# Patient Record
Sex: Male | Born: 1935 | Race: White | Hispanic: No | Marital: Married | Smoking: Former smoker
Health system: Southern US, Community
[De-identification: ages and names within clinical notes are randomized; demographics above are authoritative.]

## PROBLEM LIST (undated history)

## (undated) DIAGNOSIS — K219 Gastro-esophageal reflux disease without esophagitis: Secondary | ICD-10-CM

## (undated) DIAGNOSIS — N401 Enlarged prostate with lower urinary tract symptoms: Secondary | ICD-10-CM

## (undated) DIAGNOSIS — J449 Chronic obstructive pulmonary disease, unspecified: Secondary | ICD-10-CM

## (undated) DIAGNOSIS — M419 Scoliosis, unspecified: Secondary | ICD-10-CM

## (undated) DIAGNOSIS — M954 Acquired deformity of chest and rib: Secondary | ICD-10-CM

## (undated) DIAGNOSIS — I10 Essential (primary) hypertension: Secondary | ICD-10-CM

## (undated) DIAGNOSIS — J441 Chronic obstructive pulmonary disease with (acute) exacerbation: Secondary | ICD-10-CM

## (undated) DIAGNOSIS — I6350 Cerebral infarction due to unspecified occlusion or stenosis of unspecified cerebral artery: Secondary | ICD-10-CM

## (undated) DIAGNOSIS — I2510 Atherosclerotic heart disease of native coronary artery without angina pectoris: Secondary | ICD-10-CM

## (undated) DIAGNOSIS — I4891 Unspecified atrial fibrillation: Secondary | ICD-10-CM

## (undated) DIAGNOSIS — R269 Unspecified abnormalities of gait and mobility: Secondary | ICD-10-CM

## (undated) DIAGNOSIS — M1990 Unspecified osteoarthritis, unspecified site: Secondary | ICD-10-CM

## (undated) DIAGNOSIS — R5383 Other fatigue: Secondary | ICD-10-CM

## (undated) DIAGNOSIS — I739 Peripheral vascular disease, unspecified: Secondary | ICD-10-CM

## (undated) DIAGNOSIS — M4800 Spinal stenosis, site unspecified: Secondary | ICD-10-CM

## (undated) DIAGNOSIS — E039 Hypothyroidism, unspecified: Secondary | ICD-10-CM

## (undated) DIAGNOSIS — K279 Peptic ulcer, site unspecified, unspecified as acute or chronic, without hemorrhage or perforation: Secondary | ICD-10-CM

## (undated) DIAGNOSIS — I5022 Chronic systolic (congestive) heart failure: Secondary | ICD-10-CM

## (undated) DIAGNOSIS — R5381 Other malaise: Secondary | ICD-10-CM

## (undated) HISTORY — DX: Chronic obstructive pulmonary disease with (acute) exacerbation: J44.1

## (undated) HISTORY — DX: Gastro-esophageal reflux disease without esophagitis: K21.9

## (undated) HISTORY — DX: Acquired deformity of chest and rib: M95.4

## (undated) HISTORY — DX: Chronic systolic (congestive) heart failure: I50.22

## (undated) HISTORY — DX: Spinal stenosis, site unspecified: M48.00

## (undated) HISTORY — DX: Peripheral vascular disease, unspecified: I73.9

## (undated) HISTORY — PX: APPENDECTOMY: SHX54

## (undated) HISTORY — PX: TRANSURETHRAL RESECTION OF PROSTATE: SHX73

## (undated) HISTORY — DX: Other malaise: R53.81

## (undated) HISTORY — PX: TONSILLECTOMY: SHX5217

## (undated) HISTORY — DX: Unspecified osteoarthritis, unspecified site: M19.90

## (undated) HISTORY — DX: Peptic ulcer, site unspecified, unspecified as acute or chronic, without hemorrhage or perforation: K27.9

## (undated) HISTORY — DX: Essential (primary) hypertension: I10

## (undated) HISTORY — DX: Other fatigue: R53.83

## (undated) HISTORY — PX: HERNIA REPAIR: SHX51

## (undated) HISTORY — DX: Benign prostatic hyperplasia with lower urinary tract symptoms: N40.1

## (undated) HISTORY — DX: Hypothyroidism, unspecified: E03.9

## (undated) HISTORY — DX: Scoliosis, unspecified: M41.9

## (undated) HISTORY — PX: LUMBAR LAMINECTOMY: SHX95

## (undated) HISTORY — DX: Unspecified abnormalities of gait and mobility: R26.9

## (undated) HISTORY — DX: Atherosclerotic heart disease of native coronary artery without angina pectoris: I25.10

## (undated) HISTORY — DX: Cerebral infarction due to unspecified occlusion or stenosis of unspecified cerebral artery: I63.50

## (undated) HISTORY — DX: Chronic obstructive pulmonary disease, unspecified: J44.9

## (undated) HISTORY — DX: Unspecified atrial fibrillation: I48.91

## (undated) SURGERY — Surgical Case
Anesthesia: *Unknown

---

## 2000-04-06 HISTORY — PX: ABDOMINAL AORTIC ANEURYSM REPAIR: SHX42

## 2003-03-28 ENCOUNTER — Emergency Department (HOSPITAL_COMMUNITY): Admission: EM | Admit: 2003-03-28 | Discharge: 2003-03-28 | Payer: Self-pay | Admitting: Emergency Medicine

## 2003-04-07 HISTORY — PX: CARDIAC CATHETERIZATION: SHX172

## 2003-08-29 ENCOUNTER — Inpatient Hospital Stay (HOSPITAL_COMMUNITY): Admission: AD | Admit: 2003-08-29 | Discharge: 2003-09-01 | Payer: Self-pay | Admitting: Internal Medicine

## 2003-09-03 ENCOUNTER — Inpatient Hospital Stay (HOSPITAL_COMMUNITY): Admission: EM | Admit: 2003-09-03 | Discharge: 2003-09-06 | Payer: Self-pay | Admitting: *Deleted

## 2003-11-08 ENCOUNTER — Inpatient Hospital Stay (HOSPITAL_COMMUNITY): Admission: EM | Admit: 2003-11-08 | Discharge: 2003-11-10 | Payer: Self-pay

## 2004-02-13 ENCOUNTER — Ambulatory Visit: Payer: Self-pay | Admitting: Internal Medicine

## 2004-03-11 ENCOUNTER — Ambulatory Visit: Payer: Self-pay | Admitting: Internal Medicine

## 2004-03-25 ENCOUNTER — Ambulatory Visit: Payer: Self-pay | Admitting: Internal Medicine

## 2004-04-09 ENCOUNTER — Ambulatory Visit: Payer: Self-pay | Admitting: Internal Medicine

## 2004-04-18 ENCOUNTER — Ambulatory Visit: Payer: Self-pay | Admitting: Internal Medicine

## 2004-05-07 ENCOUNTER — Ambulatory Visit: Payer: Self-pay | Admitting: Internal Medicine

## 2004-06-05 ENCOUNTER — Ambulatory Visit: Payer: Self-pay | Admitting: Internal Medicine

## 2004-06-09 ENCOUNTER — Ambulatory Visit: Payer: Self-pay | Admitting: Internal Medicine

## 2004-07-01 ENCOUNTER — Ambulatory Visit: Payer: Self-pay | Admitting: Internal Medicine

## 2004-07-16 ENCOUNTER — Ambulatory Visit: Payer: Self-pay | Admitting: Internal Medicine

## 2004-08-05 ENCOUNTER — Ambulatory Visit: Payer: Self-pay | Admitting: Internal Medicine

## 2004-08-15 ENCOUNTER — Ambulatory Visit: Payer: Self-pay | Admitting: Internal Medicine

## 2004-08-18 ENCOUNTER — Ambulatory Visit: Payer: Self-pay | Admitting: Internal Medicine

## 2004-08-19 ENCOUNTER — Ambulatory Visit: Payer: Self-pay | Admitting: Internal Medicine

## 2004-08-28 ENCOUNTER — Ambulatory Visit: Payer: Self-pay | Admitting: Internal Medicine

## 2004-09-04 ENCOUNTER — Ambulatory Visit: Payer: Self-pay | Admitting: Internal Medicine

## 2004-09-15 ENCOUNTER — Ambulatory Visit: Payer: Self-pay | Admitting: Internal Medicine

## 2004-10-17 ENCOUNTER — Ambulatory Visit: Payer: Self-pay | Admitting: Internal Medicine

## 2004-10-23 ENCOUNTER — Ambulatory Visit: Payer: Self-pay | Admitting: Internal Medicine

## 2004-11-12 ENCOUNTER — Ambulatory Visit: Payer: Self-pay | Admitting: Internal Medicine

## 2004-11-19 ENCOUNTER — Ambulatory Visit: Payer: Self-pay | Admitting: Internal Medicine

## 2004-12-22 ENCOUNTER — Ambulatory Visit: Payer: Self-pay | Admitting: Internal Medicine

## 2005-01-09 ENCOUNTER — Ambulatory Visit: Payer: Self-pay | Admitting: Internal Medicine

## 2005-01-13 ENCOUNTER — Ambulatory Visit: Payer: Self-pay | Admitting: Internal Medicine

## 2005-02-05 ENCOUNTER — Ambulatory Visit: Payer: Self-pay | Admitting: Internal Medicine

## 2005-03-03 ENCOUNTER — Ambulatory Visit: Payer: Self-pay | Admitting: Internal Medicine

## 2005-03-16 ENCOUNTER — Ambulatory Visit: Payer: Self-pay | Admitting: Internal Medicine

## 2005-03-20 ENCOUNTER — Ambulatory Visit: Payer: Self-pay | Admitting: Family Medicine

## 2005-03-20 ENCOUNTER — Encounter: Payer: Self-pay | Admitting: Cardiology

## 2005-03-20 ENCOUNTER — Inpatient Hospital Stay (HOSPITAL_COMMUNITY): Admission: EM | Admit: 2005-03-20 | Discharge: 2005-03-21 | Payer: Self-pay | Admitting: Emergency Medicine

## 2005-03-20 ENCOUNTER — Ambulatory Visit: Payer: Self-pay | Admitting: Internal Medicine

## 2005-03-20 ENCOUNTER — Ambulatory Visit: Payer: Self-pay | Admitting: Cardiology

## 2005-03-24 ENCOUNTER — Ambulatory Visit: Payer: Self-pay | Admitting: Internal Medicine

## 2005-04-03 ENCOUNTER — Ambulatory Visit: Payer: Self-pay | Admitting: Internal Medicine

## 2005-04-07 ENCOUNTER — Ambulatory Visit: Payer: Self-pay | Admitting: Internal Medicine

## 2005-04-09 ENCOUNTER — Ambulatory Visit: Payer: Self-pay | Admitting: Internal Medicine

## 2005-04-14 ENCOUNTER — Ambulatory Visit: Payer: Self-pay | Admitting: Internal Medicine

## 2005-04-24 ENCOUNTER — Ambulatory Visit: Payer: Self-pay | Admitting: Internal Medicine

## 2005-04-29 ENCOUNTER — Ambulatory Visit: Payer: Self-pay | Admitting: Internal Medicine

## 2005-06-04 ENCOUNTER — Ambulatory Visit: Payer: Self-pay | Admitting: Internal Medicine

## 2005-07-03 ENCOUNTER — Ambulatory Visit: Payer: Self-pay | Admitting: Internal Medicine

## 2005-07-17 ENCOUNTER — Ambulatory Visit: Payer: Self-pay | Admitting: Internal Medicine

## 2005-08-04 ENCOUNTER — Ambulatory Visit: Payer: Self-pay | Admitting: Internal Medicine

## 2005-08-06 ENCOUNTER — Ambulatory Visit: Payer: Self-pay | Admitting: Internal Medicine

## 2005-08-25 ENCOUNTER — Ambulatory Visit: Payer: Self-pay | Admitting: Internal Medicine

## 2005-08-25 ENCOUNTER — Emergency Department (HOSPITAL_COMMUNITY): Admission: EM | Admit: 2005-08-25 | Discharge: 2005-08-25 | Payer: Self-pay | Admitting: Emergency Medicine

## 2005-08-28 ENCOUNTER — Ambulatory Visit: Payer: Self-pay | Admitting: Internal Medicine

## 2005-09-29 ENCOUNTER — Ambulatory Visit: Payer: Self-pay | Admitting: Internal Medicine

## 2005-10-27 ENCOUNTER — Ambulatory Visit: Payer: Self-pay | Admitting: Internal Medicine

## 2005-11-05 ENCOUNTER — Ambulatory Visit: Payer: Self-pay | Admitting: Family Medicine

## 2005-11-06 ENCOUNTER — Encounter: Admission: RE | Admit: 2005-11-06 | Discharge: 2005-11-06 | Payer: Self-pay | Admitting: Family Medicine

## 2005-11-23 ENCOUNTER — Ambulatory Visit: Payer: Self-pay | Admitting: Internal Medicine

## 2005-12-24 ENCOUNTER — Ambulatory Visit: Payer: Self-pay | Admitting: Internal Medicine

## 2006-01-14 ENCOUNTER — Ambulatory Visit: Payer: Self-pay | Admitting: Internal Medicine

## 2006-02-16 ENCOUNTER — Ambulatory Visit: Payer: Self-pay | Admitting: Internal Medicine

## 2006-03-02 ENCOUNTER — Ambulatory Visit: Payer: Self-pay | Admitting: Internal Medicine

## 2006-03-16 ENCOUNTER — Ambulatory Visit: Payer: Self-pay | Admitting: Internal Medicine

## 2006-05-10 ENCOUNTER — Ambulatory Visit: Payer: Self-pay | Admitting: Internal Medicine

## 2006-05-24 ENCOUNTER — Ambulatory Visit: Payer: Self-pay | Admitting: Internal Medicine

## 2006-06-23 ENCOUNTER — Ambulatory Visit: Payer: Self-pay | Admitting: Internal Medicine

## 2006-07-02 ENCOUNTER — Ambulatory Visit: Payer: Self-pay | Admitting: Internal Medicine

## 2006-07-02 LAB — CONVERTED CEMR LAB
ALT: 26 units/L (ref 0–40)
Albumin: 3.4 g/dL — ABNORMAL LOW (ref 3.5–5.2)
Alkaline Phosphatase: 65 units/L (ref 39–117)
BUN: 14 mg/dL (ref 6–23)
Basophils Absolute: 0 10*3/uL (ref 0.0–0.1)
Basophils Relative: 0.7 % (ref 0.0–1.0)
CO2: 34 meq/L — ABNORMAL HIGH (ref 19–32)
Calcium: 9 mg/dL (ref 8.4–10.5)
Creatinine, Ser: 0.7 mg/dL (ref 0.4–1.5)
GFR calc Af Amer: 138 mL/min
MCHC: 34.3 g/dL (ref 30.0–36.0)
Monocytes Relative: 8.1 % (ref 3.0–11.0)
Platelets: 156 10*3/uL (ref 150–400)
Potassium: 4.1 meq/L (ref 3.5–5.1)
RDW: 13.3 % (ref 11.5–14.6)
Total Bilirubin: 0.9 mg/dL (ref 0.3–1.2)
Total Protein: 6.2 g/dL (ref 6.0–8.3)

## 2006-07-16 ENCOUNTER — Ambulatory Visit: Payer: Self-pay | Admitting: Family Medicine

## 2006-07-21 ENCOUNTER — Ambulatory Visit: Payer: Self-pay | Admitting: Internal Medicine

## 2006-07-27 ENCOUNTER — Ambulatory Visit: Payer: Self-pay | Admitting: Internal Medicine

## 2006-08-26 ENCOUNTER — Ambulatory Visit: Payer: Self-pay | Admitting: Internal Medicine

## 2006-09-07 ENCOUNTER — Encounter: Admission: RE | Admit: 2006-09-07 | Discharge: 2006-10-06 | Payer: Self-pay | Admitting: Internal Medicine

## 2006-09-30 ENCOUNTER — Ambulatory Visit: Payer: Self-pay | Admitting: Internal Medicine

## 2006-09-30 ENCOUNTER — Encounter: Payer: Self-pay | Admitting: Internal Medicine

## 2006-09-30 DIAGNOSIS — J449 Chronic obstructive pulmonary disease, unspecified: Secondary | ICD-10-CM

## 2006-09-30 DIAGNOSIS — K279 Peptic ulcer, site unspecified, unspecified as acute or chronic, without hemorrhage or perforation: Secondary | ICD-10-CM | POA: Insufficient documentation

## 2006-09-30 DIAGNOSIS — I4891 Unspecified atrial fibrillation: Secondary | ICD-10-CM

## 2006-09-30 DIAGNOSIS — I10 Essential (primary) hypertension: Secondary | ICD-10-CM | POA: Insufficient documentation

## 2006-09-30 DIAGNOSIS — I739 Peripheral vascular disease, unspecified: Secondary | ICD-10-CM

## 2006-09-30 DIAGNOSIS — E039 Hypothyroidism, unspecified: Secondary | ICD-10-CM | POA: Insufficient documentation

## 2006-09-30 DIAGNOSIS — M1990 Unspecified osteoarthritis, unspecified site: Secondary | ICD-10-CM | POA: Insufficient documentation

## 2006-09-30 DIAGNOSIS — J4489 Other specified chronic obstructive pulmonary disease: Secondary | ICD-10-CM | POA: Insufficient documentation

## 2006-09-30 HISTORY — DX: Chronic obstructive pulmonary disease, unspecified: J44.9

## 2006-09-30 HISTORY — DX: Other specified chronic obstructive pulmonary disease: J44.89

## 2006-09-30 HISTORY — DX: Hypothyroidism, unspecified: E03.9

## 2006-09-30 HISTORY — DX: Peptic ulcer, site unspecified, unspecified as acute or chronic, without hemorrhage or perforation: K27.9

## 2006-09-30 HISTORY — DX: Unspecified osteoarthritis, unspecified site: M19.90

## 2006-09-30 HISTORY — DX: Peripheral vascular disease, unspecified: I73.9

## 2006-09-30 HISTORY — DX: Unspecified atrial fibrillation: I48.91

## 2006-09-30 HISTORY — DX: Essential (primary) hypertension: I10

## 2006-10-01 DIAGNOSIS — I2510 Atherosclerotic heart disease of native coronary artery without angina pectoris: Secondary | ICD-10-CM | POA: Insufficient documentation

## 2006-10-01 HISTORY — DX: Atherosclerotic heart disease of native coronary artery without angina pectoris: I25.10

## 2006-10-11 ENCOUNTER — Encounter: Payer: Self-pay | Admitting: Internal Medicine

## 2006-10-11 ENCOUNTER — Ambulatory Visit: Payer: Self-pay | Admitting: Internal Medicine

## 2006-11-02 ENCOUNTER — Ambulatory Visit: Payer: Self-pay | Admitting: Internal Medicine

## 2006-11-02 DIAGNOSIS — R300 Dysuria: Secondary | ICD-10-CM | POA: Insufficient documentation

## 2006-11-02 LAB — CONVERTED CEMR LAB
Bilirubin Urine: NEGATIVE
Glucose, Urine, Semiquant: NEGATIVE
Protein, U semiquant: NEGATIVE
Urobilinogen, UA: 0.2
pH: 6.5

## 2006-11-16 ENCOUNTER — Telehealth: Payer: Self-pay | Admitting: Internal Medicine

## 2006-12-02 ENCOUNTER — Encounter: Payer: Self-pay | Admitting: Internal Medicine

## 2006-12-15 ENCOUNTER — Ambulatory Visit: Payer: Self-pay | Admitting: Internal Medicine

## 2007-01-11 ENCOUNTER — Ambulatory Visit: Payer: Self-pay | Admitting: Internal Medicine

## 2007-01-11 DIAGNOSIS — M549 Dorsalgia, unspecified: Secondary | ICD-10-CM | POA: Insufficient documentation

## 2007-02-23 ENCOUNTER — Ambulatory Visit: Payer: Self-pay | Admitting: Internal Medicine

## 2007-03-24 ENCOUNTER — Ambulatory Visit: Payer: Self-pay | Admitting: Internal Medicine

## 2007-03-24 DIAGNOSIS — J441 Chronic obstructive pulmonary disease with (acute) exacerbation: Secondary | ICD-10-CM

## 2007-03-24 HISTORY — DX: Chronic obstructive pulmonary disease with (acute) exacerbation: J44.1

## 2007-04-19 ENCOUNTER — Ambulatory Visit: Payer: Self-pay | Admitting: Internal Medicine

## 2007-04-19 DIAGNOSIS — K219 Gastro-esophageal reflux disease without esophagitis: Secondary | ICD-10-CM

## 2007-04-19 HISTORY — DX: Gastro-esophageal reflux disease without esophagitis: K21.9

## 2007-04-25 ENCOUNTER — Encounter: Payer: Self-pay | Admitting: Internal Medicine

## 2007-05-24 ENCOUNTER — Ambulatory Visit: Payer: Self-pay | Admitting: Internal Medicine

## 2007-06-13 ENCOUNTER — Ambulatory Visit: Payer: Self-pay | Admitting: Internal Medicine

## 2007-06-13 DIAGNOSIS — R5381 Other malaise: Secondary | ICD-10-CM

## 2007-06-13 DIAGNOSIS — R5383 Other fatigue: Secondary | ICD-10-CM

## 2007-06-13 HISTORY — DX: Other malaise: R53.81

## 2007-07-11 ENCOUNTER — Ambulatory Visit: Payer: Self-pay | Admitting: Internal Medicine

## 2007-07-25 ENCOUNTER — Ambulatory Visit: Payer: Self-pay | Admitting: Internal Medicine

## 2007-08-30 ENCOUNTER — Telehealth: Payer: Self-pay | Admitting: Internal Medicine

## 2007-08-30 ENCOUNTER — Ambulatory Visit: Payer: Self-pay | Admitting: Internal Medicine

## 2007-09-26 ENCOUNTER — Ambulatory Visit: Payer: Self-pay | Admitting: Internal Medicine

## 2007-09-26 DIAGNOSIS — J069 Acute upper respiratory infection, unspecified: Secondary | ICD-10-CM | POA: Insufficient documentation

## 2007-12-23 ENCOUNTER — Ambulatory Visit: Payer: Self-pay | Admitting: Internal Medicine

## 2007-12-30 ENCOUNTER — Ambulatory Visit: Payer: Self-pay | Admitting: Internal Medicine

## 2008-01-13 ENCOUNTER — Ambulatory Visit: Payer: Self-pay | Admitting: Internal Medicine

## 2008-01-31 ENCOUNTER — Ambulatory Visit: Payer: Self-pay | Admitting: Internal Medicine

## 2008-02-21 ENCOUNTER — Telehealth: Payer: Self-pay | Admitting: Internal Medicine

## 2008-02-24 ENCOUNTER — Telehealth: Payer: Self-pay | Admitting: Internal Medicine

## 2008-02-29 ENCOUNTER — Ambulatory Visit: Payer: Self-pay | Admitting: Internal Medicine

## 2008-03-19 ENCOUNTER — Telehealth: Payer: Self-pay | Admitting: Internal Medicine

## 2008-03-20 ENCOUNTER — Ambulatory Visit: Payer: Self-pay | Admitting: Internal Medicine

## 2008-03-21 ENCOUNTER — Encounter: Payer: Self-pay | Admitting: Internal Medicine

## 2008-03-28 ENCOUNTER — Ambulatory Visit: Payer: Self-pay | Admitting: Internal Medicine

## 2008-04-02 ENCOUNTER — Ambulatory Visit: Payer: Self-pay | Admitting: Family Medicine

## 2008-04-17 ENCOUNTER — Ambulatory Visit: Payer: Self-pay | Admitting: Internal Medicine

## 2008-04-17 DIAGNOSIS — M25529 Pain in unspecified elbow: Secondary | ICD-10-CM

## 2008-04-20 ENCOUNTER — Ambulatory Visit: Payer: Self-pay | Admitting: Internal Medicine

## 2008-04-23 ENCOUNTER — Ambulatory Visit: Payer: Self-pay | Admitting: Internal Medicine

## 2008-04-23 LAB — CONVERTED CEMR LAB
ALT: 17 units/L (ref 0–53)
AST: 20 units/L (ref 0–37)
BUN: 21 mg/dL (ref 6–23)
Basophils Absolute: 0 10*3/uL (ref 0.0–0.1)
Basophils Relative: 0.7 % (ref 0.0–3.0)
CO2: 33 meq/L — ABNORMAL HIGH (ref 19–32)
Calcium: 9 mg/dL (ref 8.4–10.5)
Chloride: 100 meq/L (ref 96–112)
Creatinine, Ser: 0.7 mg/dL (ref 0.4–1.5)
Eosinophils Relative: 1.3 % (ref 0.0–5.0)
Glucose, Bld: 94 mg/dL (ref 70–99)
Hemoglobin: 14.4 g/dL (ref 13.0–17.0)
Lymphocytes Relative: 28.7 % (ref 12.0–46.0)
MCHC: 34.6 g/dL (ref 30.0–36.0)
Monocytes Relative: 7.5 % (ref 3.0–12.0)
Neutro Abs: 3.3 10*3/uL (ref 1.4–7.7)
Neutrophils Relative %: 61.8 % (ref 43.0–77.0)
RBC: 4.93 M/uL (ref 4.22–5.81)
TSH: 2.59 microintl units/mL (ref 0.35–5.50)
Total Bilirubin: 0.8 mg/dL (ref 0.3–1.2)
Total Protein: 6.1 g/dL (ref 6.0–8.3)
WBC: 5.3 10*3/uL (ref 4.5–10.5)

## 2008-04-24 ENCOUNTER — Telehealth: Payer: Self-pay | Admitting: Internal Medicine

## 2008-04-25 ENCOUNTER — Ambulatory Visit: Payer: Self-pay | Admitting: Internal Medicine

## 2008-06-01 ENCOUNTER — Ambulatory Visit: Payer: Self-pay | Admitting: Internal Medicine

## 2008-06-12 ENCOUNTER — Ambulatory Visit: Payer: Self-pay | Admitting: Internal Medicine

## 2008-06-18 ENCOUNTER — Encounter: Admission: RE | Admit: 2008-06-18 | Discharge: 2008-06-18 | Payer: Self-pay | Admitting: Family Medicine

## 2008-06-28 ENCOUNTER — Ambulatory Visit: Payer: Self-pay | Admitting: Internal Medicine

## 2008-07-03 ENCOUNTER — Ambulatory Visit: Payer: Self-pay | Admitting: Internal Medicine

## 2008-07-06 ENCOUNTER — Ambulatory Visit: Payer: Self-pay | Admitting: Cardiology

## 2008-07-06 ENCOUNTER — Ambulatory Visit: Payer: Self-pay | Admitting: Internal Medicine

## 2008-07-06 ENCOUNTER — Telehealth: Payer: Self-pay | Admitting: Internal Medicine

## 2008-07-06 ENCOUNTER — Inpatient Hospital Stay (HOSPITAL_COMMUNITY): Admission: EM | Admit: 2008-07-06 | Discharge: 2008-07-13 | Payer: Self-pay | Admitting: Emergency Medicine

## 2008-07-09 ENCOUNTER — Encounter: Payer: Self-pay | Admitting: Internal Medicine

## 2008-07-13 ENCOUNTER — Encounter: Payer: Self-pay | Admitting: Internal Medicine

## 2008-07-24 ENCOUNTER — Telehealth: Payer: Self-pay | Admitting: Internal Medicine

## 2008-07-27 ENCOUNTER — Ambulatory Visit: Payer: Self-pay | Admitting: Internal Medicine

## 2008-08-03 ENCOUNTER — Telehealth: Payer: Self-pay | Admitting: Family Medicine

## 2008-08-06 ENCOUNTER — Encounter: Payer: Self-pay | Admitting: Internal Medicine

## 2008-08-06 ENCOUNTER — Inpatient Hospital Stay (HOSPITAL_COMMUNITY): Admission: EM | Admit: 2008-08-06 | Discharge: 2008-08-09 | Payer: Self-pay | Admitting: Emergency Medicine

## 2008-08-06 ENCOUNTER — Ambulatory Visit: Payer: Self-pay | Admitting: Internal Medicine

## 2008-08-06 DIAGNOSIS — K5669 Other intestinal obstruction: Secondary | ICD-10-CM

## 2008-08-06 DIAGNOSIS — I5022 Chronic systolic (congestive) heart failure: Secondary | ICD-10-CM | POA: Insufficient documentation

## 2008-08-06 HISTORY — DX: Chronic systolic (congestive) heart failure: I50.22

## 2008-08-07 ENCOUNTER — Encounter: Payer: Self-pay | Admitting: Internal Medicine

## 2008-08-11 ENCOUNTER — Ambulatory Visit: Payer: Self-pay | Admitting: Internal Medicine

## 2008-08-11 DIAGNOSIS — R0602 Shortness of breath: Secondary | ICD-10-CM | POA: Insufficient documentation

## 2008-08-13 ENCOUNTER — Ambulatory Visit: Payer: Self-pay | Admitting: Internal Medicine

## 2008-08-14 ENCOUNTER — Ambulatory Visit: Payer: Self-pay | Admitting: Internal Medicine

## 2008-08-20 ENCOUNTER — Encounter (INDEPENDENT_AMBULATORY_CARE_PROVIDER_SITE_OTHER): Payer: Self-pay | Admitting: Internal Medicine

## 2008-08-20 ENCOUNTER — Encounter: Payer: Self-pay | Admitting: Emergency Medicine

## 2008-08-20 ENCOUNTER — Telehealth: Payer: Self-pay | Admitting: Internal Medicine

## 2008-08-20 ENCOUNTER — Inpatient Hospital Stay (HOSPITAL_COMMUNITY): Admission: EM | Admit: 2008-08-20 | Discharge: 2008-08-23 | Payer: Self-pay | Admitting: Internal Medicine

## 2008-08-20 ENCOUNTER — Ambulatory Visit: Payer: Self-pay | Admitting: Internal Medicine

## 2008-08-20 ENCOUNTER — Ambulatory Visit: Payer: Self-pay | Admitting: Cardiovascular Disease

## 2008-08-21 ENCOUNTER — Encounter (INDEPENDENT_AMBULATORY_CARE_PROVIDER_SITE_OTHER): Payer: Self-pay | Admitting: Internal Medicine

## 2008-08-22 ENCOUNTER — Encounter: Payer: Self-pay | Admitting: Cardiology

## 2008-08-22 ENCOUNTER — Encounter: Payer: Self-pay | Admitting: Internal Medicine

## 2008-08-23 ENCOUNTER — Encounter: Payer: Self-pay | Admitting: Internal Medicine

## 2008-08-28 ENCOUNTER — Ambulatory Visit: Payer: Self-pay | Admitting: Internal Medicine

## 2008-08-28 DIAGNOSIS — I6350 Cerebral infarction due to unspecified occlusion or stenosis of unspecified cerebral artery: Secondary | ICD-10-CM

## 2008-08-28 HISTORY — DX: Cerebral infarction due to unspecified occlusion or stenosis of unspecified cerebral artery: I63.50

## 2008-08-31 ENCOUNTER — Ambulatory Visit: Payer: Self-pay | Admitting: Internal Medicine

## 2008-08-31 LAB — CONVERTED CEMR LAB: Prothrombin Time: 12.3 s

## 2008-09-07 ENCOUNTER — Ambulatory Visit: Payer: Self-pay | Admitting: Internal Medicine

## 2008-09-07 LAB — CONVERTED CEMR LAB: INR: 4.7

## 2008-09-14 ENCOUNTER — Ambulatory Visit: Payer: Self-pay | Admitting: Internal Medicine

## 2008-09-21 ENCOUNTER — Encounter: Payer: Self-pay | Admitting: Internal Medicine

## 2008-09-25 ENCOUNTER — Encounter: Payer: Self-pay | Admitting: Internal Medicine

## 2008-09-28 ENCOUNTER — Ambulatory Visit: Payer: Self-pay | Admitting: Internal Medicine

## 2008-09-28 DIAGNOSIS — R269 Unspecified abnormalities of gait and mobility: Secondary | ICD-10-CM

## 2008-09-28 HISTORY — DX: Unspecified abnormalities of gait and mobility: R26.9

## 2008-10-02 ENCOUNTER — Encounter: Payer: Self-pay | Admitting: Internal Medicine

## 2008-10-02 ENCOUNTER — Telehealth: Payer: Self-pay | Admitting: Internal Medicine

## 2008-10-12 ENCOUNTER — Encounter: Payer: Self-pay | Admitting: Internal Medicine

## 2008-10-18 ENCOUNTER — Ambulatory Visit: Payer: Self-pay | Admitting: Internal Medicine

## 2008-10-18 LAB — CONVERTED CEMR LAB
INR: 1.8
Prothrombin Time: 16.4 s

## 2008-10-19 ENCOUNTER — Encounter: Payer: Self-pay | Admitting: Internal Medicine

## 2008-10-24 ENCOUNTER — Ambulatory Visit: Payer: Self-pay | Admitting: Internal Medicine

## 2008-10-30 ENCOUNTER — Encounter: Payer: Self-pay | Admitting: Internal Medicine

## 2008-11-07 ENCOUNTER — Ambulatory Visit: Payer: Self-pay | Admitting: Internal Medicine

## 2008-11-07 LAB — CONVERTED CEMR LAB: Prothrombin Time: 16.7 s

## 2008-11-20 ENCOUNTER — Ambulatory Visit: Payer: Self-pay | Admitting: Internal Medicine

## 2008-11-23 ENCOUNTER — Ambulatory Visit: Payer: Self-pay | Admitting: Internal Medicine

## 2008-11-23 LAB — CONVERTED CEMR LAB
BUN: 21 mg/dL (ref 6–23)
Calcium: 8.7 mg/dL (ref 8.4–10.5)
Glucose, Bld: 96 mg/dL (ref 70–99)
Sodium: 137 meq/L (ref 135–145)

## 2008-11-26 ENCOUNTER — Ambulatory Visit: Payer: Self-pay | Admitting: Family Medicine

## 2008-11-26 ENCOUNTER — Ambulatory Visit: Payer: Self-pay | Admitting: Cardiovascular Disease

## 2008-11-26 DIAGNOSIS — R51 Headache: Secondary | ICD-10-CM | POA: Insufficient documentation

## 2008-11-26 DIAGNOSIS — R519 Headache, unspecified: Secondary | ICD-10-CM | POA: Insufficient documentation

## 2008-12-07 ENCOUNTER — Ambulatory Visit: Payer: Self-pay | Admitting: Internal Medicine

## 2008-12-07 LAB — CONVERTED CEMR LAB
INR: 2.5
Prothrombin Time: 19.3 s

## 2008-12-11 ENCOUNTER — Encounter: Payer: Self-pay | Admitting: Internal Medicine

## 2009-01-02 ENCOUNTER — Ambulatory Visit: Payer: Self-pay | Admitting: Internal Medicine

## 2009-01-14 ENCOUNTER — Ambulatory Visit: Payer: Self-pay | Admitting: Internal Medicine

## 2009-01-14 DIAGNOSIS — M954 Acquired deformity of chest and rib: Secondary | ICD-10-CM

## 2009-01-14 HISTORY — DX: Acquired deformity of chest and rib: M95.4

## 2009-01-17 ENCOUNTER — Ambulatory Visit: Payer: Self-pay | Admitting: Internal Medicine

## 2009-02-12 ENCOUNTER — Ambulatory Visit: Payer: Self-pay | Admitting: Internal Medicine

## 2009-02-12 DIAGNOSIS — R110 Nausea: Secondary | ICD-10-CM

## 2009-02-12 LAB — CONVERTED CEMR LAB
Albumin: 3.4 g/dL — ABNORMAL LOW (ref 3.5–5.2)
Alkaline Phosphatase: 68 units/L (ref 39–117)
Basophils Absolute: 0 10*3/uL (ref 0.0–0.1)
Bilirubin, Direct: 0.1 mg/dL (ref 0.0–0.3)
CO2: 36 meq/L — ABNORMAL HIGH (ref 19–32)
Calcium: 9.1 mg/dL (ref 8.4–10.5)
Creatinine, Ser: 0.9 mg/dL (ref 0.4–1.5)
Eosinophils Absolute: 0.2 10*3/uL (ref 0.0–0.7)
GFR calc non Af Amer: 84.46 mL/min (ref >60)
Glucose, Bld: 103 mg/dL — ABNORMAL HIGH (ref 70–99)
INR: 2.1
Lymphocytes Relative: 32.5 % (ref 12.0–46.0)
MCHC: 33.8 g/dL (ref 30.0–36.0)
Neutrophils Relative %: 54.8 % (ref 43.0–77.0)
RDW: 15.4 % — ABNORMAL HIGH (ref 11.5–14.6)
Total Bilirubin: 0.9 mg/dL (ref 0.3–1.2)

## 2009-02-26 ENCOUNTER — Ambulatory Visit: Payer: Self-pay | Admitting: Internal Medicine

## 2009-02-26 LAB — CONVERTED CEMR LAB
CO2: 34 meq/L — ABNORMAL HIGH (ref 19–32)
Chloride: 101 meq/L (ref 96–112)
GFR calc non Af Amer: 96.75 mL/min (ref >60)
Glucose, Bld: 95 mg/dL (ref 70–99)
Potassium: 4.4 meq/L (ref 3.5–5.1)
Sodium: 139 meq/L (ref 135–145)

## 2009-04-03 ENCOUNTER — Inpatient Hospital Stay (HOSPITAL_COMMUNITY): Admission: EM | Admit: 2009-04-03 | Discharge: 2009-04-05 | Payer: Self-pay | Admitting: Emergency Medicine

## 2009-04-03 ENCOUNTER — Telehealth: Payer: Self-pay | Admitting: Internal Medicine

## 2009-04-04 ENCOUNTER — Encounter (INDEPENDENT_AMBULATORY_CARE_PROVIDER_SITE_OTHER): Payer: Self-pay | Admitting: Neurology

## 2009-04-04 ENCOUNTER — Ambulatory Visit: Payer: Self-pay | Admitting: Cardiovascular Disease

## 2009-04-06 HISTORY — PX: ELBOW SURGERY: SHX618

## 2009-04-19 ENCOUNTER — Ambulatory Visit: Payer: Self-pay | Admitting: Internal Medicine

## 2009-05-16 ENCOUNTER — Ambulatory Visit: Payer: Self-pay | Admitting: Internal Medicine

## 2009-05-16 LAB — CONVERTED CEMR LAB
Basophils Absolute: 0 10*3/uL (ref 0.0–0.1)
Eosinophils Relative: 2.1 % (ref 0.0–5.0)
GFR calc non Af Amer: 134.77 mL/min (ref >60)
Glucose, Bld: 96 mg/dL (ref 70–99)
HCT: 40.1 % (ref 39.0–52.0)
Hemoglobin: 13.2 g/dL (ref 13.0–17.0)
Lymphs Abs: 0.8 10*3/uL (ref 0.7–4.0)
Monocytes Relative: 13.3 % — ABNORMAL HIGH (ref 3.0–12.0)
Neutro Abs: 2.7 10*3/uL (ref 1.4–7.7)
Potassium: 4.1 meq/L (ref 3.5–5.1)
RDW: 12.9 % (ref 11.5–14.6)
Sodium: 138 meq/L (ref 135–145)

## 2009-05-17 ENCOUNTER — Telehealth: Payer: Self-pay | Admitting: Internal Medicine

## 2009-05-18 ENCOUNTER — Inpatient Hospital Stay (HOSPITAL_COMMUNITY): Admission: EM | Admit: 2009-05-18 | Discharge: 2009-05-20 | Payer: Self-pay | Admitting: Emergency Medicine

## 2009-05-21 ENCOUNTER — Telehealth: Payer: Self-pay | Admitting: Internal Medicine

## 2009-05-21 ENCOUNTER — Encounter: Payer: Self-pay | Admitting: Internal Medicine

## 2009-05-22 ENCOUNTER — Ambulatory Visit: Payer: Self-pay | Admitting: Internal Medicine

## 2009-05-28 ENCOUNTER — Ambulatory Visit: Payer: Self-pay | Admitting: Internal Medicine

## 2009-06-05 ENCOUNTER — Ambulatory Visit: Payer: Self-pay | Admitting: Family Medicine

## 2009-06-05 DIAGNOSIS — R42 Dizziness and giddiness: Secondary | ICD-10-CM

## 2009-06-06 ENCOUNTER — Ambulatory Visit: Payer: Self-pay | Admitting: Internal Medicine

## 2009-06-10 ENCOUNTER — Ambulatory Visit: Payer: Self-pay | Admitting: Internal Medicine

## 2009-06-10 LAB — CONVERTED CEMR LAB
Basophils Absolute: 0 10*3/uL (ref 0.0–0.1)
Calcium: 8.5 mg/dL (ref 8.4–10.5)
Eosinophils Absolute: 0.1 10*3/uL (ref 0.0–0.7)
GFR calc non Af Amer: 84.4 mL/min (ref >60)
HCT: 38.5 % — ABNORMAL LOW (ref 39.0–52.0)
Lymphs Abs: 1.5 10*3/uL (ref 0.7–4.0)
MCHC: 33.1 g/dL (ref 30.0–36.0)
MCV: 86.2 fL (ref 78.0–100.0)
Monocytes Absolute: 0.5 10*3/uL (ref 0.1–1.0)
Platelets: 168 10*3/uL (ref 150.0–400.0)
RDW: 12.2 % (ref 11.5–14.6)
Sodium: 139 meq/L (ref 135–145)

## 2009-06-13 ENCOUNTER — Ambulatory Visit: Payer: Self-pay | Admitting: Internal Medicine

## 2009-06-21 ENCOUNTER — Ambulatory Visit: Payer: Self-pay | Admitting: Internal Medicine

## 2009-06-21 DIAGNOSIS — R109 Unspecified abdominal pain: Secondary | ICD-10-CM | POA: Insufficient documentation

## 2009-07-15 ENCOUNTER — Ambulatory Visit: Payer: Self-pay | Admitting: Internal Medicine

## 2009-07-15 DIAGNOSIS — R3915 Urgency of urination: Secondary | ICD-10-CM

## 2009-07-15 LAB — CONVERTED CEMR LAB
Bilirubin Urine: NEGATIVE
Ketones, urine, test strip: NEGATIVE
Specific Gravity, Urine: <1.005

## 2009-08-08 ENCOUNTER — Telehealth: Payer: Self-pay | Admitting: Internal Medicine

## 2009-08-09 ENCOUNTER — Ambulatory Visit: Payer: Self-pay | Admitting: Internal Medicine

## 2009-08-09 DIAGNOSIS — M79609 Pain in unspecified limb: Secondary | ICD-10-CM

## 2009-08-09 LAB — CONVERTED CEMR LAB
Basophils Absolute: 0 10*3/uL (ref 0.0–0.1)
Calcium: 9.1 mg/dL (ref 8.4–10.5)
Chloride: 99 meq/L (ref 96–112)
Eosinophils Absolute: 0.1 10*3/uL (ref 0.0–0.7)
Eosinophils Relative: 1.6 % (ref 0.0–5.0)
GFR calc non Af Amer: 127.32 mL/min (ref >60)
Glucose, Bld: 91 mg/dL (ref 70–99)
INR: 1.5
MCV: 84.7 fL (ref 78.0–100.0)
Magnesium: 2.1 mg/dL (ref 1.5–2.5)
Monocytes Absolute: 0.4 10*3/uL (ref 0.1–1.0)
Neutrophils Relative %: 66 % (ref 43.0–77.0)
Platelets: 151 10*3/uL (ref 150.0–400.0)
Prothrombin Time: 15.2 s
RDW: 15 % — ABNORMAL HIGH (ref 11.5–14.6)
WBC: 5.4 10*3/uL (ref 4.5–10.5)

## 2009-08-12 ENCOUNTER — Ambulatory Visit: Payer: Self-pay | Admitting: Internal Medicine

## 2009-08-13 ENCOUNTER — Telehealth: Payer: Self-pay | Admitting: Internal Medicine

## 2009-08-15 ENCOUNTER — Ambulatory Visit: Payer: Self-pay | Admitting: Internal Medicine

## 2009-08-20 ENCOUNTER — Encounter: Payer: Self-pay | Admitting: Internal Medicine

## 2009-08-21 ENCOUNTER — Encounter: Payer: Self-pay | Admitting: Internal Medicine

## 2009-08-21 DIAGNOSIS — M4800 Spinal stenosis, site unspecified: Secondary | ICD-10-CM

## 2009-08-21 HISTORY — DX: Spinal stenosis, site unspecified: M48.00

## 2009-08-23 ENCOUNTER — Encounter: Payer: Self-pay | Admitting: Internal Medicine

## 2009-08-26 ENCOUNTER — Encounter: Payer: Self-pay | Admitting: Internal Medicine

## 2009-08-26 ENCOUNTER — Ambulatory Visit: Payer: Self-pay

## 2009-08-27 ENCOUNTER — Telehealth: Payer: Self-pay | Admitting: Internal Medicine

## 2009-08-30 ENCOUNTER — Ambulatory Visit: Payer: Self-pay | Admitting: Internal Medicine

## 2009-08-30 LAB — CONVERTED CEMR LAB
Glucose, Urine, Semiquant: NEGATIVE
Nitrite: NEGATIVE
Specific Gravity, Urine: 1.01
WBC Urine, dipstick: NEGATIVE
pH: 7

## 2009-09-03 ENCOUNTER — Encounter: Admission: RE | Admit: 2009-09-03 | Discharge: 2009-09-03 | Payer: Self-pay | Admitting: Internal Medicine

## 2009-09-03 ENCOUNTER — Ambulatory Visit: Payer: Self-pay | Admitting: Internal Medicine

## 2009-09-04 ENCOUNTER — Ambulatory Visit: Payer: Self-pay | Admitting: Internal Medicine

## 2009-09-04 DIAGNOSIS — N401 Enlarged prostate with lower urinary tract symptoms: Secondary | ICD-10-CM

## 2009-09-04 DIAGNOSIS — F980 Enuresis not due to a substance or known physiological condition: Secondary | ICD-10-CM

## 2009-09-04 DIAGNOSIS — N138 Other obstructive and reflux uropathy: Secondary | ICD-10-CM

## 2009-09-04 HISTORY — DX: Other obstructive and reflux uropathy: N13.8

## 2009-09-04 HISTORY — DX: Benign prostatic hyperplasia with lower urinary tract symptoms: N40.1

## 2009-09-04 LAB — CONVERTED CEMR LAB
Nitrite: NEGATIVE
Urobilinogen, UA: 0.2
WBC Urine, dipstick: NEGATIVE

## 2009-09-10 ENCOUNTER — Telehealth: Payer: Self-pay | Admitting: Internal Medicine

## 2009-09-13 ENCOUNTER — Encounter: Payer: Self-pay | Admitting: Internal Medicine

## 2009-09-16 ENCOUNTER — Ambulatory Visit: Payer: Self-pay | Admitting: Internal Medicine

## 2009-09-20 ENCOUNTER — Ambulatory Visit: Payer: Self-pay | Admitting: Internal Medicine

## 2009-09-22 ENCOUNTER — Inpatient Hospital Stay (HOSPITAL_COMMUNITY): Admission: EM | Admit: 2009-09-22 | Discharge: 2009-09-25 | Payer: Self-pay | Admitting: Internal Medicine

## 2009-09-24 ENCOUNTER — Ambulatory Visit: Payer: Self-pay | Admitting: Physical Medicine & Rehabilitation

## 2009-09-27 ENCOUNTER — Ambulatory Visit: Payer: Self-pay | Admitting: Internal Medicine

## 2009-09-30 ENCOUNTER — Telehealth: Payer: Self-pay | Admitting: Internal Medicine

## 2009-10-01 ENCOUNTER — Ambulatory Visit: Payer: Self-pay | Admitting: Internal Medicine

## 2009-10-01 DIAGNOSIS — R319 Hematuria, unspecified: Secondary | ICD-10-CM | POA: Insufficient documentation

## 2009-10-01 LAB — CONVERTED CEMR LAB
Blood in Urine, dipstick: NEGATIVE
Ketones, urine, test strip: NEGATIVE
Nitrite: NEGATIVE
WBC Urine, dipstick: NEGATIVE
pH: 7

## 2009-10-03 ENCOUNTER — Ambulatory Visit: Payer: Self-pay | Admitting: Internal Medicine

## 2009-10-04 ENCOUNTER — Telehealth: Payer: Self-pay | Admitting: Internal Medicine

## 2009-10-04 ENCOUNTER — Encounter: Payer: Self-pay | Admitting: Internal Medicine

## 2009-10-08 ENCOUNTER — Telehealth: Payer: Self-pay | Admitting: Family Medicine

## 2009-10-08 ENCOUNTER — Telehealth: Payer: Self-pay | Admitting: Internal Medicine

## 2009-10-10 ENCOUNTER — Telehealth: Payer: Self-pay

## 2009-10-14 ENCOUNTER — Telehealth: Payer: Self-pay | Admitting: Internal Medicine

## 2009-10-14 ENCOUNTER — Ambulatory Visit: Payer: Self-pay | Admitting: Internal Medicine

## 2009-10-14 LAB — CONVERTED CEMR LAB
Glucose, Urine, Semiquant: NEGATIVE
Nitrite: NEGATIVE
Urobilinogen, UA: 0.2
pH: 6

## 2009-10-15 ENCOUNTER — Ambulatory Visit: Payer: Self-pay | Admitting: Internal Medicine

## 2009-10-16 ENCOUNTER — Telehealth: Payer: Self-pay | Admitting: Internal Medicine

## 2009-10-18 ENCOUNTER — Encounter: Payer: Self-pay | Admitting: Internal Medicine

## 2009-10-21 ENCOUNTER — Ambulatory Visit: Payer: Self-pay | Admitting: Internal Medicine

## 2009-10-21 LAB — CONVERTED CEMR LAB: INR: 1.3

## 2009-10-24 ENCOUNTER — Ambulatory Visit: Payer: Self-pay | Admitting: Internal Medicine

## 2009-10-25 ENCOUNTER — Encounter: Payer: Self-pay | Admitting: Internal Medicine

## 2009-10-25 ENCOUNTER — Telehealth: Payer: Self-pay | Admitting: Internal Medicine

## 2009-10-25 ENCOUNTER — Telehealth: Payer: Self-pay

## 2009-11-06 ENCOUNTER — Ambulatory Visit: Payer: Self-pay | Admitting: Internal Medicine

## 2009-11-11 ENCOUNTER — Telehealth (INDEPENDENT_AMBULATORY_CARE_PROVIDER_SITE_OTHER): Payer: Self-pay | Admitting: *Deleted

## 2009-11-13 ENCOUNTER — Ambulatory Visit: Payer: Self-pay | Admitting: Internal Medicine

## 2009-11-13 LAB — CONVERTED CEMR LAB: INR: 2.2

## 2009-11-20 ENCOUNTER — Ambulatory Visit: Payer: Self-pay | Admitting: Internal Medicine

## 2009-11-26 ENCOUNTER — Ambulatory Visit: Payer: Self-pay | Admitting: Internal Medicine

## 2009-11-28 ENCOUNTER — Telehealth: Payer: Self-pay | Admitting: Internal Medicine

## 2009-11-29 ENCOUNTER — Ambulatory Visit: Payer: Self-pay | Admitting: Internal Medicine

## 2009-12-04 ENCOUNTER — Encounter: Payer: Self-pay | Admitting: Internal Medicine

## 2009-12-05 ENCOUNTER — Ambulatory Visit: Payer: Self-pay | Admitting: Internal Medicine

## 2009-12-05 LAB — CONVERTED CEMR LAB
Basophils Relative: 0.4 % (ref 0.0–3.0)
CO2: 31 meq/L (ref 19–32)
Calcium: 8.6 mg/dL (ref 8.4–10.5)
Creatinine, Ser: 0.5 mg/dL (ref 0.4–1.5)
Eosinophils Relative: 0.8 % (ref 0.0–5.0)
Hemoglobin: 12.1 g/dL — ABNORMAL LOW (ref 13.0–17.0)
Lymphocytes Relative: 16.9 % (ref 12.0–46.0)
Monocytes Relative: 7.3 % (ref 3.0–12.0)
Neutro Abs: 4 10*3/uL (ref 1.4–7.7)
Neutrophils Relative %: 74.6 % (ref 43.0–77.0)
RBC: 4.25 M/uL (ref 4.22–5.81)
Sodium: 137 meq/L (ref 135–145)
WBC: 5.3 10*3/uL (ref 4.5–10.5)

## 2009-12-06 ENCOUNTER — Emergency Department (HOSPITAL_COMMUNITY): Admission: EM | Admit: 2009-12-06 | Discharge: 2009-12-07 | Payer: Self-pay | Admitting: Emergency Medicine

## 2009-12-10 ENCOUNTER — Ambulatory Visit: Payer: Self-pay | Admitting: Internal Medicine

## 2010-01-13 ENCOUNTER — Ambulatory Visit: Payer: Self-pay | Admitting: Internal Medicine

## 2010-01-15 ENCOUNTER — Telehealth: Payer: Self-pay | Admitting: Internal Medicine

## 2010-01-23 ENCOUNTER — Ambulatory Visit: Payer: Self-pay | Admitting: Internal Medicine

## 2010-01-29 ENCOUNTER — Telehealth: Payer: Self-pay | Admitting: Internal Medicine

## 2010-01-30 ENCOUNTER — Telehealth: Payer: Self-pay | Admitting: Internal Medicine

## 2010-01-31 ENCOUNTER — Ambulatory Visit: Payer: Self-pay | Admitting: Internal Medicine

## 2010-02-03 ENCOUNTER — Encounter: Payer: Self-pay | Admitting: Internal Medicine

## 2010-02-05 ENCOUNTER — Ambulatory Visit: Payer: Self-pay | Admitting: Internal Medicine

## 2010-02-11 ENCOUNTER — Ambulatory Visit: Payer: Self-pay | Admitting: Internal Medicine

## 2010-02-17 ENCOUNTER — Ambulatory Visit: Payer: Self-pay | Admitting: Internal Medicine

## 2010-02-17 DIAGNOSIS — T07XXXA Unspecified multiple injuries, initial encounter: Secondary | ICD-10-CM

## 2010-02-20 ENCOUNTER — Ambulatory Visit: Payer: Self-pay | Admitting: Internal Medicine

## 2010-02-20 DIAGNOSIS — T148XXA Other injury of unspecified body region, initial encounter: Secondary | ICD-10-CM | POA: Insufficient documentation

## 2010-02-21 ENCOUNTER — Telehealth: Payer: Self-pay | Admitting: Internal Medicine

## 2010-02-24 ENCOUNTER — Ambulatory Visit: Payer: Self-pay | Admitting: Internal Medicine

## 2010-02-26 ENCOUNTER — Encounter: Payer: Self-pay | Admitting: Internal Medicine

## 2010-03-13 ENCOUNTER — Ambulatory Visit: Payer: Self-pay | Admitting: Internal Medicine

## 2010-03-13 ENCOUNTER — Encounter: Payer: Self-pay | Admitting: Internal Medicine

## 2010-03-16 ENCOUNTER — Encounter: Payer: Self-pay | Admitting: Internal Medicine

## 2010-03-16 ENCOUNTER — Inpatient Hospital Stay (HOSPITAL_COMMUNITY)
Admission: EM | Admit: 2010-03-16 | Discharge: 2010-03-18 | Payer: Self-pay | Source: Home / Self Care | Attending: Internal Medicine | Admitting: Internal Medicine

## 2010-03-19 ENCOUNTER — Telehealth: Payer: Self-pay | Admitting: Internal Medicine

## 2010-03-20 ENCOUNTER — Telehealth: Payer: Self-pay | Admitting: Internal Medicine

## 2010-03-22 ENCOUNTER — Ambulatory Visit: Payer: Self-pay | Admitting: Family Medicine

## 2010-03-24 ENCOUNTER — Telehealth: Payer: Self-pay | Admitting: Internal Medicine

## 2010-03-28 ENCOUNTER — Ambulatory Visit: Payer: Self-pay | Admitting: Internal Medicine

## 2010-04-01 ENCOUNTER — Telehealth: Payer: Self-pay

## 2010-04-02 ENCOUNTER — Ambulatory Visit
Admission: RE | Admit: 2010-04-02 | Discharge: 2010-04-02 | Payer: Self-pay | Source: Home / Self Care | Attending: Internal Medicine | Admitting: Internal Medicine

## 2010-04-02 ENCOUNTER — Observation Stay (HOSPITAL_COMMUNITY)
Admission: EM | Admit: 2010-04-02 | Discharge: 2010-04-03 | Payer: Self-pay | Source: Home / Self Care | Attending: Internal Medicine | Admitting: Internal Medicine

## 2010-04-09 ENCOUNTER — Encounter: Payer: Self-pay | Admitting: Internal Medicine

## 2010-04-14 ENCOUNTER — Encounter: Payer: Self-pay | Admitting: Internal Medicine

## 2010-04-16 ENCOUNTER — Emergency Department (HOSPITAL_COMMUNITY)
Admission: EM | Admit: 2010-04-16 | Discharge: 2010-04-16 | Payer: Self-pay | Source: Home / Self Care | Admitting: Emergency Medicine

## 2010-04-16 ENCOUNTER — Telehealth: Payer: Self-pay | Admitting: Internal Medicine

## 2010-04-17 ENCOUNTER — Ambulatory Visit
Admission: RE | Admit: 2010-04-17 | Discharge: 2010-04-17 | Payer: Self-pay | Source: Home / Self Care | Attending: Internal Medicine | Admitting: Internal Medicine

## 2010-04-17 DIAGNOSIS — R350 Frequency of micturition: Secondary | ICD-10-CM | POA: Insufficient documentation

## 2010-04-17 LAB — CONVERTED CEMR LAB
Bilirubin Urine: NEGATIVE
Glucose, Urine, Semiquant: NEGATIVE
Ketones, urine, test strip: NEGATIVE
Specific Gravity, Urine: 1.025
Urobilinogen, UA: 0.2
pH: 6

## 2010-04-22 ENCOUNTER — Inpatient Hospital Stay (HOSPITAL_COMMUNITY)
Admission: EM | Admit: 2010-04-22 | Discharge: 2010-04-23 | Payer: Self-pay | Source: Home / Self Care | Attending: Internal Medicine | Admitting: Internal Medicine

## 2010-04-28 LAB — COMPREHENSIVE METABOLIC PANEL
ALT: 16 U/L (ref 0–53)
AST: 22 U/L (ref 0–37)
Alkaline Phosphatase: 63 U/L (ref 39–117)
CO2: 30 mEq/L (ref 19–32)
Chloride: 97 mEq/L (ref 96–112)
GFR calc non Af Amer: >60 mL/min (ref >60)
Glucose, Bld: 116 mg/dL — ABNORMAL HIGH (ref 70–99)
Potassium: 4.4 mEq/L (ref 3.5–5.1)
Sodium: 134 mEq/L — ABNORMAL LOW (ref 135–145)
Total Bilirubin: 0.8 mg/dL (ref 0.3–1.2)

## 2010-04-28 LAB — URINALYSIS, ROUTINE W REFLEX MICROSCOPIC
Bilirubin Urine: NEGATIVE
Hgb urine dipstick: NEGATIVE
Ketones, ur: NEGATIVE mg/dL
Urobilinogen, UA: 1 mg/dL (ref 0.0–1.0)

## 2010-04-28 LAB — CBC
HCT: 40.6 % (ref 39.0–52.0)
MCH: 27.7 pg (ref 26.0–34.0)
MCHC: 32 g/dL (ref 30.0–36.0)
MCV: 86.6 fL (ref 78.0–100.0)
RDW: 14.4 % (ref 11.5–15.5)

## 2010-04-28 LAB — DIFFERENTIAL
Basophils Relative: 0 % (ref 0–1)
Lymphocytes Relative: 10 % — ABNORMAL LOW (ref 12–46)
Lymphs Abs: 1 10*3/uL (ref 0.7–4.0)
Neutro Abs: 7.7 10*3/uL (ref 1.7–7.7)
Neutrophils Relative %: 81 % — ABNORMAL HIGH (ref 43–77)

## 2010-04-28 LAB — POCT I-STAT, CHEM 8
BUN: 15 mg/dL (ref 6–23)
Calcium, Ion: 1.13 mmol/L (ref 1.12–1.32)
Chloride: 97 mEq/L (ref 96–112)
Creatinine, Ser: 0.8 mg/dL (ref 0.4–1.5)
Glucose, Bld: 117 mg/dL — ABNORMAL HIGH (ref 70–99)
HCT: 41 % (ref 39.0–52.0)
Hemoglobin: 13.9 g/dL (ref 13.0–17.0)
Potassium: 4.3 mEq/L (ref 3.5–5.1)
Sodium: 132 mEq/L — ABNORMAL LOW (ref 135–145)

## 2010-04-28 LAB — LACTIC ACID, PLASMA: Lactic Acid, Venous: 0.9 mmol/L (ref 0.5–2.2)

## 2010-04-28 LAB — POCT CARDIAC MARKERS: Myoglobin, poc: 59.7 ng/mL (ref 12–200)

## 2010-04-28 NOTE — Discharge Summary (Addendum)
[name], [name]         ACCOUNT NO.:  [id number]  MEDICAL RECORD NO.:  [id number]          PATIENT TYPE:  INP  LOCATION:  0105                         FACILITY:  [facility]  PHYSICIAN:  Erick Blinks, MD     DATE OF BIRTH:  [birth year]  DATE OF ADMISSION:  04/22/2010 DATE OF DISCHARGE:  04/23/2010                              DISCHARGE SUMMARY   PRIMARY CARE PHYSICIAN:  Gordy Savers, MD, of [place].  DISCHARGE DIAGNOSES: 1. Transient nausea and vomiting, improved. 2. Constipation with fecal impaction, improved. 3. History of atrial fibrillation. 4. Hypertension. 5. Peripheral vascular disease. 6. Coronary artery disease. 7. Congestive heart failure. 8. Hypothyroidism.  DISCHARGE MEDICATIONS: 1. Levothyroxine 50 mcg p.o. daily. 2. Omeprazole 20 mg p.o. daily. 3. Ativan 0.5 mg p.o. b.i.d. p.r.n. 4. Sublingual nitroglycerin 0.4 mg p.o. q.5 hours p.r.n. chest pain. 5. Symbicort 80/1.5 two puffs p.o. b.i.d. 6. Atrovent 1 nebulizer t.i.d. as needed. 7. Lasix 40 mg half a tablet p.o. q.a.m. Monday, Wednesday, and Friday     only. 8. Flomax 0.4 mg p.o. daily. 9. Potassium chloride 10 mEq p.o. daily. 10.Coreg 3.125 mg p.o. b.i.d. 11.Tramadol 50 mg p.o. q.6 hours p.r.n. 12.Avodart 0.5 mg p.o. daily. 13.Fentanyl patch 25 mcg topical q.72 hours. 14.Plavix 75 mg p.o. daily. 15.MiraLax 17 g p.o. daily. 16.Digoxin 0.125 mg p.o. daily.  ADMISSION HISTORY:  This is a 77 year old gentleman who presented to the ER with complaints of nausea and vomiting x4, on the day prior to admission.  He reported that his last bowel movement was the night prior to admission and then he did have some diarrhea which was loose, although he still felt constipated.  He denied any bright red blood per rectum or black stool.  He was evaluated in the emergency room and had a CT abdomen which showed colonic dilatation, related to possibly fecal impaction to the rectum, and slightly prominent  wall enhancement of small bowel in the pelvis.  He was subsequently referred for admission, please refer to the history and physical dictated by Dr. Orvan Falconer on January 18th.  HOSPITAL COURSE:  The patient was initially admitted from the emergency room.  While in the emergency room, he clinically improved.  He was tolerating an oral diet.  He was having bowel movements and his abdominal pain has significantly improved.  He was not having any further vomiting.  The patient was requesting to be discharged home.  He was ambulatory and felt that he would be more comfortable at home. Again his abdominal pain had improved and he was having bowel movements and he was also tolerating p.o.  His labs were reviewed and found to be within normal limits.  It is appropriate to have the patient discharged today to follow up with his primary care physician within the next 1 week.  He is advised to take his MiraLax on a daily basis.  He was otherwise stable for discharge home.  The patient was otherwise stable for discharge home from the emergency room. He was advised to return to the emergency room if he had recurrence of his abdominal pain, any fever, or any change in his bowel habits.  Plan was discussed with the patient who was also in agreement.  DIAGNOSTIC IMAGING: 1. Chest x-ray from January 18th shows stable examination with chronic     lung disease and atherosclerosis, as described.  No acute findings. 2. CT of abdomen and pelvis on January 18th shows interval improvement     in pleural effusions with resolution of left lower lobe airspace     disease.  New colonic dilatation maybe related to fecal impaction     of the rectum.  There is prominent wall enhancement of the small     bowel in the pelvis, which could be a manifestation of mesenteric     ischemia.  No large vessel occlusion is identified.  Pelvic ascites     without focal extraluminal fluid or air collection.  Stable      postsurgical changes of lumbar spine and aorta.  DISCHARGE INSTRUCTIONS:  The patient should follow up with his primary care physician within next 1 week.  He should continue on a heart- healthy, low-calorie diet.  Conduct his activity as tolerated.     Erick Blinks, MD     JM/MEDQ  D:  04/23/2010  T:  04/23/2010  Job:  [id number]  cc:   Gordy Savers, MD [address] [place] [address]  Electronically Signed by Erick Blinks  on 04/28/2010 12:33:27 PM

## 2010-05-04 LAB — CONVERTED CEMR LAB
CK-MB: 4.5 ng/mL (ref 0.3–4.0)
Troponin I: 0.05 ng/mL (ref <0.06)

## 2010-05-05 ENCOUNTER — Ambulatory Visit
Admission: RE | Admit: 2010-05-05 | Discharge: 2010-05-05 | Payer: Self-pay | Source: Home / Self Care | Attending: Internal Medicine | Admitting: Internal Medicine

## 2010-05-06 ENCOUNTER — Telehealth: Payer: Self-pay | Admitting: Internal Medicine

## 2010-05-06 NOTE — Progress Notes (Signed)
Summary: pain in legs  Phone Note Call from Patient   Caller: Patient Call For: Corey Savers  MD Reason for Call: Talk to Doctor Summary of Call: 2nd call today r/t to pain in legs getting worse. what to do?   please advise - appt? Initial call taken by: Duard Brady LPN,  October 25, 2009 2:13 PM  Follow-up for Phone Call        per dr Kirtland Bouchard - to see , appt made for today. KIK Follow-up by: Duard Brady LPN,  October 25, 2009 3:49 PM

## 2010-05-06 NOTE — Progress Notes (Signed)
Summary: OT ext  Phone Note From Other Clinic   Caller: [phone] LM susan davis, [facility] Summary of Call: Occupational therapy goals not reached yet, but orders ended.  Want continuation of home health OT orders.  Verbal OK. Initial call taken by: [name], RN,  October 14, 2009 2:54 PM  Follow-up for Phone Call        OK Follow-up by: Gordy Savers  MD,  October 14, 2009 3:20 PM  Additional Follow-up for Phone Call Additional follow up Details #1::        Left message to inform. Additional Follow-up by: [name], RN,  October 14, 2009 3:42 PM

## 2010-05-06 NOTE — Progress Notes (Signed)
Summary: Fri night saw blood in urine  Phone Note Call from Patient Call back at [facility] Phone [phone]   Summary of Call: Fri night had blood in urine & some pain.  Cath had been removed Mon.  No more blood seen since.  Coumadin 2.5mg  daily & 5mg  Sun.  Feels fine.  Has fu appt tomorrow, but thought he'd check in today with Dr. Kirtland Bouchard by phone.   Initial call taken by: [name], RN,  September 30, 2009 9:16 AM  Follow-up for Phone Call        will check u/a [place]arrow Follow-up by: Gordy Savers  MD,  September 30, 2009 12:52 PM  Additional Follow-up for Phone Call Additional follow up Details #1::        Phone Call Completed Additional Follow-up by: [name], RN,  September 30, 2009 1:19 PM

## 2010-05-06 NOTE — Progress Notes (Signed)
Summary: Stop Coumadin?  Phone Note Call from Patient   Caller: Patient Call For: Gordy Savers  MD Summary of Call: Pt has no appetite on Coumadin and wants to try Plavix instead.  He has lost 36 lbs. [phone] Initial call taken by: [name] CMA,  November 11, 2009 10:37 AM  Follow-up for Phone Call        This decision should be made by her primary physician and she should stay on Coumadin until she discusses with Dr Kirtland Bouchard.  She could schedule follow up when he is back to discuss. Follow-up by: Evelena Peat MD,  November 11, 2009 1:20 PM  Additional Follow-up for Phone Call Additional follow up Details #1::        Pt. notified. Additional Follow-up by: [name] CMA,  November 11, 2009 1:37 PM

## 2010-05-06 NOTE — Assessment & Plan Note (Signed)
Summary: pt fell this wkend/has cut around ring finger/pt sore/cjr   Vital Signs:  Patient profile:   77 year old male Temp:     97.7 degrees F oral BP sitting:   110 / 68  (right arm) Cuff size:   regular  Vitals Entered By: Duard Brady LPN (February 17, 2010 11:12 AM) CC: fell at bathroom door on saturday - no LOC , (R) hand skin tear Is Patient Diabetic? No   Primary Care [place]:  Gordy Savers  MD  CC:  fell at bathroom door on saturday - no LOC  and (R) hand skin tear.  History of Present Illness: 77 year old patient who has multiple medical problems, including advanced cardiovascular and pulmonary disease.  He tripped over the weekend sustaining soft tissue trauma and skin tears to the dorsal aspect of his right hand and nose.  There was no syncope.  His cardiopulmonary status fairly stable  Allergies: 1)  ! Novocain (Procaine Hcl)  Physical Exam  General:  Well-developed,well-nourished,in no acute distress; alert,appropriate and cooperative throughout examination Neck:  no neck vein distention Lungs:  diminished breath sounds, but clear Heart:  controlled ventricular response Skin:  a skin tear was noted involving the dorsal last part of his right hand.  A superficial abrasion was noted involving the distal nose region.  These areas were cleaned, dressed   Impression & Recommendations:  Problem # 1:  CONTUSIONS, MULTIPLE (ICD-924.8)  Problem # 2:  CHRONIC SYSTOLIC HEART FAILURE (ICD-428.22)  His updated medication list for this problem includes:    Furosemide 40 Mg Tabs (Furosemide) ..... One half a tablet in the morning  monday wednesday, friday only    Coreg 3.125 Mg Tabs (Carvedilol) ..... One   tablet twice daily    Plavix 75 Mg Tabs (Clopidogrel bisulfate) ..... One daily  Complete Medication List: 1)  Levothroid 50 Mcg Tabs (Levothyroxine sodium) .Marland Kitchen.. 1 once daily 2)  Eql Omeprazole 20 Mg Tbec (Omeprazole) .Marland Kitchen.. 1 once daily 3)  Lorazepam 0.5  Mg Tabs (Lorazepam) .Marland Kitchen.. 1 two times a day as needed 4)  Nitroquick 0.4 Mg Subl (Nitroglycerin) .... Use as dir as needed 5)  Symbicort 80-4.5 Mcg/act Aero (Budesonide-formoterol fumarate) .... Two puffs twice daily as needed 6)  Ipratropium-albuterol 0.5-2.5 (3) Mg/41ml Soln (Ipratropium-albuterol) .... Use three times daily as needed 7)  Furosemide 40 Mg Tabs (Furosemide) .... One half a tablet in the morning  monday wednesday, friday only 8)  Tamsulosin Hcl 0.4 Mg Caps (Tamsulosin hcl) .... One daily 9)  Klor-con 10 10 Meq Cr-tabs (Potassium chloride) .... One daily 10)  Coreg 3.125 Mg Tabs (Carvedilol) .... One   tablet twice daily 11)  Tramadol Hcl 50 Mg Tabs (Tramadol hcl) .... One every 6 hours as needed for pain 12)  Avodart 0.5 Mg Caps (Dutasteride) .... One daily 13)  Fentanyl 25 Mcg/hr Pt72 (Fentanyl) .... Use every 72 hours 14)  Plavix 75 Mg Tabs (Clopidogrel bisulfate) .... One daily 15)  Miralax Powd (Polyethylene glycol 3350) .... One scoop in 8 ounces of water once or twice daily for constipation 16)  Ciprofloxacin Hcl 500 Mg Tabs (Ciprofloxacin hcl) .... One twice daily  Patient Instructions: 1)  Please schedule a follow-up appointment in 3 months. 2)  Limit your Sodium (Salt). 3)  It is important that you exercise regularly at least 20 minutes 5 times a week. If you develop chest pain, have severe difficulty breathing, or feel very tired , stop exercising immediately and seek medical attention.  Orders Added: 1)  Est. Patient Level III [81859]

## 2010-05-06 NOTE — Assessment & Plan Note (Signed)
Summary: 1 week fup//ccm-cough--ok per kim//ccm   Vital Signs:  Patient profile:   77 year old male Weight:      128 pounds Temp:     97.8 degrees F oral BP sitting:   108 / 62  (right arm)  Vitals Entered By: Duard Brady LPN (May 22, 2009 10:56 AM) CC: dc'd from [facility] monday - c/o congested cough, weakness , no better   Primary Care Provider:  Gordy Savers  MD  CC:  dc'd from [facility] - c/o congested cough, weakness , and no better.  History of Present Illness: 77 year old patient who is seen today as following a hospital discharge for community-acquired pneumonia.  He does have a history of advanced COPD and chronic atrial fibrillation.  He has completed azithromycin, and is completing the a cephalosporin antibiotic.  He has improved, but still having significant cough.  He is eating better.hospital records reviewed. Cough presently is largely nonproductive.  He denies any fever, chills, or worsening shortness of breath  Allergies: 1)  ! Novocain (Procaine Hcl)  Past History:  Past Medical History: Reviewed history from 09/14/2008 and no changes required. Atrial fibrillation COPD Hypertension Hypothyroidism Peptic ulcer disease Peripheral vascular disease DJD Congestive heart failure Coronary artery disease left brain Cardioembolic stroke May 2010  Past Surgical History: Reviewed history from 08/11/2008 and no changes required. Appendectomy Inguinal herniorrhaphy Tonsillectomy Abdominal aortic aneurysm repair Lumbar laminectomy Transurethral resection of prostate   Review of Systems       The patient complains of dyspnea on exertion and prolonged cough.  The patient denies anorexia, fever, weight loss, weight gain, vision loss, decreased hearing, hoarseness, chest pain, syncope, peripheral edema, headaches, hemoptysis, abdominal pain, melena, hematochezia, severe indigestion/heartburn, hematuria, incontinence, genital sores, muscle weakness,  suspicious skin lesions, transient blindness, difficulty walking, depression, unusual weight change, abnormal bleeding, enlarged lymph nodes, angioedema, breast masses, and testicular masses.    Physical Exam  General:  underweight appearing.  frequent paroxysms of coughing, but in no acute distress Head:  Normocephalic and atraumatic without obvious abnormalities. No apparent alopecia or balding. Eyes:  No corneal or conjunctival inflammation noted. EOMI. Perrla. Funduscopic exam benign, without hemorrhages, exudates or papilledema. Vision grossly normal. Mouth:  Oral mucosa and oropharynx without lesions or exudates.  Teeth in good repair. Neck:  No deformities, masses, or tenderness noted. Lungs:  showing diminished breath sounds, but clear.  O2 saturation 94% Heart:  irregular with a controlled ventricular response Abdomen:  Bowel sounds positive,abdomen soft and non-tender without masses, organomegaly or hernias noted. Extremities:  no edema Skin:  Intact without suspicious lesions or rashes   Impression & Recommendations:  Problem # 1:  CHRONIC OBSTRUCTIVE PULMONARY DISEASE, ACUTE EXACERBATION (ICD-491.21)  Problem # 2:  HYPERTENSION (ICD-401.9)  His updated medication list for this problem includes:    Furosemide 40 Mg Tabs (Furosemide) ..... One half a tablet in the morning  Problem # 3:  ATRIAL FIBRILLATION (ICD-427.31)  His updated medication list for this problem includes:    Anacin 81 Mg Tbec (Aspirin) ..... One daily    Warfarin Sodium 2.5 Mg Tabs (Warfarin sodium) ..... One daily  Problem # 4:  COPD (ICD-496)  His updated medication list for this problem includes:    Symbicort 80-4.5 Mcg/act Aero (Budesonide-formoterol fumarate) .Marland Kitchen..Marland Kitchen Two puffs twice daily as needed    Ipratropium-albuterol 0.5-2.5 (3) Mg/44ml Soln (Ipratropium-albuterol) ..... Use three times daily as needed will complete antibiotic therapy  Complete Medication List: 1)  Levothroid 50 Mcg Tabs  (  Levothyroxine sodium) .Marland Kitchen.. 1 once daily 2)  Eql Omeprazole 20 Mg Tbec (Omeprazole) .Marland Kitchen.. 1 once daily 3)  Flomax 0.4 Mg Cp24 (Tamsulosin hcl) .Marland Kitchen.. 1 once daily 4)  Lorazepam 0.5 Mg Tabs (Lorazepam) .Marland Kitchen.. 1 two times a day as needed 5)  Nitroquick 0.4 Mg Subl (Nitroglycerin) .... Use as dir as needed 6)  Symbicort 80-4.5 Mcg/act Aero (Budesonide-formoterol fumarate) .... Two puffs twice daily as needed 7)  Carvedilol 6.25 Tabs (carvedilol)  .... One tablet   twice daily 8)  Anacin 81 Mg Tbec (Aspirin) .... One daily 9)  Warfarin Sodium 2.5 Mg Tabs (Warfarin sodium) .... One daily 10)  Ipratropium-albuterol 0.5-2.5 (3) Mg/8ml Soln (Ipratropium-albuterol) .... Use three times daily as needed 11)  Furosemide 40 Mg Tabs (Furosemide) .... One half a tablet in the morning 12)  Androgel Pump 1 % Gel (Testosterone) .... Use daily 13)  Tamsulosin Hcl 0.4 Mg Caps (Tamsulosin hcl) .... One daily 14)  Klor-con 10 10 Meq Cr-tabs (Potassium chloride) .... One daily 15)  Hydrocodone-homatropine 5-1.5 Mg/56ml Syrp (Hydrocodone-homatropine) .... 0.5-teaspoon every 6 hours as needed for cough  Patient Instructions: 1)  Get plenty of rest, drink lots of clear liquids, and use Tylenol or Ibuprofen for fever and comfort. Return in 7-10 days if you're not better:sooner if you're feeling worse. 2)  Take your antibiotic as prescribed until ALL of it is gone, but stop if you develop a rash or swelling and contact our office as soon as possible. Prescriptions: HYDROCODONE-HOMATROPINE 5-1.5 MG/5ML SYRP (HYDROCODONE-HOMATROPINE) 0.5-teaspoon every 6 hours as needed for cough  #4 oz x 0   Entered and Authorized by:   Gordy Savers  MD   Signed by:   Gordy Savers  MD on 05/22/2009   Method used:   Print then Give to Patient   RxID:   [id number]

## 2010-05-06 NOTE — Assessment & Plan Note (Signed)
Summary: pain in both legs/cjr   Vital Signs:  Patient profile:   77 year old male Weight:      125 pounds Temp:     98.2 degrees F oral BP sitting:   148 / 70  (right arm) Cuff size:   regular  Vitals Entered By: Duard Brady LPN (November 06, 2009 11:00 AM) CC: c/o leg pain getting worse Is Patient Diabetic? No   Primary Care [place]:  Gordy Savers  MD  CC:  c/o leg pain getting worse.  History of Present Illness: 77 year old patient who is seen today for follow-up of his spinal stenosis.  He continues to have episodic severe leg pain.  He got very low sleep last night due to pain.  He is on low-dose fentanyl as well as p.r.n., hydrocodone.  He has tolerated medication well without the sedation, confusion, and constipation, etc.  Preventive Screening-Counseling & Management  Alcohol-Tobacco     Smoking Status: never  Allergies: 1)  ! Novocain (Procaine Hcl)  Physical Exam  General:  Well-developed,well-nourished,in no acute distress; alert,appropriate and cooperative throughout examination Extremities:  no edema or ischemic changes   Impression & Recommendations:  Problem # 1:  SPINAL STENOSIS (ICD-724.00)  Problem # 2:  LEG PAIN, BILATERAL (ICD-729.5)  Complete Medication List: 1)  Levothroid 50 Mcg Tabs (Levothyroxine sodium) .Marland Kitchen.. 1 once daily 2)  Eql Omeprazole 20 Mg Tbec (Omeprazole) .Marland Kitchen.. 1 once daily 3)  Lorazepam 0.5 Mg Tabs (Lorazepam) .Marland Kitchen.. 1 two times a day as needed 4)  Nitroquick 0.4 Mg Subl (Nitroglycerin) .... Use as dir as needed 5)  Symbicort 80-4.5 Mcg/act Aero (Budesonide-formoterol fumarate) .... Two puffs twice daily as needed 6)  Anacin 81 Mg Tbec (Aspirin) .... One daily 7)  Warfarin Sodium 2.5 Mg Tabs (Warfarin sodium) .... One daily 8)  Ipratropium-albuterol 0.5-2.5 (3) Mg/43ml Soln (Ipratropium-albuterol) .... Use three times daily as needed 9)  Furosemide 40 Mg Tabs (Furosemide) .... One half a tablet in the morning  monday  wednesday, friday only 10)  Tamsulosin Hcl 0.4 Mg Caps (Tamsulosin hcl) .... One daily 11)  Klor-con 10 10 Meq Cr-tabs (Potassium chloride) .... One daily 12)  Coreg 3.125 Mg Tabs (Carvedilol) .... One half tablet twice daily 13)  Tramadol Hcl 50 Mg Tabs (Tramadol hcl) .... One every 6 hours as needed for pain 14)  Hydrocodone-acetaminophen 5-500 Mg Tabs (Hydrocodone-acetaminophen) .... One every 6 hours for pain 15)  Ipratropium Bromide 0.02 % Soln (Ipratropium bromide) .... Use two times a day 16)  Avodart 0.5 Mg Caps (Dutasteride) .... One daily 17)  Fentanyl 25 Mcg/hr Pt72 (Fentanyl) .... Use every 72 hours  Other Orders: Protime ([id number])  Patient Instructions: 1)  Please schedule a follow-up appointment in 1 month. 2)  Limit your Sodium (Salt) to less than 2 grams a day(slightly less than 1/2 a teaspoon) to prevent fluid retention, swelling, or worsening of symptoms. Prescriptions: FENTANYL 25 MCG/HR PT72 (FENTANYL) use every 72 hours  #12 x 0   Entered and Authorized by:   Gordy Savers  MD   Signed by:   Gordy Savers  MD on 11/06/2009   Method used:   Print then Give to Patient   RxID:   [id number]   Appended Document: pain in both legs/cjr  Laboratory Results   Blood Tests      INR: 1.9   (Normal Range: 0.88-1.12   Therap INR: 2.0-3.5) Comments: [name]  November 06, 2009 1:05 PM  ANTICOAGULATION RECORD PREVIOUS REGIMEN & LAB RESULTS Anticoagulation Diagnosis:  V58.83,V58.61,427.31 on  08/09/2009 Previous INR Goal Range:  2.0-3.0 on  08/09/2009 Previous INR:  1.3 on  10/21/2009 Previous Coumadin Dose(mg):  5mg  only on wed, then 2.5mg  x 6 days  on  08/09/2009 Previous Regimen:  5mg  on m,w,f 2.5mg  on other days on  10/21/2009  NEW REGIMEN & LAB RESULTS Current INR: 1.9 Regimen: same  Repeat testing in: 4 weeks  Anticoagulation Visit Questionnaire Coumadin dose missed/changed:  No Abnormal Bleeding Symptoms:  No  Any diet  changes including alcohol intake, vegetables or greens since the last visit:  No Any illnesses or hospitalizations since the last visit:  No Any signs of clotting since the last visit (including chest discomfort, dizziness, shortness of breath, arm tingling, slurred speech, swelling or redness in leg):  No  MEDICATIONS LEVOTHROID 50 MCG  TABS (LEVOTHYROXINE SODIUM) 1 once daily EQL OMEPRAZOLE 20 MG  TBEC (OMEPRAZOLE) 1 once daily LORAZEPAM 0.5 MG  TABS (LORAZEPAM) 1 two times a day as needed NITROQUICK 0.4 MG  SUBL (NITROGLYCERIN) use as dir as needed SYMBICORT 80-4.5 MCG/ACT  AERO (BUDESONIDE-FORMOTEROL FUMARATE) two puffs twice daily as needed ANACIN 81 MG TBEC (ASPIRIN) one daily WARFARIN SODIUM 2.5 MG TABS (WARFARIN SODIUM) one daily IPRATROPIUM-ALBUTEROL 0.5-2.5 (3) MG/3ML SOLN (IPRATROPIUM-ALBUTEROL) Use three times daily as needed FUROSEMIDE 40 MG TABS (FUROSEMIDE) one half a tablet in the morning  Monday Wednesday, Friday only TAMSULOSIN HCL 0.4 MG CAPS (TAMSULOSIN HCL) one daily KLOR-CON 10 10 MEQ CR-TABS (POTASSIUM CHLORIDE) one daily COREG 3.125 MG TABS (CARVEDILOL) one half tablet twice daily TRAMADOL HCL 50 MG TABS (TRAMADOL HCL) one every 6 hours as needed for pain HYDROCODONE-ACETAMINOPHEN 5-500 MG TABS (HYDROCODONE-ACETAMINOPHEN) one every 6 hours for pain IPRATROPIUM BROMIDE 0.02 % SOLN (IPRATROPIUM BROMIDE) Use two times a day AVODART 0.5 MG CAPS (DUTASTERIDE) one daily FENTANYL 25 MCG/HR PT72 (FENTANYL) use every 72 hours

## 2010-05-06 NOTE — Assessment & Plan Note (Signed)
Summary: pt no better/njr   Vital Signs:  Patient profile:   77 year old male Weight:      125 pounds Temp:     97.7 degrees F BP sitting:   120 / 74  (right arm) Cuff size:   regular  Vitals Entered By: Duard Brady LPN (June 10, 1608 1:10 PM) CC: c/o nausea - thinks it may be r/t coumadin -  Is Patient Diabetic? No   Primary Care Provider:  Gordy Savers  MD  CC:  c/o nausea - thinks it may be r/t coumadin - .  History of Present Illness: 77 year old patient who is seen today for follow-up.  he was seen here 5 days ago with the hypotension and multiple medications were held.  He has been discharged from hospital recently for a community-acquired left lower lobe pneumonia.  His chief complaint today is nausea.  He states that the only medication he took yesterday was his Coumadin.  he is receiving home physical therapy.  He is asking about discontinuation of the Coumadin due to his nausea  Allergies: 1)  ! Novocain (Procaine Hcl)  Past History:  Past Medical History: Reviewed history from 09/14/2008 and no changes required. Atrial fibrillation COPD Hypertension Hypothyroidism Peptic ulcer disease Peripheral vascular disease DJD Congestive heart failure Coronary artery disease left brain Cardioembolic stroke May 2010  Review of Systems       The patient complains of anorexia and muscle weakness.  The patient denies fever, weight loss, weight gain, vision loss, decreased hearing, hoarseness, chest pain, syncope, dyspnea on exertion, peripheral edema, prolonged cough, headaches, hemoptysis, abdominal pain, melena, hematochezia, severe indigestion/heartburn, hematuria, incontinence, genital sores, suspicious skin lesions, transient blindness, difficulty walking, depression, unusual weight change, abnormal bleeding, enlarged lymph nodes, angioedema, breast masses, and testicular masses.    Physical Exam  General:  elderly frail, no acute distress.  Blood  pressure 130/80 Head:  Normocephalic and atraumatic without obvious abnormalities. No apparent alopecia or balding. Eyes:  No corneal or conjunctival inflammation noted. EOMI. Perrla. Funduscopic exam benign, without hemorrhages, exudates or papilledema. Vision grossly normal. Mouth:  Oral mucosa and oropharynx without lesions or exudates.  Teeth in good repair. Neck:  No deformities, masses, or tenderness noted. Lungs:  rales were noted involving the left base only Heart:  irregular rhythm, with a controlled ventricular response Abdomen:  Bowel sounds positive,abdomen soft and non-tender without masses, organomegaly or hernias noted. Extremities:  no edema   Impression & Recommendations:  Problem # 1:  ORTHOSTATIC DIZZINESS (ICD-780.4) his hypotension has resolved.  Will resume his medications at a half-strength  Problem # 2:  NAUSEA (ICD-787.02) unclear etiology.  Will continue PPI and observe at this point and continue symptomatic treatment  Problem # 3:  COUMADIN THERAPY (ICD-V58.61)  Problem # 4:  ATRIAL FIBRILLATION (ICD-427.31)  His updated medication list for this problem includes:    Anacin 81 Mg Tbec (Aspirin) ..... One daily    Warfarin Sodium 2.5 Mg Tabs (Warfarin sodium) ..... One daily    Coreg 3.125 Mg Tabs (Carvedilol) ..... One half tablet twice daily  His updated medication list for this problem includes:    Anacin 81 Mg Tbec (Aspirin) ..... One daily    Warfarin Sodium 2.5 Mg Tabs (Warfarin sodium) ..... One daily    Coreg 3.125 Mg Tabs (Carvedilol) ..... One half tablet twice daily  Complete Medication List: 1)  Levothroid 50 Mcg Tabs (Levothyroxine sodium) .Marland Kitchen.. 1 once daily 2)  Eql Omeprazole 20 Mg  Tbec (Omeprazole) .Marland Kitchen.. 1 once daily 3)  Flomax 0.4 Mg Cp24 (Tamsulosin hcl) .Marland Kitchen.. 1 once daily 4)  Lorazepam 0.5 Mg Tabs (Lorazepam) .Marland Kitchen.. 1 two times a day as needed 5)  Nitroquick 0.4 Mg Subl (Nitroglycerin) .... Use as dir as needed 6)  Symbicort 80-4.5 Mcg/act  Aero (Budesonide-formoterol fumarate) .... Two puffs twice daily as needed 7)  Anacin 81 Mg Tbec (Aspirin) .... One daily 8)  Warfarin Sodium 2.5 Mg Tabs (Warfarin sodium) .... One daily 9)  Ipratropium-albuterol 0.5-2.5 (3) Mg/33ml Soln (Ipratropium-albuterol) .... Use three times daily as needed 10)  Furosemide 40 Mg Tabs (Furosemide) .... One half a tablet in the morning  monday wednesday, friday only 11)  Androgel Pump 1 % Gel (Testosterone) .... Use daily 12)  Tamsulosin Hcl 0.4 Mg Caps (Tamsulosin hcl) .... One daily 13)  Klor-con 10 10 Meq Cr-tabs (Potassium chloride) .... One daily 14)  Coreg 3.125 Mg Tabs (Carvedilol) .... One half tablet twice daily  Patient Instructions: 1)  Please schedule a follow-up appointment in 2 weeks. 2)  Limit your Sodium (Salt). Prescriptions: COREG 3.125 MG TABS (CARVEDILOL) one half tablet twice daily  #90 x 4   Entered and Authorized by:   Gordy Savers  MD   Signed by:   Gordy Savers  MD on 06/10/2009   Method used:   Print then Give to Patient   RxID:   [id number] COREG 3.125 MG TABS (CARVEDILOL) one half tablet twice daily  #90 x 4   Entered and Authorized by:   Gordy Savers  MD   Signed by:   Gordy Savers  MD on 06/10/2009   Method used:   Electronically to        [facility]  [phone] (retail)       [address] [place], [place]  [address]       Ph: [phone] or [phone]       Fax: [phone]   RxID:   [id number]

## 2010-05-06 NOTE — Assessment & Plan Note (Signed)
Summary: leg pain//ccm   Vital Signs:  Patient profile:   77 year old male Weight:      131 pounds Temp:     97.7 degrees F oral BP sitting:   128 / 80  (left arm) Cuff size:   regular  Vitals Entered By: Duard Brady LPN (Aug 09, 1608 11:02 AM) CC: c/o leg pain x 3-4 days Is Patient Diabetic? No   Primary Care Provider:  Gordy Savers  MD  CC:  c/o leg pain x 3-4 days.  History of Present Illness: 36 -year-old patient who is seen today for follow-up.  He has a history of chronic atrial fibrillation and has been on chronic Coumadin therapy.  No recent INRs.  He presents today with a 3-day history of leg pain worse with ambulation.  He is status post abdominal aortic aneurysm repair and does have peripheral vascular disease.  He complains of general weakness.  He has a history of COPD and systolic heart failure.  Allergies: 1)  ! Novocain (Procaine Hcl)  Past History:  Past Medical History: Reviewed history from 09/14/2008 and no changes required. Atrial fibrillation COPD Hypertension Hypothyroidism Peptic ulcer disease Peripheral vascular disease DJD Congestive heart failure Coronary artery disease left brain Cardioembolic stroke May 2010  Past Surgical History: Reviewed history from 08/11/2008 and no changes required. Appendectomy Inguinal herniorrhaphy Tonsillectomy Abdominal aortic aneurysm repair Lumbar laminectomy Transurethral resection of prostate   Review of Systems       The patient complains of dyspnea on exertion, muscle weakness, and difficulty walking.  The patient denies anorexia, fever, weight loss, weight gain, vision loss, decreased hearing, hoarseness, chest pain, syncope, peripheral edema, prolonged cough, headaches, hemoptysis, abdominal pain, melena, hematochezia, severe indigestion/heartburn, hematuria, incontinence, genital sores, suspicious skin lesions, transient blindness, depression, unusual weight change, abnormal bleeding,  enlarged lymph nodes, angioedema, breast masses, and testicular masses.    Physical Exam  General:  underweight appearing.  120/74underweight appearing.   Head:  Normocephalic and atraumatic without obvious abnormalities. No apparent alopecia or balding. Eyes:  No corneal or conjunctival inflammation noted. EOMI. Perrla. Funduscopic exam benign, without hemorrhages, exudates or papilledema. Vision grossly normal. Mouth:  Oral mucosa and oropharynx without lesions or exudates.  Teeth in good repair. Neck:  No deformities, masses, or tenderness noted. Lungs:  Normal respiratory effort, chest expands symmetrically. Lungs are clear to auscultation, no crackles or wheezes. Heart:  irregular rhythm, with a controlled ventricular response Abdomen:  Bowel sounds positive,abdomen soft and non-tender without masses, organomegaly or hernias noted. femoral pulses, were full without bruits Msk:  No deformity or scoliosis noted of thoracic or lumbar spine.   Pulses:  dorsalis pedis pulses were intact.  Posterior tibia pulses were not easily palpable Extremities:  mild calf tenderness, left greater than right to gentle palpation   Impression & Recommendations:  Problem # 1:  LEG PAIN, BILATERAL (ICD-729.5) my impression is that  this is more muscular and not true claudication.  Problem # 2:  COUMADIN THERAPY (ICD-V58.61)  check an INR today  Orders: Protime (96045WU) Fingerstick (98119)  Problem # 3:  LASSITUDE (ICD-780.79) will check a CBC  Problem # 4:  CHRONIC SYSTOLIC HEART FAILURE (ICD-428.22)  His updated medication list for this problem includes:    Anacin 81 Mg Tbec (Aspirin) ..... One daily    Warfarin Sodium 2.5 Mg Tabs (Warfarin sodium) ..... One daily    Furosemide 40 Mg Tabs (Furosemide) ..... One half a tablet in the morning  monday  wednesday, friday only    Coreg 3.125 Mg Tabs (Carvedilol) ..... One half tablet twice daily    His updated medication list for this problem  includes:    Anacin 81 Mg Tbec (Aspirin) ..... One daily    Warfarin Sodium 2.5 Mg Tabs (Warfarin sodium) ..... One daily    Furosemide 40 Mg Tabs (Furosemide) ..... One half a tablet in the morning  monday wednesday, friday only    Coreg 3.125 Mg Tabs (Carvedilol) ..... One half tablet twice daily  Orders: Venipuncture (44034) TLB-BMP (Basic Metabolic Panel-BMET) (80048-METABOL) TLB-CBC Platelet - w/Differential (85025-CBCD) TLB-Magnesium (Mg) (83735-MG)  Complete Medication List: 1)  Levothroid 50 Mcg Tabs (Levothyroxine sodium) .Marland Kitchen.. 1 once daily 2)  Eql Omeprazole 20 Mg Tbec (Omeprazole) .Marland Kitchen.. 1 once daily 3)  Flomax 0.4 Mg Cp24 (Tamsulosin hcl) .Marland Kitchen.. 1 once daily 4)  Lorazepam 0.5 Mg Tabs (Lorazepam) .Marland Kitchen.. 1 two times a day as needed 5)  Nitroquick 0.4 Mg Subl (Nitroglycerin) .... Use as dir as needed 6)  Symbicort 80-4.5 Mcg/act Aero (Budesonide-formoterol fumarate) .... Two puffs twice daily as needed 7)  Anacin 81 Mg Tbec (Aspirin) .... One daily 8)  Warfarin Sodium 2.5 Mg Tabs (Warfarin sodium) .... One daily 9)  Ipratropium-albuterol 0.5-2.5 (3) Mg/70ml Soln (Ipratropium-albuterol) .... Use three times daily as needed 10)  Furosemide 40 Mg Tabs (Furosemide) .... One half a tablet in the morning  monday wednesday, friday only 11)  Androgel Pump 1 % Gel (Testosterone) .... Use daily 12)  Tamsulosin Hcl 0.4 Mg Caps (Tamsulosin hcl) .... One daily 13)  Klor-con 10 10 Meq Cr-tabs (Potassium chloride) .... One daily 14)  Coreg 3.125 Mg Tabs (Carvedilol) .... One half tablet twice daily 15)  Tramadol Hcl 50 Mg Tabs (Tramadol hcl) .... One every 6 hours as needed for pain  Patient Instructions: 1)  Limit your Sodium (Salt). 2)  monthly pro times 3)  Please schedule a follow-up appointment in 3 months. Prescriptions: TRAMADOL HCL 50 MG TABS (TRAMADOL HCL) one every 6 hours as needed for pain  #90 x 6   Entered and Authorized by:   Gordy Savers  MD   Signed by:   Gordy Savers  MD on 08/09/2009   Method used:   [facility] to        [facility]  [phone]* (retail)       [address] [place], [place]  [address]       Ph: [phone] or [phone]       Fax: [phone]   RxID:   [id number]   Laboratory Results   Blood Tests   Date/Time Recieved: Aug 09, 2009 12:22 PM  Date/Time Reported: Aug 09, 2009 12:22 PM   PT: 15.2 s   (Normal Range: 10.6-13.4)  INR: 1.5   (Normal Range: 0.88-1.12   Therap INR: 2.0-3.5) Comments: Wynona Canes, CMA  Aug 09, 2009 12:22 PM       ANTICOAGULATION RECORD PREVIOUS REGIMEN & LAB RESULTS   Previous INR:  1.9 on  04/19/2009  Previous Regimen:  same on  02/12/2009  NEW REGIMEN & LAB RESULTS Anticoag. Dx: V58.83,V58.61,427.31 Current INR Goal Range: 2.0-3.0 Current INR: 1.5 Current Coumadin Dose(mg): 5mg  only on wed, then 2.5mg  x 6 days  Regimen: 5mg  on wed,sun then 2.5mg  on other days       Repeat testing in: 4 weeks MEDICATIONS LEVOTHROID 50 MCG  TABS (LEVOTHYROXINE SODIUM) 1 once daily  EQL OMEPRAZOLE 20 MG  TBEC (OMEPRAZOLE) 1 once daily FLOMAX 0.4 MG  CP24 (TAMSULOSIN HCL) 1 once daily LORAZEPAM 0.5 MG  TABS (LORAZEPAM) 1 two times a day as needed NITROQUICK 0.4 MG  SUBL (NITROGLYCERIN) use as dir as needed SYMBICORT 80-4.5 MCG/ACT  AERO (BUDESONIDE-FORMOTEROL FUMARATE) two puffs twice daily as needed ANACIN 81 MG TBEC (ASPIRIN) one daily WARFARIN SODIUM 2.5 MG TABS (WARFARIN SODIUM) one daily IPRATROPIUM-ALBUTEROL 0.5-2.5 (3) MG/3ML SOLN (IPRATROPIUM-ALBUTEROL) Use three times daily as needed FUROSEMIDE 40 MG TABS (FUROSEMIDE) one half a tablet in the morning  Monday Wednesday, Friday only ANDROGEL PUMP 1 % GEL (TESTOSTERONE) use daily TAMSULOSIN HCL 0.4 MG CAPS (TAMSULOSIN HCL) one daily KLOR-CON 10 10 MEQ CR-TABS (POTASSIUM CHLORIDE) one daily COREG 3.125 MG TABS (CARVEDILOL) one half tablet twice daily TRAMADOL HCL 50 MG TABS (TRAMADOL HCL) one every 6 hours  as needed for pain   Anticoagulation Visit Questionnaire      Coumadin dose missed/changed:  No      Abnormal Bleeding Symptoms:  No   Any diet changes including alcohol intake, vegetables or greens since the last visit:  No Any signs of clotting since the last visit (including chest discomfort, dizziness, shortness of breath, arm tingling, slurred speech, swelling or redness in leg):  No

## 2010-05-06 NOTE — Progress Notes (Signed)
Summary: please return call  Call back at Home Phone [phone]   Caller: Patient---triage vm Summary of Call: Having trouble breathing and is using his oxygen. please advise----      [facility] [address] Initial call taken by: Warnell Forester,  February 21, 2010 3:11 PM    called  Appended Document: please return call spoke with pt - appt made per Dr. Frederica Kuster

## 2010-05-06 NOTE — Assessment & Plan Note (Signed)
Summary: TROUBLE URINATING/NJR   Vital Signs:  Patient profile:   77 year old male Weight:      129 pounds Temp:     98.2 degrees F oral BP sitting:   142 / 70  (right arm) Cuff size:   regular  Vitals Entered By: Duard Brady LPN (July 15, 2009 4:06 PM)  CC: c/o urine freq with sm amt  Is Patient Diabetic? No   Primary Care Provider:  Gordy Savers  MD  CC:  c/o urine freq with sm amt .  History of Present Illness: 77 year old patient who presents with a chief complaint of urinary frequency and urgency.  He does have remote history of a TURP at least 12 years ago that maybe longer.  He has been on Flomax with unclear benefit.  He has had no recent prostate examination and review of his records reveal nothing about prostate size.  A urinalysis was reviewed and did reveal some hematuria.  He is not on low-dose aspirin and Coumadin therapy for atrial fibrillation  and a history of a cardioembolic stroke.  He is also on diuretic therapy Monday, Wednesday, Friday  Allergies: 1)  ! Novocain (Procaine Hcl)  Past History:  Past Medical History: Reviewed history from 09/14/2008 and no changes required. Atrial fibrillation COPD Hypertension Hypothyroidism Peptic ulcer disease Peripheral vascular disease DJD Congestive heart failure Coronary artery disease left brain Cardioembolic stroke May 2010  Past Surgical History: Reviewed history from 08/11/2008 and no changes required. Appendectomy Inguinal herniorrhaphy Tonsillectomy Abdominal aortic aneurysm repair Lumbar laminectomy Transurethral resection of prostate   Review of Systems       The patient complains of dyspnea on exertion and difficulty walking.  The patient denies anorexia, fever, weight loss, weight gain, vision loss, decreased hearing, hoarseness, chest pain, syncope, peripheral edema, prolonged cough, headaches, hemoptysis, abdominal pain, melena, hematochezia, severe indigestion/heartburn,  hematuria, incontinence, genital sores, muscle weakness, suspicious skin lesions, transient blindness, depression, unusual weight change, abnormal bleeding, enlarged lymph nodes, angioedema, breast masses, and testicular masses.    Physical Exam  General:  elderly frail, no distress at rest.  Repeat blood pressure 110/74 Head:  Normocephalic and atraumatic without obvious abnormalities. No apparent alopecia or balding. Neck:  No deformities, masses, or tenderness noted. Lungs:  generally clear with diminished breath sounds.  02 saturation 93% Heart:  Normal rate and regular rhythm. S1 and S2 normal without gallop, murmur, click, rub or other extra sounds.  pulse rate 80 Prostate:  no gland enlargement.   Msk:  No deformity or scoliosis noted of thoracic or lumbar spine.   Extremities:  no edema   Impression & Recommendations:  Problem # 1:  URINARY URGENCY (ZOX-096.04) the patient is status post TURP, and this does not appear to be an obstructive symptoms.  May have overactive bladder  Problem # 2:  ABNORMALITY OF GAIT (ICD-781.2)  Problem # 3:  COUMADIN THERAPY (ICD-V58.61)  Problem # 4:  STROKE (ICD-434.91)  His updated medication list for this problem includes:    Anacin 81 Mg Tbec (Aspirin) ..... One daily    Warfarin Sodium 2.5 Mg Tabs (Warfarin sodium) ..... One daily  Complete Medication List: 1)  Levothroid 50 Mcg Tabs (Levothyroxine sodium) .Marland Kitchen.. 1 once daily 2)  Eql Omeprazole 20 Mg Tbec (Omeprazole) .Marland Kitchen.. 1 once daily 3)  Flomax 0.4 Mg Cp24 (Tamsulosin hcl) .Marland Kitchen.. 1 once daily 4)  Lorazepam 0.5 Mg Tabs (Lorazepam) .Marland Kitchen.. 1 two times a day as needed 5)  Nitroquick 0.4 Mg  Subl (Nitroglycerin) .... Use as dir as needed 6)  Symbicort 80-4.5 Mcg/act Aero (Budesonide-formoterol fumarate) .... Two puffs twice daily as needed 7)  Anacin 81 Mg Tbec (Aspirin) .... One daily 8)  Warfarin Sodium 2.5 Mg Tabs (Warfarin sodium) .... One daily 9)  Ipratropium-albuterol 0.5-2.5 (3) Mg/22ml  Soln (Ipratropium-albuterol) .... Use three times daily as needed 10)  Furosemide 40 Mg Tabs (Furosemide) .... One half a tablet in the morning  monday wednesday, friday only 11)  Androgel Pump 1 % Gel (Testosterone) .... Use daily 12)  Tamsulosin Hcl 0.4 Mg Caps (Tamsulosin hcl) .... One daily 13)  Klor-con 10 10 Meq Cr-tabs (Potassium chloride) .... One daily 14)  Coreg 3.125 Mg Tabs (Carvedilol) .... One half tablet twice daily  Other Orders: UA Dipstick w/o Micro (manual) ([id number]) DRE ([id number])  Patient Instructions: 1)  Limit your Sodium (Salt) to less than 2 grams a day(slightly less than 1/2 a teaspoon) to prevent fluid retention, swelling, or worsening of symptoms. 2)  call if symptoms intensify  Laboratory Results   Urine Tests  Date/Time Received: July 15, 2009 4:12 PM  Date/Time Reported: July 15, 2009 4:12 PM   Routine Urinalysis   Color: yellow Appearance: Clear Glucose: negative   (Normal Range: Negative) Bilirubin: negative   (Normal Range: Negative) Ketone: negative   (Normal Range: Negative) Spec. Gravity: <1.005   (Normal Range: 1.003-1.035) Blood: trace-intact   (Normal Range: Negative) pH: 7.5   (Normal Range: 5.0-8.0) Protein: negative   (Normal Range: Negative) Urobilinogen: 0.2   (Normal Range: 0-1) Nitrite: negative   (Normal Range: Negative) Leukocyte Esterace: negative   (Normal Range: Negative)

## 2010-05-06 NOTE — Progress Notes (Signed)
  Phone Note Call from Patient Call back at Home Phone [phone]   Caller: Patient Call For: Gordy Savers  MD Reason for Call: Refill Medication Summary of Call: Pt needs prescription for Fentanyl.  Please call when ready. Initial call taken by: [name] CMA,  January 15, 2010 11:55 AM    Prescriptions: FENTANYL 25 MCG/HR PT72 (FENTANYL) use every 72 hours  #12 x 0   Entered and Authorized by:   Gordy Savers  MD   Signed by:   Gordy Savers  MD on 01/15/2010   Method used:   Print then Give to Patient   RxID:   [id number]   Appended Document:  called pt - rx ready for pick up    KIK

## 2010-05-06 NOTE — Assessment & Plan Note (Signed)
Summary: trouble urinating---very painful ok per dr//ccm   Vital Signs:  Patient profile:   77 year old male Weight:      129 pounds Temp:     98.3 degrees F oral BP sitting:   140 / 80  (right arm) Cuff size:   regular  Vitals Entered By: Duard Brady LPN (September 04, 1608 9:20 AM) CC: c/o difficult to urinate - had epidural block yesterday Is Patient Diabetic? No   Primary Care Provider:  Gordy Savers  MD  CC:  c/o difficult to urinate - had epidural block yesterday.  History of Present Illness: 77 year old patient who underwent a lumbar epidural yesterday.  He states that he already feels some improvement.  This morning.  He had some difficulty with urination.  He is on Flomax 0.4 mg daily.  He denies any abdominal pain.  his Coumadin therapy has been held prior to the procedure, but has now been resumed  Allergies: 1)  ! Novocain (Procaine Hcl)  Past History:  Past Medical History: Reviewed history from 08/30/2009 and no changes required. Atrial fibrillation COPD Hypertension Hypothyroidism Peptic ulcer disease Peripheral vascular disease DJD Congestive heart failure Coronary artery disease left brain Cardioembolic stroke May 2010 Osteoarthritis spinal stenosis and multilevel foraminal stenosis  Review of Systems       The patient complains of difficulty walking.  The patient denies anorexia, fever, weight loss, weight gain, vision loss, decreased hearing, hoarseness, chest pain, syncope, dyspnea on exertion, peripheral edema, prolonged cough, headaches, hemoptysis, abdominal pain, melena, hematochezia, severe indigestion/heartburn, hematuria, incontinence, genital sores, muscle weakness, suspicious skin lesions, transient blindness, depression, unusual weight change, abnormal bleeding, enlarged lymph nodes, angioedema, breast masses, and testicular masses.    Physical Exam  General:  elderly frail, in no distress walks with a cane.  Blood pressure  normal Lungs:  Normal respiratory effort, chest expands symmetrically. Lungs are clear to auscultation, no crackles or wheezes. Heart:  Normal rate and regular rhythm. S1 and S2 normal without gallop, murmur, click, rub or other extra sounds. Abdomen:  no suprapubic tenderness or dullness.  No evidence of urinary retention   Impression & Recommendations:  Problem # 1:  SPINAL STENOSIS (ICD-724.00)  Problem # 2:  BENIGN PROSTATIC HYPERTROPHY, WITH URINARY OBSTRUCTION (ICD-600.01)  The following medications were removed from the medication list:    Flomax 0.4 Mg Cp24 (Tamsulosin hcl) .Marland Kitchen... 1 once daily His updated medication list for this problem includes:    Tamsulosin Hcl 0.4 Mg Caps (Tamsulosin hcl) ..... One daily  Complete Medication List: 1)  Levothroid 50 Mcg Tabs (Levothyroxine sodium) .Marland Kitchen.. 1 once daily 2)  Eql Omeprazole 20 Mg Tbec (Omeprazole) .Marland Kitchen.. 1 once daily 3)  Lorazepam 0.5 Mg Tabs (Lorazepam) .Marland Kitchen.. 1 two times a day as needed 4)  Nitroquick 0.4 Mg Subl (Nitroglycerin) .... Use as dir as needed 5)  Symbicort 80-4.5 Mcg/act Aero (Budesonide-formoterol fumarate) .... Two puffs twice daily as needed 6)  Anacin 81 Mg Tbec (Aspirin) .... One daily 7)  Warfarin Sodium 2.5 Mg Tabs (Warfarin sodium) .... One daily 8)  Ipratropium-albuterol 0.5-2.5 (3) Mg/67ml Soln (Ipratropium-albuterol) .... Use three times daily as needed 9)  Furosemide 40 Mg Tabs (Furosemide) .... One half a tablet in the morning  monday wednesday, friday only 10)  Androgel Pump 1 % Gel (Testosterone) .... Use daily 11)  Tamsulosin Hcl 0.4 Mg Caps (Tamsulosin hcl) .... One daily 12)  Klor-con 10 10 Meq Cr-tabs (Potassium chloride) .... One daily 13)  Coreg  3.125 Mg Tabs (Carvedilol) .... One half tablet twice daily 14)  Tramadol Hcl 50 Mg Tabs (Tramadol hcl) .... One every 6 hours as needed for pain 15)  Hydrocodone-acetaminophen 5-500 Mg Tabs (Hydrocodone-acetaminophen) .... One every 6 hours for pain  Other  Orders: UA Dipstick w/o Micro (manual) (16109)  Patient Instructions: 1)  Please schedule a follow-up appointment in 3 months. 2)  increase Flomax to twice daily for the next few days  Laboratory Results   Urine Tests  Date/Time Received: September 04, 2009 9:28 AM  Date/Time Reported: September 04, 2009 9:28 AM   Routine Urinalysis   Color: yellow Appearance: Clear Glucose: negative   (Normal Range: Negative) Bilirubin: negative   (Normal Range: Negative) Ketone: negative   (Normal Range: Negative) Spec. Gravity: <1.005   (Normal Range: 1.003-1.035) Blood: trace-lysed   (Normal Range: Negative) pH: 7.5   (Normal Range: 5.0-8.0) Protein: trace   (Normal Range: Negative) Urobilinogen: 0.2   (Normal Range: 0-1) Nitrite: negative   (Normal Range: Negative) Leukocyte Esterace: negative   (Normal Range: Negative)

## 2010-05-06 NOTE — Miscellaneous (Signed)
Summary: Physician's Orders/[facility]  Physician's Orders/[facility]   Imported By: Maryln Gottron 10/25/2009 15:18:21  _____________________________________________________________________  External Attachment:    Type:   Image     Comment:   External Document

## 2010-05-06 NOTE — Assessment & Plan Note (Signed)
Summary: discuss Coumadin/dm   Vital Signs:  Patient profile:   77 year old male Weight:      120 pounds Temp:     98.3 degrees F oral BP sitting:   120 / 80  (right arm) Cuff size:   regular  Vitals Entered By: Kathrynn Speed CMA (November 20, 2009 10:32 AM) CC: Discuss coumadin, src   Primary Care Tiara Maultsby:  Corey Savers  MD  CC:  Discuss coumadin and src.  History of Present Illness:  77 year old patient who is seen today for follow-up.  he has been on fentanyl for pain control due to severe symptomatic spinal stenosis.  Complaints today include nausea and.  He feel strongly this is related to Coumadin therapy and wishes to switch over to aspirin and Plavix combination, which he has used in the past.  He is aware that narcotics tramadol, and other drugs can also cause nausea.  He states that he has had similar problems with Coumadin in the past and strongly wishes to discontinue for one month and reassess.  The nausea.  His cardiopulmonary status has been stable.  No symptoms of congestive heart failure  Current Medications (verified): 1)  Levothroid 50 Mcg  Tabs (Levothyroxine Sodium) .Marland Kitchen.. 1 Once Daily 2)  Eql Omeprazole 20 Mg  Tbec (Omeprazole) .Marland Kitchen.. 1 Once Daily 3)  Lorazepam 0.5 Mg  Tabs (Lorazepam) .Marland Kitchen.. 1 Two Times A Day As Needed 4)  Nitroquick 0.4 Mg  Subl (Nitroglycerin) .... Use As Dir As Needed 5)  Symbicort 80-4.5 Mcg/act  Aero (Budesonide-Formoterol Fumarate) .... Two Puffs Twice Daily As Needed 6)  Anacin 81 Mg Tbec (Aspirin) .... One Daily 7)  Warfarin Sodium 2.5 Mg Tabs (Warfarin Sodium) .... One Daily 8)  Ipratropium-Albuterol 0.5-2.5 (3) Mg/40ml Soln (Ipratropium-Albuterol) .... Use Three Times Daily As Needed 9)  Furosemide 40 Mg Tabs (Furosemide) .... One Half A Tablet in The Morning  Monday Wednesday, Friday Only 10)  Tamsulosin Hcl 0.4 Mg Caps (Tamsulosin Hcl) .... One Daily 11)  Klor-Con 10 10 Meq Cr-Tabs (Potassium Chloride) .... One Daily 12)  Coreg  3.125 Mg Tabs (Carvedilol) .... One Half Tablet Twice Daily 13)  Tramadol Hcl 50 Mg Tabs (Tramadol Hcl) .... One Every 6 Hours As Needed For Pain 14)  Hydrocodone-Acetaminophen 5-500 Mg Tabs (Hydrocodone-Acetaminophen) .... One Every 6 Hours For Pain 15)  Ipratropium Bromide 0.02 % Soln (Ipratropium Bromide) .... Use Two Times A Day 16)  Avodart 0.5 Mg Caps (Dutasteride) .... One Daily 17)  Fentanyl 25 Mcg/hr Pt72 (Fentanyl) .... Use Every 72 Hours  Allergies (verified): 1)  ! Novocain (Procaine Hcl)  Past History:  Past Medical History: Reviewed history from 08/30/2009 and no changes required. Atrial fibrillation COPD Hypertension Hypothyroidism Peptic ulcer disease Peripheral vascular disease DJD Congestive heart failure Coronary artery disease left brain Cardioembolic stroke May 2010 Osteoarthritis spinal stenosis and multilevel foraminal stenosis  Past Surgical History: Reviewed history from 08/15/2009 and no changes required. Appendectomy Inguinal herniorrhaphy Tonsillectomy Abdominal aortic aneurysm repair Lumbar laminectomy  1967, 1977, 1998  Transurethral resection of prostate   Review of Systems       The patient complains of dyspnea on exertion and difficulty walking.  The patient denies anorexia, fever, weight loss, weight gain, vision loss, decreased hearing, hoarseness, chest pain, syncope, peripheral edema, prolonged cough, headaches, hemoptysis, abdominal pain, melena, hematochezia, severe indigestion/heartburn, hematuria, incontinence, genital sores, muscle weakness, suspicious skin lesions, transient blindness, depression, unusual weight change, abnormal bleeding, enlarged lymph nodes, angioedema, breast masses, and  testicular masses.    Physical Exam  General:  elderly alert, no distress.  Blood pressure 120/64 Head:  Normocephalic and atraumatic without obvious abnormalities. No apparent alopecia or balding. Mouth:  Oral mucosa and oropharynx without  lesions or exudates.  Teeth in good repair. Neck:  No deformities, masses, or tenderness noted. Lungs:  Normal respiratory effort, chest expands symmetrically. Lungs are clear to auscultation, no crackles or wheezes. O2 saturation 94 to 96% Heart:  irregular with a controlled ventricular response Extremities:  no edema   Impression & Recommendations:  Problem # 1:  NAUSEA (ICD-787.02)  Problem # 2:  SPINAL STENOSIS (ICD-724.00)  Complete Medication List: 1)  Levothroid 50 Mcg Tabs (Levothyroxine sodium) .Marland Kitchen.. 1 once daily 2)  Eql Omeprazole 20 Mg Tbec (Omeprazole) .Marland Kitchen.. 1 once daily 3)  Lorazepam 0.5 Mg Tabs (Lorazepam) .Marland Kitchen.. 1 two times a day as needed 4)  Nitroquick 0.4 Mg Subl (Nitroglycerin) .... Use as dir as needed 5)  Symbicort 80-4.5 Mcg/act Aero (Budesonide-formoterol fumarate) .... Two puffs twice daily as needed 6)  Anacin 81 Mg Tbec (Aspirin) .... One daily 7)  Ipratropium-albuterol 0.5-2.5 (3) Mg/60ml Soln (Ipratropium-albuterol) .... Use three times daily as needed 8)  Furosemide 40 Mg Tabs (Furosemide) .... One half a tablet in the morning  monday wednesday, friday only 9)  Tamsulosin Hcl 0.4 Mg Caps (Tamsulosin hcl) .... One daily 10)  Klor-con 10 10 Meq Cr-tabs (Potassium chloride) .... One daily 11)  Coreg 3.125 Mg Tabs (Carvedilol) .... One half tablet twice daily 12)  Tramadol Hcl 50 Mg Tabs (Tramadol hcl) .... One every 6 hours as needed for pain 13)  Hydrocodone-acetaminophen 5-500 Mg Tabs (Hydrocodone-acetaminophen) .... One every 6 hours for pain 14)  Ipratropium Bromide 0.02 % Soln (Ipratropium bromide) .... Use two times a day 15)  Avodart 0.5 Mg Caps (Dutasteride) .... One daily 16)  Fentanyl 25 Mcg/hr Pt72 (Fentanyl) .... Use every 72 hours 17)  Plavix 75 Mg Tabs (Clopidogrel bisulfate) .... One daily  Patient Instructions: 1)  Please schedule a follow-up appointment in 1 month. 2)  Limit your Sodium (Salt) to less than 2 grams a day(slightly less than 1/2 a  teaspoon) to prevent fluid retention, swelling, or worsening of symptoms. Prescriptions: PLAVIX 75 MG TABS (CLOPIDOGREL BISULFATE) one daily  #30 x 3   Entered and Authorized by:   Corey Savers  MD   Signed by:   Corey Savers  MD on 11/20/2009   Method used:   Electronically to        [facility]  [phone]* (retail)       [address] [place], [place]  [address]       Ph: [phone] or [phone]       Fax: [phone]   RxID:   [id number]

## 2010-05-06 NOTE — Miscellaneous (Signed)
Summary: Face to Face Encounter/[facility]  Face to Face Encounter/[facility]   Imported By: Maryln Gottron 10/22/2009 11:06:22  _____________________________________________________________________  External Attachment:    Type:   Image     Comment:   External Document

## 2010-05-06 NOTE — Progress Notes (Signed)
Summary: refill meds.  Phone Note Call from Patient   Summary of Call: [facility] Pt. would like to have [facility] Vicodin 5/500 Initial call taken by: [name] CMA,  October 08, 2009 8:04 AM  Follow-up for Phone Call        Debbie........ please research, how much he is taking and when the last refill was &  the quantity that Dr. Kirtland Bouchard. prescribes Follow-up by: Roderick Pee MD,  October 08, 2009 8:27 AM  Additional Follow-up for Phone Call Additional follow up Details #1::        08/27/2009 was the last refill and he usually gets #50 Additional Follow-up by: [name] CMA,  October 08, 2009 9:13 AM    Additional Follow-up for Phone Call Additional follow up Details #2::    call-in 50 tablets, use as directed, no refills Follow-up by: Roderick Pee MD,  October 08, 2009 10:01 AM  Additional Follow-up for Phone Call Additional follow up Details #3:: Details for Additional Follow-up Action Taken: rx called in Additional Follow-up by: Kern Reap CMA Duncan Dull),  October 08, 2009 3:02 PM

## 2010-05-06 NOTE — Assessment & Plan Note (Signed)
Summary: NAUSEA, UPSET STOMACH // RS   Vital Signs:  Patient profile:   77 year old male Weight:      128 pounds Temp:     97.4 degrees F oral BP sitting:   116 / 70  (right arm) Cuff size:   regular  Vitals Entered By: Duard Brady LPN (June 21, 2009 2:53 PM)  CC: c/o nausea, abd cramping ? ulcer Is Patient Diabetic? No   Primary Care Provider:  Gordy Savers  MD  CC:  c/o nausea and abd cramping ? ulcer.  History of Present Illness: 77 year old patient who presents with a 7-day history of some nausea and mid abdominal crampy pain.  He also describes increased gaseousness.  No chest pain or shortness of breath.  He does have a history of chronic atrial for ablation, COPD, and systolic heart failure.  He has a remote history of peptic ulcer disease and has been on chronic omeprazole treatment.  His cardiopulmonary status has been stable  Allergies: 1)  ! Novocain (Procaine Hcl)  Past History:  Past Medical History: Reviewed history from 09/14/2008 and no changes required. Atrial fibrillation COPD Hypertension Hypothyroidism Peptic ulcer disease Peripheral vascular disease DJD Congestive heart failure Coronary artery disease left brain Cardioembolic stroke May 2010  Review of Systems       The patient complains of anorexia and abdominal pain.  The patient denies fever, weight loss, weight gain, vision loss, decreased hearing, hoarseness, chest pain, syncope, dyspnea on exertion, peripheral edema, prolonged cough, headaches, hemoptysis, melena, hematochezia, severe indigestion/heartburn, hematuria, incontinence, genital sores, muscle weakness, suspicious skin lesions, transient blindness, difficulty walking, depression, unusual weight change, abnormal bleeding, enlarged lymph nodes, angioedema, breast masses, and testicular masses.    Physical Exam  General:  elderly frail, no distress.  Blood pressure 100/64 Head:  Normocephalic and atraumatic without  obvious abnormalities. No apparent alopecia or balding. Mouth:  Oral mucosa and oropharynx without lesions or exudates.  Teeth in good repair. Neck:  No deformities, masses, or tenderness noted. Lungs:  Normal respiratory effort, chest expands symmetrically. Lungs are clear to auscultation, no crackles or wheezes. Heart:  controlled ventricular response Abdomen:  very mild epigastric and mid elbow tenderness.  No distention.  Bowel sounds normal.  No organomegaly or masses Msk:  No deformity or scoliosis noted of thoracic or lumbar spine.   Extremities:  No clubbing, cyanosis, edema, or deformity noted with normal full range of motion of all joints.     Impression & Recommendations:  Problem # 1:  ABDOMINAL PAIN (ICD-789.00)  Problem # 2:  NAUSEA (ICD-787.02)  Problem # 3:  PEPTIC ULCER DISEASE (ICD-533.90)  His updated medication list for this problem includes:    Eql Omeprazole 20 Mg Tbec (Omeprazole) .Marland Kitchen... 1 once daily  His updated medication list for this problem includes:    Eql Omeprazole 20 Mg Tbec (Omeprazole) .Marland Kitchen... 1 once daily  Complete Medication List: 1)  Levothroid 50 Mcg Tabs (Levothyroxine sodium) .Marland Kitchen.. 1 once daily 2)  Eql Omeprazole 20 Mg Tbec (Omeprazole) .Marland Kitchen.. 1 once daily 3)  Flomax 0.4 Mg Cp24 (Tamsulosin hcl) .Marland Kitchen.. 1 once daily 4)  Lorazepam 0.5 Mg Tabs (Lorazepam) .Marland Kitchen.. 1 two times a day as needed 5)  Nitroquick 0.4 Mg Subl (Nitroglycerin) .... Use as dir as needed 6)  Symbicort 80-4.5 Mcg/act Aero (Budesonide-formoterol fumarate) .... Two puffs twice daily as needed 7)  Anacin 81 Mg Tbec (Aspirin) .... One daily 8)  Warfarin Sodium 2.5 Mg Tabs (Warfarin  sodium) .... One daily 9)  Ipratropium-albuterol 0.5-2.5 (3) Mg/23ml Soln (Ipratropium-albuterol) .... Use three times daily as needed 10)  Furosemide 40 Mg Tabs (Furosemide) .... One half a tablet in the morning  monday wednesday, friday only 11)  Androgel Pump 1 % Gel (Testosterone) .... Use daily 12)   Tamsulosin Hcl 0.4 Mg Caps (Tamsulosin hcl) .... One daily 13)  Klor-con 10 10 Meq Cr-tabs (Potassium chloride) .... One daily 14)  Coreg 3.125 Mg Tabs (Carvedilol) .... One half tablet twice daily  Patient Instructions: 1)  increase omeprazole (Aciphex) to 20 mg twice daily 2)  call if there is any clinical worsening 3)  Please schedule a follow-up appointment in 1 month.

## 2010-05-06 NOTE — Assessment & Plan Note (Signed)
Summary: not feeling well//ccm   Vital Signs:  Patient profile:   77 year old male Weight:      132 pounds Temp:     97.5 degrees F oral BP sitting:   90 / 70  (left arm) Cuff size:   regular  Vitals Entered By: Raechel Ache, RN (April 19, 2009 11:09 AM) CC: Hosp f/u.   Primary Care Provider:  Gordy Savers  MD  CC:  Hosp f/u.Marland Kitchen  History of Present Illness: 77 year old patient who is seen today following hospital discharge.  He presented with the increasing weakness involving his left lower extremity.  This resolved in the hospital..  Neurologic evaluation included a MRI, MRA revealed no definite achieved, stroke.  He has a history of COPD and chronic systolic heart failure.  His cardiopulmonary status has been fairly stable.  He is a long history of a gait abnormality and is a high fall risk.  He is on chronic Coumadin, which has been continued due to his chronic atrial fibrillation  and history of cardioembolic strokes  Allergies: 1)  ! Novocain (Procaine Hcl)  Past History:  Past Medical History: Reviewed history from 09/14/2008 and no changes required. Atrial fibrillation COPD Hypertension Hypothyroidism Peptic ulcer disease Peripheral vascular disease DJD Congestive heart failure Coronary artery disease left brain Cardioembolic stroke May 2010  Past Surgical History: Reviewed history from 08/11/2008 and no changes required. Appendectomy Inguinal herniorrhaphy Tonsillectomy Abdominal aortic aneurysm repair Lumbar laminectomy Transurethral resection of prostate   Review of Systems       The patient complains of decreased hearing, dyspnea on exertion, muscle weakness, and difficulty walking.  The patient denies anorexia, fever, weight loss, weight gain, vision loss, hoarseness, chest pain, syncope, peripheral edema, prolonged cough, headaches, hemoptysis, abdominal pain, melena, hematochezia, severe indigestion/heartburn, hematuria, incontinence, genital  sores, suspicious skin lesions, transient blindness, depression, unusual weight change, abnormal bleeding, enlarged lymph nodes, angioedema, breast masses, and testicular masses.    Physical Exam  General:  alert frail, no distress; blood pressure 90/60 Head:  Normocephalic and atraumatic without obvious abnormalities. No apparent alopecia or balding. Eyes:  No corneal or conjunctival inflammation noted. EOMI. Perrla. Funduscopic exam benign, without hemorrhages, exudates or papilledema. Vision grossly normal. Mouth:  Oral mucosa and oropharynx without lesions or exudates.  Teeth in good repair. Neck:  No deformities, masses, or tenderness noted. Lungs:  Normal respiratory effort, chest expands symmetrically. Lungs are clear to auscultation, no crackles or wheezes. Heart:  controlled ventricular response Abdomen:  Bowel sounds positive,abdomen soft and non-tender without masses, organomegaly or hernias noted. Msk:  No deformity or scoliosis noted of thoracic or lumbar spine.   Extremities:  no edema   Impression & Recommendations:  Problem # 1:  COUMADIN THERAPY (ICD-V58.61)  Orders: [facility] ([id number])  Problem # 2:  ABNORMALITY OF GAIT (ICD-781.2)  Problem # 3:  CHRONIC SYSTOLIC HEART FAILURE (ICD-428.22)  His updated medication list for this problem includes:    Anacin 81 Mg Tbec (Aspirin) ..... One daily    Warfarin Sodium 2.5 Mg Tabs (Warfarin sodium) ..... One daily    Furosemide 40 Mg Tabs (Furosemide) ..... One half a tablet in the morning  Problem # 4:  HYPERTENSION (ICD-401.9)  His updated medication list for this problem includes:    Furosemide 40 Mg Tabs (Furosemide) ..... One half a tablet in the morning  Problem # 5:  ATRIAL FIBRILLATION (ICD-427.31)  His updated medication list for this problem includes:    Anacin 81 Mg  Tbec (Aspirin) ..... One daily    Warfarin Sodium 2.5 Mg Tabs (Warfarin sodium) ..... One daily  Complete Medication List: 1)  Levothroid 50  Mcg Tabs (Levothyroxine sodium) .Marland Kitchen.. 1 once daily 2)  Eql Omeprazole 20 Mg Tbec (Omeprazole) .Marland Kitchen.. 1 once daily 3)  Flomax 0.4 Mg Cp24 (Tamsulosin hcl) .Marland Kitchen.. 1 once daily 4)  Lorazepam 0.5 Mg Tabs (Lorazepam) .Marland Kitchen.. 1 two times a day as needed 5)  Nitroquick 0.4 Mg Subl (Nitroglycerin) .... Use as dir as needed 6)  Symbicort 80-4.5 Mcg/act Aero (Budesonide-formoterol fumarate) .... Two puffs twice daily as needed 7)  Carvedilol 6.25 Tabs (carvedilol)  .... One tablet   twice daily 8)  Anacin 81 Mg Tbec (Aspirin) .... One daily 9)  Warfarin Sodium 2.5 Mg Tabs (Warfarin sodium) .... One daily 10)  Ipratropium-albuterol 0.5-2.5 (3) Mg/44ml Soln (Ipratropium-albuterol) .... Use three times daily as needed 11)  Furosemide 40 Mg Tabs (Furosemide) .... One half a tablet in the morning 12)  Androgel Pump 1 % Gel (Testosterone) .... Use daily 13)  Tamsulosin Hcl 0.4 Mg Caps (Tamsulosin hcl) .... One daily 14)  Klor-con 10 10 Meq Cr-tabs (Potassium chloride) .... One daily  Patient Instructions: 1)  Please schedule a follow-up appointment in 1 month. 2)  Limit your Sodium (Salt).  Laboratory Results   Blood Tests     PT: 17.1 s   (Normal Range: 10.6-13.4)  INR: 1.9   (Normal Range: 0.88-1.12   Therap INR: 2.0-3.5) Comments: Joanne Chars CMA  April 19, 2009 11:58 AM

## 2010-05-06 NOTE — Progress Notes (Signed)
Summary: SOB  Phone Note Call from Patient   Caller: Patient Call For: Gordy Savers  MD Summary of Call: Pt is having to use the O2 every night and would rather not have to use it all the time.   Anything else he can do? Initial call taken by: [name] CMA AAMA,  January 29, 2010 9:54 AM  Follow-up for Phone Call        called Follow-up by: Gordy Savers  MD,  January 29, 2010 12:07 PM

## 2010-05-06 NOTE — Assessment & Plan Note (Signed)
Summary: feeling worse/dm   Vital Signs:  Patient profile:   77 year old male Weight:      126 pounds Temp:     97.8 degrees F oral BP sitting:   110 / 70  (right arm) Cuff size:   regular  Vitals Entered By: Duard Brady LPN (June 13, 2009 11:03 AM) CC: c/o not feeling well Is Patient Diabetic? No  8  Primary Care Provider:  Gordy Savers  MD  CC:  c/o not feeling well.  History of Present Illness:  77 year old patient who has a history of chronic systolic heart failure, chronic atrial fibrillation, coronary artery disease .  He has had a recent hospital discharge for community-acquired pneumonia.  He still complains of weakness, but clinically appears much better today, and back to baseline.  His medications were adjusted last visit due to hypotension.  Laboratory studies were performed that revealed normal electrolytes normal.  White count and normal renal function studies.  Denies any pulmonary complaints.  Only complaint is some persistent fatigue; his orthostatic symptoms, and nausea have resolved  Allergies: 1)  ! Novocain (Procaine Hcl)  Past History:  Past Medical History: Reviewed history from 09/14/2008 and no changes required. Atrial fibrillation COPD Hypertension Hypothyroidism Peptic ulcer disease Peripheral vascular disease DJD Congestive heart failure Coronary artery disease left brain Cardioembolic stroke May 2010  Review of Systems       The patient complains of anorexia and muscle weakness.  The patient denies fever, weight loss, weight gain, vision loss, decreased hearing, hoarseness, chest pain, syncope, dyspnea on exertion, peripheral edema, prolonged cough, headaches, hemoptysis, abdominal pain, melena, hematochezia, severe indigestion/heartburn, hematuria, incontinence, genital sores, suspicious skin lesions, transient blindness, difficulty walking, depression, unusual weight change, abnormal bleeding, enlarged lymph nodes, angioedema,  breast masses, and testicular masses.    Physical Exam  General:  underweight appearing.  alert no distress.  Blood pressure 110/70underweight appearing.   Head:  Normocephalic and atraumatic without obvious abnormalities. No apparent alopecia or balding. Eyes:  No corneal or conjunctival inflammation noted. EOMI. Perrla. Funduscopic exam benign, without hemorrhages, exudates or papilledema. Vision grossly normal. Mouth:  Oral mucosa and oropharynx without lesions or exudates.  Teeth in good repair.well-hydrated Neck:  No deformities, masses, or tenderness noted. Lungs:  Normal respiratory effort, chest expands symmetrically. Lungs are clear to auscultation, no crackles or wheezes. Heart:  controlled ventricular response Extremities:  no edema   Impression & Recommendations:  Problem # 1:  CHRONIC SYSTOLIC HEART FAILURE (ICD-428.22)  His updated medication list for this problem includes:    Anacin 81 Mg Tbec (Aspirin) ..... One daily    Warfarin Sodium 2.5 Mg Tabs (Warfarin sodium) ..... One daily    Furosemide 40 Mg Tabs (Furosemide) ..... One half a tablet in the morning  monday wednesday, friday only    Coreg 3.125 Mg Tabs (Carvedilol) ..... One half tablet twice daily  His updated medication list for this problem includes:    Anacin 81 Mg Tbec (Aspirin) ..... One daily    Warfarin Sodium 2.5 Mg Tabs (Warfarin sodium) ..... One daily    Furosemide 40 Mg Tabs (Furosemide) ..... One half a tablet in the morning  monday wednesday, friday only    Coreg 3.125 Mg Tabs (Carvedilol) ..... One half tablet twice daily  Problem # 2:  PERIPHERAL VASCULAR DISEASE (ICD-443.9)  Problem # 3:  HYPERTENSION (ICD-401.9)  His updated medication list for this problem includes:    Furosemide 40 Mg Tabs (Furosemide) ..... One  half a tablet in the morning  monday wednesday, friday only    Coreg 3.125 Mg Tabs (Carvedilol) ..... One half tablet twice daily  His updated medication list for this problem  includes:    Furosemide 40 Mg Tabs (Furosemide) ..... One half a tablet in the morning  monday wednesday, friday only    Coreg 3.125 Mg Tabs (Carvedilol) ..... One half tablet twice daily  Complete Medication List: 1)  Levothroid 50 Mcg Tabs (Levothyroxine sodium) .Marland Kitchen.. 1 once daily 2)  Eql Omeprazole 20 Mg Tbec (Omeprazole) .Marland Kitchen.. 1 once daily 3)  Flomax 0.4 Mg Cp24 (Tamsulosin hcl) .Marland Kitchen.. 1 once daily 4)  Lorazepam 0.5 Mg Tabs (Lorazepam) .Marland Kitchen.. 1 two times a day as needed 5)  Nitroquick 0.4 Mg Subl (Nitroglycerin) .... Use as dir as needed 6)  Symbicort 80-4.5 Mcg/act Aero (Budesonide-formoterol fumarate) .... Two puffs twice daily as needed 7)  Anacin 81 Mg Tbec (Aspirin) .... One daily 8)  Warfarin Sodium 2.5 Mg Tabs (Warfarin sodium) .... One daily 9)  Ipratropium-albuterol 0.5-2.5 (3) Mg/5ml Soln (Ipratropium-albuterol) .... Use three times daily as needed 10)  Furosemide 40 Mg Tabs (Furosemide) .... One half a tablet in the morning  monday wednesday, friday only 11)  Androgel Pump 1 % Gel (Testosterone) .... Use daily 12)  Tamsulosin Hcl 0.4 Mg Caps (Tamsulosin hcl) .... One daily 13)  Klor-con 10 10 Meq Cr-tabs (Potassium chloride) .... One daily 14)  Coreg 3.125 Mg Tabs (Carvedilol) .... One half tablet twice daily  Patient Instructions: 1)  Please schedule a follow-up appointment in 3 months. 2)  Limit your Sodium (Salt). 3)  It is important that you exercise regularly at least 20 minutes 5 times a week. If you develop chest pain, have severe difficulty breathing, or feel very tired , stop exercising immediately and seek medical attention.

## 2010-05-06 NOTE — Assessment & Plan Note (Signed)
Summary: trouble breathing//ccm   Vital Signs:  Patient profile:   77 year old male Weight:      117 pounds Temp:     98.0 degrees F oral BP sitting:   150 / 68  (left arm) Cuff size:   regular  Vitals Entered By: Kathrynn Speed CMA (November 26, 2009 3:42 PM) CC: trouble breathing, 2 ago, worse at night, src Is Patient Diabetic? No   Primary Care Provider:  Gordy Savers  MD  CC:  trouble breathing, 2 ago, worse at night, and src.  History of Present Illness:  77 year old patient who presents with a two to 3-day history of increasing shortness of breath.  He has a history of heart failure, as well as advanced COPD.  He has developed  mild, nonproductive cough, hoarseness and malaise.  He has become slightly more short of breath and has been using home oxygen therapy.  Denies any fever or chills. he has a history of chronic low back pain and leg pain due to advanced spinal stenosis.  He has self discontinued his analgesics because presently he is essentially pain free. He has chronic atrial fibrillation.  Current Medications (verified): 1)  Levothroid 50 Mcg  Tabs (Levothyroxine Sodium) .Marland Kitchen.. 1 Once Daily 2)  Eql Omeprazole 20 Mg  Tbec (Omeprazole) .Marland Kitchen.. 1 Once Daily 3)  Lorazepam 0.5 Mg  Tabs (Lorazepam) .Marland Kitchen.. 1 Two Times A Day As Needed 4)  Nitroquick 0.4 Mg  Subl (Nitroglycerin) .... Use As Dir As Needed 5)  Symbicort 80-4.5 Mcg/act  Aero (Budesonide-Formoterol Fumarate) .... Two Puffs Twice Daily As Needed 6)  Anacin 81 Mg Tbec (Aspirin) .... One Daily 7)  Ipratropium-Albuterol 0.5-2.5 (3) Mg/23ml Soln (Ipratropium-Albuterol) .... Use Three Times Daily As Needed 8)  Furosemide 40 Mg Tabs (Furosemide) .... One Half A Tablet in The Morning  Monday Wednesday, Friday Only 9)  Tamsulosin Hcl 0.4 Mg Caps (Tamsulosin Hcl) .... One Daily 10)  Klor-Con 10 10 Meq Cr-Tabs (Potassium Chloride) .... One Daily 11)  Coreg 3.125 Mg Tabs (Carvedilol) .... One Half Tablet Twice Daily 12)   Tramadol Hcl 50 Mg Tabs (Tramadol Hcl) .... One Every 6 Hours As Needed For Pain 13)  Hydrocodone-Acetaminophen 5-500 Mg Tabs (Hydrocodone-Acetaminophen) .... One Every 6 Hours For Pain 14)  Ipratropium Bromide 0.02 % Soln (Ipratropium Bromide) .... Use Two Times A Day 15)  Avodart 0.5 Mg Caps (Dutasteride) .... One Daily 16)  Fentanyl 25 Mcg/hr Pt72 (Fentanyl) .... Use Every 72 Hours 17)  Plavix 75 Mg Tabs (Clopidogrel Bisulfate) .... One Daily  Allergies (verified): 1)  ! Novocain (Procaine Hcl)  Past History:  Past Medical History: Reviewed history from 08/30/2009 and no changes required. Atrial fibrillation COPD Hypertension Hypothyroidism Peptic ulcer disease Peripheral vascular disease DJD Congestive heart failure Coronary artery disease left brain Cardioembolic stroke May 2010 Osteoarthritis spinal stenosis and multilevel foraminal stenosis  Review of Systems       The patient complains of anorexia, hoarseness, dyspnea on exertion, prolonged cough, and muscle weakness.  The patient denies fever, weight loss, weight gain, vision loss, decreased hearing, chest pain, syncope, peripheral edema, headaches, hemoptysis, abdominal pain, melena, hematochezia, severe indigestion/heartburn, hematuria, incontinence, genital sores, suspicious skin lesions, transient blindness, difficulty walking, depression, unusual weight change, abnormal bleeding, enlarged lymph nodes, angioedema, breast masses, and testicular masses.    Physical Exam  General:  elderly alert, appears unwell, but in no acute distress.  Blood pressure 130/70, controlled ventricular response Head:  Normocephalic and atraumatic without obvious  abnormalities. No apparent alopecia or balding. Eyes:  No corneal or conjunctival inflammation noted. EOMI. Perrla. Funduscopic exam benign, without hemorrhages, exudates or papilledema. Vision grossly normal. Mouth:  Oral mucosa and oropharynx without lesions or exudates.  Teeth  in good repair. Neck:  No deformities, masses, or tenderness noted. Chest Wall:  No deformities, masses, tenderness or gynecomastia noted. Lungs:  Normal respiratory effort, chest expands symmetrically. Lungs are clear to auscultation, no crackles or wheezes.  O2 saturation 93,94% Heart:  irregular rhythm with controlled ventricular response Abdomen:  Bowel sounds positive,abdomen soft and non-tender without masses, organomegaly or hernias noted. Msk:  No deformity or scoliosis noted of thoracic or lumbar spine.   Extremities:  no edema   Impression & Recommendations:  Problem # 1:  URI (ICD-465.9)  His updated medication list for this problem includes:    Anacin 81 Mg Tbec (Aspirin) ..... One daily  Problem # 2:  DYSPNEA (ICD-786.05)  Problem # 3:  CHRONIC OBSTRUCTIVE PULMONARY DISEASE, ACUTE EXACERBATION (ICD-491.21)  Complete Medication List: 1)  Levothroid 50 Mcg Tabs (Levothyroxine sodium) .Marland Kitchen.. 1 once daily 2)  Eql Omeprazole 20 Mg Tbec (Omeprazole) .Marland Kitchen.. 1 once daily 3)  Lorazepam 0.5 Mg Tabs (Lorazepam) .Marland Kitchen.. 1 two times a day as needed 4)  Nitroquick 0.4 Mg Subl (Nitroglycerin) .... Use as dir as needed 5)  Symbicort 80-4.5 Mcg/act Aero (Budesonide-formoterol fumarate) .... Two puffs twice daily as needed 6)  Anacin 81 Mg Tbec (Aspirin) .... One daily 7)  Ipratropium-albuterol 0.5-2.5 (3) Mg/22ml Soln (Ipratropium-albuterol) .... Use three times daily as needed 8)  Furosemide 40 Mg Tabs (Furosemide) .... One half a tablet in the morning  monday wednesday, friday only 9)  Tamsulosin Hcl 0.4 Mg Caps (Tamsulosin hcl) .... One daily 10)  Klor-con 10 10 Meq Cr-tabs (Potassium chloride) .... One daily 11)  Coreg 3.125 Mg Tabs (Carvedilol) .... One half tablet twice daily 12)  Tramadol Hcl 50 Mg Tabs (Tramadol hcl) .... One every 6 hours as needed for pain 13)  Hydrocodone-acetaminophen 5-500 Mg Tabs (Hydrocodone-acetaminophen) .... One every 6 hours for pain 14)  Ipratropium  Bromide 0.02 % Soln (Ipratropium bromide) .... Use two times a day 15)  Avodart 0.5 Mg Caps (Dutasteride) .... One daily 16)  Fentanyl 25 Mcg/hr Pt72 (Fentanyl) .... Use every 72 hours 17)  Plavix 75 Mg Tabs (Clopidogrel bisulfate) .... One daily 18)  Miralax Powd (Polyethylene glycol 3350) .... One scoop in 8 ounces of water once or twice daily for constipation  Patient Instructions: 1)  Please schedule a follow-up appointment in 1 month. 2)  Limit your Sodium (Salt) to less than 2 grams a day(slightly less than 1/2 a teaspoon) to prevent fluid retention, swelling, or worsening of symptoms. 3)  Get plenty of rest, drink lots of clear liquids, and use Tylenol or Ibuprofen for fever and comfort. Return in 7-10 days if you're not better:sooner if you're feeling worse.

## 2010-05-06 NOTE — Letter (Signed)
Summary: [facility]  [facility]   Imported By: Maryln Gottron 02/21/2010 10:03:09  _____________________________________________________________________  External Attachment:    Type:   Image     Comment:   External Document

## 2010-05-06 NOTE — Progress Notes (Signed)
Summary: questions about K  Phone Note Call from Patient   Caller: Patient Call For: Gordy Savers  MD Summary of Call: Pt is still having weakness in his legs, and was off his K x 2 days, and back on it today.Johnnette Barrios if that could be the reason? [phone]  Initial call taken by: [name] CMA,  October 04, 2009 8:23 AM  Follow-up for Phone Call        continue K Follow-up by: Gordy Savers  MD,  October 04, 2009 12:05 PM  Additional Follow-up for Phone Call Additional follow up Details #1::        Pt is going to continue K. Additional Follow-up by: [name] CMA,  October 04, 2009 3:03 PM

## 2010-05-06 NOTE — Assessment & Plan Note (Signed)
Summary: difficulty breathing - on o2    kik   Primary Care Provider:  Gordy Savers  MD   History of Present Illness: 77 year old patient who has a history of chronic atrial fibrillation, COPD and congestive heart failure.  He presents complaining of increasing shortness of breath over the past few days.  He states that he has been taking Coreg only once daily.  Denies any orthopnea, pedal edema, weight gain.  Denies any chest pain  Allergies: 1)  ! Novocain (Procaine Hcl)  Past History:  Past Medical History: Reviewed history from 08/30/2009 and no changes required. Atrial fibrillation COPD Hypertension Hypothyroidism Peptic ulcer disease Peripheral vascular disease DJD Congestive heart failure Coronary artery disease left brain Cardioembolic stroke May 2010 Osteoarthritis spinal stenosis and multilevel foraminal stenosis  Review of Systems       The patient complains of dyspnea on exertion.  The patient denies anorexia, fever, weight loss, weight gain, vision loss, decreased hearing, hoarseness, chest pain, syncope, peripheral edema, prolonged cough, headaches, hemoptysis, abdominal pain, melena, hematochezia, severe indigestion/heartburn, hematuria, incontinence, genital sores, muscle weakness, suspicious skin lesions, transient blindness, difficulty walking, depression, unusual weight change, abnormal bleeding, enlarged lymph nodes, angioedema, breast masses, and testicular masses.    Physical Exam  General:  elderly frail weak; blood pressure 130/74 Mouth:  Oral mucosa and oropharynx without lesions or exudates.  Teeth in good repair. Neck:  no neck vein distention Lungs:  chest was clear sounds may have been slight diminished at the left base O2 saturation 97% Heart:  irregular regular, pulse rate 124 Abdomen:  Bowel sounds positive,abdomen soft and non-tender without masses, organomegaly or hernias noted. Extremities:  no edema   Impression &  Recommendations:  Problem # 1:  ATRIAL FIBRILLATION (ICD-427.31)  His updated medication list for this problem includes:    Coreg 3.125 Mg Tabs (Carvedilol) .Marland Kitchen..Marland Kitchen Two   tablet twice daily    Plavix 75 Mg Tabs (Clopidogrel bisulfate) ..... One daily    Digoxin 0.125 Mg Tabs (Digoxin) ..... One daily patient has uncontrolled atrial fibrillation.  Options were discussed, including, hospital admission.  He wishes to defer unless he worsens, will titrate rate control medications  His updated medication list for this problem includes:    Coreg 3.125 Mg Tabs (Carvedilol) .Marland Kitchen..Marland Kitchen Two   tablet twice daily    Plavix 75 Mg Tabs (Clopidogrel bisulfate) ..... One daily    Digoxin 0.125 Mg Tabs (Digoxin) ..... One daily  Problem # 2:  DYSPNEA (ICD-786.05) hopefully  this will improve with control of his ventricular response  Problem # 3:  CHRONIC SYSTOLIC HEART FAILURE (ICD-428.22)  His updated medication list for this problem includes:    Furosemide 40 Mg Tabs (Furosemide) ..... One half a tablet in the morning  monday wednesday, friday only    Coreg 3.125 Mg Tabs (Carvedilol) .Marland Kitchen..Marland Kitchen Two   tablet twice daily    Plavix 75 Mg Tabs (Clopidogrel bisulfate) ..... One daily    Digoxin 0.125 Mg Tabs (Digoxin) ..... One daily  His updated medication list for this problem includes:    Furosemide 40 Mg Tabs (Furosemide) ..... One half a tablet in the morning  monday wednesday, friday only    Coreg 3.125 Mg Tabs (Carvedilol) .Marland Kitchen..Marland Kitchen Two   tablet twice daily    Plavix 75 Mg Tabs (Clopidogrel bisulfate) ..... One daily    Digoxin 0.125 Mg Tabs (Digoxin) ..... One daily  Complete Medication List: 1)  Levothroid 50 Mcg Tabs (Levothyroxine sodium) .Marland Kitchen.. 1 once  daily 2)  Eql Omeprazole 20 Mg Tbec (Omeprazole) .Marland Kitchen.. 1 once daily 3)  Lorazepam 0.5 Mg Tabs (Lorazepam) .Marland Kitchen.. 1 two times a day as needed 4)  Nitroquick 0.4 Mg Subl (Nitroglycerin) .... Use as dir as needed 5)  Symbicort 80-4.5 Mcg/act Aero  (Budesonide-formoterol fumarate) .... Two puffs twice daily as needed 6)  Ipratropium-albuterol 0.5-2.5 (3) Mg/67ml Soln (Ipratropium-albuterol) .... Use three times daily as needed 7)  Furosemide 40 Mg Tabs (Furosemide) .... One half a tablet in the morning  monday wednesday, friday only 8)  Tamsulosin Hcl 0.4 Mg Caps (Tamsulosin hcl) .... One daily 9)  Klor-con 10 10 Meq Cr-tabs (Potassium chloride) .... One daily 10)  Coreg 3.125 Mg Tabs (Carvedilol) .... Two   tablet twice daily 11)  Tramadol Hcl 50 Mg Tabs (Tramadol hcl) .... One every 6 hours as needed for pain 12)  Avodart 0.5 Mg Caps (Dutasteride) .... One daily 13)  Fentanyl 25 Mcg/hr Pt72 (Fentanyl) .... Use every 72 hours 14)  Plavix 75 Mg Tabs (Clopidogrel bisulfate) .... One daily 15)  Miralax Powd (Polyethylene glycol 3350) .... One scoop in 8 ounces of water once or twice daily for constipation 16)  Ciprofloxacin Hcl 500 Mg Tabs (Ciprofloxacin hcl) .... One twice daily 17)  Digoxin 0.125 Mg Tabs (Digoxin) .... One daily  Patient Instructions: 1)  Please schedule a follow-up appointment in 1 week. 2)  report to the emergency room immediately if there is any worsening shortness of breath Prescriptions: DIGOXIN 0.125 MG TABS (DIGOXIN) one daily  #90 x 0   Entered and Authorized by:   Gordy Savers  MD   Signed by:   Gordy Savers  MD on 02/24/2010   Method used:   Electronically to        [facility]  [phone]* (retail)       [address] [place], [place]  [address]       Ph: [phone] or [phone]       Fax: [phone]   RxID:   [id number]    Orders Added: 1)  Est. Patient Level IV [34742]

## 2010-05-06 NOTE — Progress Notes (Signed)
Summary: Update to Dr. Kirtland Bouchard  Phone Note Call from Patient   Caller: Patient Call For: Gordy Savers  MD Summary of Call: Pt wants Dr. Kirtland Bouchard to know he can "hardly" walk and does not what to do. [phone] Initial call taken by: [name] CMA,  October 16, 2009 9:46 AM  Follow-up for Phone Call        called  Follow-up by: Gordy Savers  MD,  October 16, 2009 12:21 PM

## 2010-05-06 NOTE — Progress Notes (Signed)
Summary: URI/diarrhea  Phone Note Call from Patient Call back at Home Phone [phone]   Caller: Patient Call For: Gordy Savers  MD Summary of Call: Pt is calling asking for RX for URI, SOB and diarrhea. Please call pt[facility]  [facility] Initial call taken by: [name] CMA,  November 28, 2009 3:43 PM  Follow-up for Phone Call        called and left message Follow-up by: Gordy Savers  MD,  November 28, 2009 5:25 PM

## 2010-05-06 NOTE — Progress Notes (Signed)
Summary: refill androgel  Phone Note Refill Request Message from:  Fax from Pharmacy on Aug 08, 2009 12:34 PM  Refills Requested: Medication #1:  ANDROGEL PUMP 1 % GEL use daily   Last Refilled: 03/25/2009 [facility]   ph [phone]  Initial call taken by: Duard Brady LPN,  Aug 09, 4130 12:34 PM  Follow-up for Phone Call        3 pumps  RF 6 Follow-up by: Gordy Savers  MD,  Aug 08, 2009 12:56 PM    Prescriptions: ANDROGEL PUMP 1 % GEL (TESTOSTERONE) use daily  #3 pumps x 4   Entered by:   Duard Brady LPN   Authorized by:   Gordy Savers  MD   Signed by:   Duard Brady LPN on 44/04/270   Method used:   Historical   RxID:   [id number]  [facility]

## 2010-05-06 NOTE — Assessment & Plan Note (Signed)
Summary: diff breathing/pt went to ER/cjr   Vital Signs:  Patient profile:   77 year old male Weight:      118 pounds Temp:     97.3 degrees F BP sitting:   138 / 80  (right arm) Cuff size:   regular  Vitals Entered By: Duard Brady LPN (December 10, 2009 4:09 PM) CC: f/u from Er Is Patient Diabetic? No   Primary Care Provider:  Gordy Savers  MD  CC:  f/u from Er.  History of Present Illness: 77 year old patient who has a history of advanced COPD as well as history of systolic heart failure.  He was seen at the ED 4 days ago, where chest x-ray revealed some atelectatic  changes of the left base.  Laboratory studies were unremarkable.  He has completed  azithromycin and oral prednisone, and today feels quite well.  He does use home O2 p.r.n. shortness of breath.  Denies any productive cough;  denies any fever.  ED records were reviewed.  BNP was modestly elevated  Allergies: 1)  ! Novocain (Procaine Hcl)  Past History:  Past Medical History: Reviewed history from 08/30/2009 and no changes required. Atrial fibrillation COPD Hypertension Hypothyroidism Peptic ulcer disease Peripheral vascular disease DJD Congestive heart failure Coronary artery disease left brain Cardioembolic stroke May 2010 Osteoarthritis spinal stenosis and multilevel foraminal stenosis  Review of Systems       The patient complains of anorexia, hoarseness, and prolonged cough.  The patient denies fever, weight loss, weight gain, vision loss, decreased hearing, chest pain, syncope, dyspnea on exertion, peripheral edema, headaches, hemoptysis, abdominal pain, melena, hematochezia, severe indigestion/heartburn, hematuria, incontinence, genital sores, muscle weakness, suspicious skin lesions, transient blindness, difficulty walking, depression, unusual weight change, abnormal bleeding, enlarged lymph nodes, angioedema, breast masses, and testicular masses.    Physical Exam  General:  alert  frail, no distress.  Blood pressure, normal Head:  Normocephalic and atraumatic without obvious abnormalities. No apparent alopecia or balding. Eyes:  No corneal or conjunctival inflammation noted. EOMI. Perrla. Funduscopic exam benign, without hemorrhages, exudates or papilledema. Vision grossly normal. Mouth:  Oral mucosa and oropharynx without lesions or exudates.  Teeth in good repair. Neck:  No deformities, masses, or tenderness noted. Lungs:  few crackles at the left base  O2 saturation 97% Heart:  controlled ventricular response Abdomen:  Bowel sounds positive,abdomen soft and non-tender without masses, organomegaly or hernias noted. Extremities:  no edema   Impression & Recommendations:  Problem # 1:  CHRONIC OBSTRUCTIVE PULMONARY DISEASE, ACUTE EXACERBATION (ICD-491.21)  Problem # 2:  CHRONIC SYSTOLIC HEART FAILURE (ICD-428.22)  His updated medication list for this problem includes:    Anacin 81 Mg Tbec (Aspirin) ..... One daily    Furosemide 40 Mg Tabs (Furosemide) ..... One half a tablet in the morning  monday wednesday, friday only    Coreg 3.125 Mg Tabs (Carvedilol) ..... One half tablet twice daily    Plavix 75 Mg Tabs (Clopidogrel bisulfate) ..... One daily  His updated medication list for this problem includes:    Anacin 81 Mg Tbec (Aspirin) ..... One daily    Furosemide 40 Mg Tabs (Furosemide) ..... One half a tablet in the morning  monday wednesday, friday only    Coreg 3.125 Mg Tabs (Carvedilol) ..... One half tablet twice daily    Plavix 75 Mg Tabs (Clopidogrel bisulfate) ..... One daily  Complete Medication List: 1)  Levothroid 50 Mcg Tabs (Levothyroxine sodium) .Marland Kitchen.. 1 once daily 2)  Eql Omeprazole  20 Mg Tbec (Omeprazole) .Marland Kitchen.. 1 once daily 3)  Lorazepam 0.5 Mg Tabs (Lorazepam) .Marland Kitchen.. 1 two times a day as needed 4)  Nitroquick 0.4 Mg Subl (Nitroglycerin) .... Use as dir as needed 5)  Symbicort 80-4.5 Mcg/act Aero (Budesonide-formoterol fumarate) .... Two puffs  twice daily as needed 6)  Anacin 81 Mg Tbec (Aspirin) .... One daily 7)  Ipratropium-albuterol 0.5-2.5 (3) Mg/93ml Soln (Ipratropium-albuterol) .... Use three times daily as needed 8)  Furosemide 40 Mg Tabs (Furosemide) .... One half a tablet in the morning  monday wednesday, friday only 9)  Tamsulosin Hcl 0.4 Mg Caps (Tamsulosin hcl) .... One daily 10)  Klor-con 10 10 Meq Cr-tabs (Potassium chloride) .... One daily 11)  Coreg 3.125 Mg Tabs (Carvedilol) .... One half tablet twice daily 12)  Tramadol Hcl 50 Mg Tabs (Tramadol hcl) .... One every 6 hours as needed for pain 13)  Hydrocodone-acetaminophen 5-500 Mg Tabs (Hydrocodone-acetaminophen) .... One every 6 hours for pain 14)  Ipratropium Bromide 0.02 % Soln (Ipratropium bromide) .... Use two times a day 15)  Avodart 0.5 Mg Caps (Dutasteride) .... One daily 16)  Fentanyl 25 Mcg/hr Pt72 (Fentanyl) .... Use every 72 hours 17)  Plavix 75 Mg Tabs (Clopidogrel bisulfate) .... One daily 18)  Miralax Powd (Polyethylene glycol 3350) .... One scoop in 8 ounces of water once or twice daily for constipation 19)  Azithromycin 250 Mg Tabs (Azithromycin) .... Two initially, then one daily for 4 additional days  Patient Instructions: 1)  Limit your Sodium (Salt). 2)  return as scheduled for follow-up 3)  Follow-up if  there is any worsening of your condition

## 2010-05-06 NOTE — Progress Notes (Signed)
Summary: back pain  Phone Note Call from Patient   Caller: Patient Call For: Gordy Savers  MD Summary of Call: Pt is asking for stronger pain meds.  The Tramadol is not helping his back pain. [facility]) Initial call taken by: [name] CMA,  Aug 27, 2009 1:50 PM  Follow-up for Phone Call        called vicodin in Follow-up by: Gordy Savers  MD,  Aug 28, 2009 8:08 AM    New/Updated Medications: HYDROCODONE-ACETAMINOPHEN 5-500 MG TABS (HYDROCODONE-ACETAMINOPHEN) one every 6 hours for pain Prescriptions: HYDROCODONE-ACETAMINOPHEN 5-500 MG TABS (HYDROCODONE-ACETAMINOPHEN) one every 6 hours for pain  #50 x 0   Entered and Authorized by:   Gordy Savers  MD   Signed by:   Gordy Savers  MD on 08/28/2009   Method used:   Handwritten   RxID:   [id number]

## 2010-05-06 NOTE — Assessment & Plan Note (Signed)
Summary: BACK & LEG PAIN/PS   Vital Signs:  Patient profile:   77 year old male Weight:      131 pounds Temp:     98.0 degrees F oral BP sitting:   136 / 76  Vitals Entered By: Duard Brady LPN (Aug 15, 2009 10:47 AM) CC: c.o no improvement with leg pain and weakness - stumbling ,unsteady gait   Primary Care Provider:  Gordy Savers  MD  CC:  c.o no improvement with leg pain and weakness - stumbling  and unsteady gait.  History of Present Illness: 77 year old patient who has had a history of prior laminectomies on 3 prior occasions.  His last one was done in [facility] in 1998.  He does have flares of chronic low back pain. occasionally pain radiates down the left leg.  This is his third office visit within the past week due to persistent pain in the calves.  He describes a constant, calf pain that is aggravated by standing and also by walking.  The pain has felt to be more musculoligamentous and not true claudication, but he does have a history of peripheral vascular disease.  He denies much in the way of constitutional complaints.  Allergies: 1)  ! Novocain (Procaine Hcl)  Past History:  Past Medical History: Atrial fibrillation COPD Hypertension Hypothyroidism Peptic ulcer disease Peripheral vascular disease DJD Congestive heart failure Coronary artery disease left brain Cardioembolic stroke May 2010 Osteoarthritis  Past Surgical History: Appendectomy Inguinal herniorrhaphy Tonsillectomy Abdominal aortic aneurysm repair Lumbar laminectomy  1967, 1977, 1998  Transurethral resection of prostate   Review of Systems       The patient complains of difficulty walking.  The patient denies anorexia, fever, weight loss, weight gain, vision loss, decreased hearing, hoarseness, chest pain, syncope, dyspnea on exertion, peripheral edema, prolonged cough, headaches, hemoptysis, abdominal pain, melena, hematochezia, severe indigestion/heartburn, hematuria,  incontinence, genital sores, muscle weakness, suspicious skin lesions, transient blindness, depression, unusual weight change, abnormal bleeding, enlarged lymph nodes, angioedema, breast masses, and testicular masses.    Physical Exam  General:  Well-developed,well-nourished,in no acute distress; alert,appropriate and cooperative throughout examination; normal blood pressure Msk:  no calf tenderness Pulses:  dorsalis pedis pulses remained normal; posterior tibial pulses are absent Extremities:  No clubbing, cyanosis, edema, or deformity noted with normal full range of motion of all joints.     Impression & Recommendations:  Problem # 1:  LEG PAIN, BILATERAL (ICD-729.5)  Orders: Radiology Referral (Radiology) Sedimentation Rate, non-automated (16109) LE Arterial Doppler/ABI (Le arterial doppler)  Complete Medication List: 1)  Levothroid 50 Mcg Tabs (Levothyroxine sodium) .Marland Kitchen.. 1 once daily 2)  Eql Omeprazole 20 Mg Tbec (Omeprazole) .Marland Kitchen.. 1 once daily 3)  Flomax 0.4 Mg Cp24 (Tamsulosin hcl) .Marland Kitchen.. 1 once daily 4)  Lorazepam 0.5 Mg Tabs (Lorazepam) .Marland Kitchen.. 1 two times a day as needed 5)  Nitroquick 0.4 Mg Subl (Nitroglycerin) .... Use as dir as needed 6)  Symbicort 80-4.5 Mcg/act Aero (Budesonide-formoterol fumarate) .... Two puffs twice daily as needed 7)  Anacin 81 Mg Tbec (Aspirin) .... One daily 8)  Warfarin Sodium 2.5 Mg Tabs (Warfarin sodium) .... One daily 9)  Ipratropium-albuterol 0.5-2.5 (3) Mg/60ml Soln (Ipratropium-albuterol) .... Use three times daily as needed 10)  Furosemide 40 Mg Tabs (Furosemide) .... One half a tablet in the morning  monday wednesday, friday only 11)  Androgel Pump 1 % Gel (Testosterone) .... Use daily 12)  Tamsulosin Hcl 0.4 Mg Caps (Tamsulosin hcl) .... One daily 13)  Klor-con 10 10 Meq Cr-tabs (Potassium chloride) .... One daily 14)  Coreg 3.125 Mg Tabs (Carvedilol) .... One half tablet twice daily 15)  Tramadol Hcl 50 Mg Tabs (Tramadol hcl) .... One every  6 hours as needed for pain 16)  Cymbalta 30 Mg Cpep (Duloxetine hcl) .... One every am  Other Orders: Venipuncture (16109)  Patient Instructions: 1)  lumbar MRI as scheduled 2)  Please schedule a follow-up appointment in 1 month.  Laboratory Results   Blood Tests   Date/Time Recieved: Aug 15, 2009 1:14 PM  Date/Time Reported: Aug 15, 2009 1:14 PM   SED rate: 2  Comments: Wynona Canes, CMA  Aug 15, 2009 1:14 PM

## 2010-05-06 NOTE — Progress Notes (Signed)
Summary: fall with no injury  Phone Note From Other Clinic   Caller: [facility] Call For: Dr. Kirtland Bouchard Summary of Call: [facility] is calling to let Dr. Kirtland Bouchard know that this pt had a fall with no injuries. Initial call taken by: [name] CMA,  October 08, 2009 1:48 PM  Follow-up for Phone Call        will forward to Dr. Frederica Kuster to review when back in office next week. KIK Follow-up by: Duard Brady LPN,  October 08, 1608 2:00 PM

## 2010-05-06 NOTE — Assessment & Plan Note (Signed)
Summary: back pain/dm   Vital Signs:  Patient profile:   77 year old male Weight:      123 pounds Temp:     98.2 degrees F oral BP sitting:   130 / 74  (right arm) Cuff size:   regular  Vitals Entered By: Duard Brady LPN (September 16, 2009 12:51 PM) CC: c/o back pain Is Patient Diabetic? No   Primary Care Provider:  Gordy Savers  MD  CC:  c/o back pain.  History of Present Illness: 77 year old patient who is seen today for follow-up of his back and leg pain secondary to spinal stenosis.  He received a single epidural and only had a very brief benefit probably related to the local anesthetic.  It was felt that he would not benefit from further epidural attempts.  He has had two acupuncture treatments, and today his back pain has remarkably improved.  He does have oral analgesics as needed.  He has cardiac and pulmonary disease, which have been stable  Allergies: 1)  ! Novocain (Procaine Hcl)  Past History:  Past Medical History: Reviewed history from 08/30/2009 and no changes required. Atrial fibrillation COPD Hypertension Hypothyroidism Peptic ulcer disease Peripheral vascular disease DJD Congestive heart failure Coronary artery disease left brain Cardioembolic stroke May 2010 Osteoarthritis spinal stenosis and multilevel foraminal stenosis  Physical Exam  General:  Well-developed,well-nourished,in no acute distress; alert,appropriate and cooperative throughout examination Neck:  No deformities, masses, or tenderness noted. Lungs:  diminished breath sounds, but clear normal inspiratory effort Heart:  irregular rhythm with controlled ventricular response Extremities:  no clinical edema Neurologic:  abnormal gait.  ambulates with the assistance of a cane   Impression & Recommendations:  Problem # 1:  SPINAL STENOSIS (ICD-724.00)  Problem # 2:  LEG PAIN, BILATERAL (ICD-729.5)  Problem # 3:  ATRIAL FIBRILLATION (ICD-427.31)  His updated medication  list for this problem includes:    Anacin 81 Mg Tbec (Aspirin) ..... One daily    Warfarin Sodium 2.5 Mg Tabs (Warfarin sodium) ..... One daily    Coreg 3.125 Mg Tabs (Carvedilol) ..... One half tablet twice daily  Complete Medication List: 1)  Levothroid 50 Mcg Tabs (Levothyroxine sodium) .Marland Kitchen.. 1 once daily 2)  Eql Omeprazole 20 Mg Tbec (Omeprazole) .Marland Kitchen.. 1 once daily 3)  Lorazepam 0.5 Mg Tabs (Lorazepam) .Marland Kitchen.. 1 two times a day as needed 4)  Nitroquick 0.4 Mg Subl (Nitroglycerin) .... Use as dir as needed 5)  Symbicort 80-4.5 Mcg/act Aero (Budesonide-formoterol fumarate) .... Two puffs twice daily as needed 6)  Anacin 81 Mg Tbec (Aspirin) .... One daily 7)  Warfarin Sodium 2.5 Mg Tabs (Warfarin sodium) .... One daily 8)  Ipratropium-albuterol 0.5-2.5 (3) Mg/59ml Soln (Ipratropium-albuterol) .... Use three times daily as needed 9)  Furosemide 40 Mg Tabs (Furosemide) .... One half a tablet in the morning  monday wednesday, friday only 10)  Androgel Pump 1 % Gel (Testosterone) .... Use daily 11)  Tamsulosin Hcl 0.4 Mg Caps (Tamsulosin hcl) .... One daily 12)  Klor-con 10 10 Meq Cr-tabs (Potassium chloride) .... One daily 13)  Coreg 3.125 Mg Tabs (Carvedilol) .... One half tablet twice daily 14)  Tramadol Hcl 50 Mg Tabs (Tramadol hcl) .... One every 6 hours as needed for pain 15)  Hydrocodone-acetaminophen 5-500 Mg Tabs (Hydrocodone-acetaminophen) .... One every 6 hours for pain  Patient Instructions: 1)  Please schedule a follow-up appointment in 3 months. 2)  Limit your Sodium (Salt) to less than 2 grams a day(slightly less than  1/2 a teaspoon) to prevent fluid retention, swelling, or worsening of symptoms.

## 2010-05-06 NOTE — Miscellaneous (Signed)
Summary: Orders Update  Clinical Lists Changes  Orders: Added new Test order of Arterial Duplex Lower Extremity (Arterial Duplex Low) - Signed 

## 2010-05-06 NOTE — Assessment & Plan Note (Signed)
Summary: leg pain//ccm   Vital Signs:  Patient profile:   77 year old male Weight:      131 pounds Temp:     97.9 degrees F oral BP sitting:   142 / 74  (right arm) Cuff size:   regular  Vitals Entered By: Duard Brady LPN (Aug 13, 1306 11:12 AM) CC: c.o leg pain and weakness   Primary Care Provider:  Gordy Savers  MD  CC:  c.o leg pain and weakness.  History of Present Illness: 77 -year-old patient who is seen today.  Complaint persistent leg pain.  This seems to be limited to the calf area.  There is seen 3 days ago for the same complaint and this was felt to be muscular.  He does state the pain is often aggravated by walking, but often is quite bothersome at rest.  He feels there is tenderness in the calf areas.  He now states this has been an intermittent problem for a few weeks.  He denies any true  claudication type symptoms, although does have a history of peripheral vascular disease.  Allergies: 1)  ! Novocain (Procaine Hcl)  Physical Exam  General:  77 alert, no distress.  Blood pressure 110/70 Abdomen:  femoral pulses, intact, without bruits Msk:  there is tenderness to gentle palpation in both calf areas that seemed   to reproduce the pain Pulses:  dorsalis pedis pulses are intact.  Posterior tibial pulses are nonpalpable Extremities:  no ischemic changes noted involving the feet   Impression & Recommendations:  Problem # 1:  LEG PAIN, BILATERAL (ICD-729.5) will continue Tylenol and tramadol for pain.  He was instructed on gentle stretching exercises with flexion and extension of the ankles  Complete Medication List: 1)  Levothroid 50 Mcg Tabs (Levothyroxine sodium) .Marland Kitchen.. 1 once daily 2)  Eql Omeprazole 20 Mg Tbec (Omeprazole) .Marland Kitchen.. 1 once daily 3)  Flomax 0.4 Mg Cp24 (Tamsulosin hcl) .Marland Kitchen.. 1 once daily 4)  Lorazepam 0.5 Mg Tabs (Lorazepam) .Marland Kitchen.. 1 two times a day as needed 5)  Nitroquick 0.4 Mg Subl (Nitroglycerin) .... Use as dir as needed 6)   Symbicort 80-4.5 Mcg/act Aero (Budesonide-formoterol fumarate) .... Two puffs twice daily as needed 7)  Anacin 81 Mg Tbec (Aspirin) .... One daily 8)  Warfarin Sodium 2.5 Mg Tabs (Warfarin sodium) .... One daily 9)  Ipratropium-albuterol 0.5-2.5 (3) Mg/65ml Soln (Ipratropium-albuterol) .... Use three times daily as needed 10)  Furosemide 40 Mg Tabs (Furosemide) .... One half a tablet in the morning  monday wednesday, friday only 11)  Androgel Pump 1 % Gel (Testosterone) .... Use daily 12)  Tamsulosin Hcl 0.4 Mg Caps (Tamsulosin hcl) .... One daily 13)  Klor-con 10 10 Meq Cr-tabs (Potassium chloride) .... One daily 14)  Coreg 3.125 Mg Tabs (Carvedilol) .... One half tablet twice daily 15)  Tramadol Hcl 50 Mg Tabs (Tramadol hcl) .... One every 6 hours as needed for pain  Patient Instructions: 1)  Take 650-1000mg  of Tylenol every 4-6 hours as needed for relief of pain or comfort of fever AVOID taking more than 4000mg   in a 24 hour period (can cause liver damage in higher doses). 2)   tramadol for pain as prescribed 3)  perform gentle stretching exercises with extension and flexion of your ankles  several times daily

## 2010-05-06 NOTE — Assessment & Plan Note (Signed)
Summary: frequent urination//ccm   Vital Signs:  Patient profile:   77 year old male Weight:      121 pounds Temp:     98.4 degrees F oral BP sitting:   110 / 70  (right arm) Cuff size:   regular  Vitals Entered By: Duard Brady LPN (February 05, 2010 11:25 AM) CC: c/o difficulty with urination through the night Is Patient Diabetic? No   Primary Care Martavis Gurney:  Gordy Savers  MD  CC:  c/o difficulty with urination through the night.  History of Present Illness: 77 year old patient who has a history of advanced COPD.  He also has a history of BPH, and apparently has had a TURP 10 to 12 years ago at [facility] last night.  He developed a difficulty with voiding with urgency, frequency, and dysuria.  The symptoms precipitated much more dyspnea.  Today he has had some frequent urination with low-volume and slow urinary stream.  He is on alpha blocker therapy, as well as Avodart  Allergies: 1)  ! Novocain (Procaine Hcl)  Past History:  Past Medical History: Reviewed history from 08/30/2009 and no changes required. Atrial fibrillation COPD Hypertension Hypothyroidism Peptic ulcer disease Peripheral vascular disease DJD Congestive heart failure Coronary artery disease left brain Cardioembolic stroke May 2010 Osteoarthritis spinal stenosis and multilevel foraminal stenosis  Review of Systems       The patient complains of dyspnea on exertion.  The patient denies anorexia, fever, weight loss, weight gain, vision loss, decreased hearing, hoarseness, chest pain, syncope, peripheral edema, prolonged cough, headaches, hemoptysis, abdominal pain, melena, hematochezia, severe indigestion/heartburn, hematuria, incontinence, genital sores, muscle weakness, suspicious skin lesions, transient blindness, difficulty walking, depression, unusual weight change, abnormal bleeding, enlarged lymph nodes, angioedema, and breast masses.    Physical Exam  General:   Well-developed,well-nourished,in no acute distress; alert,appropriate and cooperative throughout examination Neck:  No deformities, masses, or tenderness noted. Lungs:  clear O2 saturation 94% Heart:  Normal rate and regular rhythm. S1 and S2 normal without gallop, murmur, click, rub or other extra sounds. Extremities:  no edema   Impression & Recommendations:  Problem # 1:  BENIGN PROSTATIC HYPERTROPHY, WITH URINARY OBSTRUCTION (ICD-600.01)  His updated medication list for this problem includes:    Tamsulosin Hcl 0.4 Mg Caps (Tamsulosin hcl) ..... One daily    Avodart 0.5 Mg Caps (Dutasteride) ..... One daily  His updated medication list for this problem includes:    Tamsulosin Hcl 0.4 Mg Caps (Tamsulosin hcl) ..... One daily    Avodart 0.5 Mg Caps (Dutasteride) ..... One daily  Orders: Urology Referral (Urology)  Problem # 2:  OSTEOARTHRITIS (ICD-715.90)  His updated medication list for this problem includes:    Tramadol Hcl 50 Mg Tabs (Tramadol hcl) ..... One every 6 hours as needed for pain    Fentanyl 25 Mcg/hr Pt72 (Fentanyl) ..... Use every 72 hours  His updated medication list for this problem includes:    Tramadol Hcl 50 Mg Tabs (Tramadol hcl) ..... One every 6 hours as needed for pain    Fentanyl 25 Mcg/hr Pt72 (Fentanyl) ..... Use every 72 hours  Problem # 3:  CHRONIC OBSTRUCTIVE PULMONARY DISEASE, ACUTE EXACERBATION (ICD-491.21)  Complete Medication List: 1)  Levothroid 50 Mcg Tabs (Levothyroxine sodium) .Marland Kitchen.. 1 once daily 2)  Eql Omeprazole 20 Mg Tbec (Omeprazole) .Marland Kitchen.. 1 once daily 3)  Lorazepam 0.5 Mg Tabs (Lorazepam) .Marland Kitchen.. 1 two times a day as needed 4)  Nitroquick 0.4 Mg Subl (Nitroglycerin) .... Use  as dir as needed 5)  Symbicort 80-4.5 Mcg/act Aero (Budesonide-formoterol fumarate) .... Two puffs twice daily as needed 6)  Ipratropium-albuterol 0.5-2.5 (3) Mg/38ml Soln (Ipratropium-albuterol) .... Use three times daily as needed 7)  Furosemide 40 Mg Tabs  (Furosemide) .... One half a tablet in the morning  monday wednesday, friday only 8)  Tamsulosin Hcl 0.4 Mg Caps (Tamsulosin hcl) .... One daily 9)  Klor-con 10 10 Meq Cr-tabs (Potassium chloride) .... One daily 10)  Coreg 3.125 Mg Tabs (Carvedilol) .... One   tablet twice daily 11)  Tramadol Hcl 50 Mg Tabs (Tramadol hcl) .... One every 6 hours as needed for pain 12)  Avodart 0.5 Mg Caps (Dutasteride) .... One daily 13)  Fentanyl 25 Mcg/hr Pt72 (Fentanyl) .... Use every 72 hours 14)  Plavix 75 Mg Tabs (Clopidogrel bisulfate) .... One daily 15)  Miralax Powd (Polyethylene glycol 3350) .... One scoop in 8 ounces of water once or twice daily for constipation 16)  Ciprofloxacin Hcl 500 Mg Tabs (Ciprofloxacin hcl) .... One twice daily  Patient Instructions: 1)  urology   follow up as recommended Prescriptions: CIPROFLOXACIN HCL 500 MG TABS (CIPROFLOXACIN HCL) one twice daily  #14 x 0   Entered and Authorized by:   Gordy Savers  MD   Signed by:   Gordy Savers  MD on 02/05/2010   Method used:   Print then Give to Patient   RxID:   [id number]    Orders Added: 1)  Est. Patient Level III [46962] 2)  Urology Referral [Urology]  Appended Document: frequent urination//ccm  Laboratory Results   Urine Tests    Routine Urinalysis   Color: yellow Appearance: Clear Glucose: negative   (Normal Range: Negative) Bilirubin: negative   (Normal Range: Negative) Ketone: negative   (Normal Range: Negative) Spec. Gravity: 1.015   (Normal Range: 1.003-1.035) Blood: trace-lysed   (Normal Range: Negative) pH: 5.0   (Normal Range: 5.0-8.0) Protein: 1+   (Normal Range: Negative) Urobilinogen: 0.2   (Normal Range: 0-1) Nitrite: negative   (Normal Range: Negative) Leukocyte Esterace: negative   (Normal Range: Negative)    Comments: [name]  February 06, 2010 3:37 PM

## 2010-05-06 NOTE — Letter (Signed)
Summary: [facility]  [facility]   Imported By: Maryln Gottron 12/25/2009 13:55:02  _____________________________________________________________________  External Attachment:    Type:   Image     Comment:   External Document

## 2010-05-06 NOTE — Assessment & Plan Note (Signed)
Summary: leg pain//ccm   Vital Signs:  Patient profile:   77 year old male Weight:      123 pounds Temp:     98.0 degrees F oral BP sitting:   140 / 80  (right arm) Cuff size:   regular  Vitals Entered By: Kathrynn Speed CMA (September 20, 2009 10:43 AM) CC: Leg pain in both legs    Primary Care Provider:  Gordy Savers  MD  CC:  Leg pain in both legs .  History of Present Illness: 77 year old patient who is in today complaining of persistent leg pain.  His diagnosis of  spinal stenosis was discussed at length;  he has advanced cardiac and lung disease and not felt to be a surgical candidate.  A single epidural was performed, and it was felt further attempts would not be indicated.  He is on analgesic that he takes very sparingly, but does seem to be helpful.  He has tried acupuncture  Current Medications (verified): 1)  Levothroid 50 Mcg  Tabs (Levothyroxine Sodium) .Marland Kitchen.. 1 Once Daily 2)  Eql Omeprazole 20 Mg  Tbec (Omeprazole) .Marland Kitchen.. 1 Once Daily 3)  Lorazepam 0.5 Mg  Tabs (Lorazepam) .Marland Kitchen.. 1 Two Times A Day As Needed 4)  Nitroquick 0.4 Mg  Subl (Nitroglycerin) .... Use As Dir As Needed 5)  Symbicort 80-4.5 Mcg/act  Aero (Budesonide-Formoterol Fumarate) .... Two Puffs Twice Daily As Needed 6)  Anacin 81 Mg Tbec (Aspirin) .... One Daily 7)  Warfarin Sodium 2.5 Mg Tabs (Warfarin Sodium) .... One Daily 8)  Ipratropium-Albuterol 0.5-2.5 (3) Mg/20ml Soln (Ipratropium-Albuterol) .... Use Three Times Daily As Needed 9)  Furosemide 40 Mg Tabs (Furosemide) .... One Half A Tablet in The Morning  Monday Wednesday, Friday Only 10)  Androgel Pump 1 % Gel (Testosterone) .... Use Daily 11)  Tamsulosin Hcl 0.4 Mg Caps (Tamsulosin Hcl) .... One Daily 12)  Klor-Con 10 10 Meq Cr-Tabs (Potassium Chloride) .... One Daily 13)  Coreg 3.125 Mg Tabs (Carvedilol) .... One Half Tablet Twice Daily 14)  Tramadol Hcl 50 Mg Tabs (Tramadol Hcl) .... One Every 6 Hours As Needed For Pain 15)   Hydrocodone-Acetaminophen 5-500 Mg Tabs (Hydrocodone-Acetaminophen) .... One Every 6 Hours For Pain  Allergies (verified): 1)  ! Novocain (Procaine Hcl)  Physical Exam  General:  Well-developed,well-nourished,in no acute distress; alert,appropriate and cooperative throughout examination Lungs:  Normal respiratory effort, chest expands symmetrically. Lungs are clear to auscultation, no crackles or wheezes. Heart:  controlled ventricular response Extremities:  no edema   Impression & Recommendations:  Problem # 1:  SPINAL STENOSIS (ICD-724.00)  Problem # 2:  OSTEOARTHRITIS (ICD-715.90)  His updated medication list for this problem includes:    Anacin 81 Mg Tbec (Aspirin) ..... One daily    Tramadol Hcl 50 Mg Tabs (Tramadol hcl) ..... One every 6 hours as needed for pain    Hydrocodone-acetaminophen 5-500 Mg Tabs (Hydrocodone-acetaminophen) ..... One every 6 hours for pain  Complete Medication List: 1)  Levothroid 50 Mcg Tabs (Levothyroxine sodium) .Marland Kitchen.. 1 once daily 2)  Eql Omeprazole 20 Mg Tbec (Omeprazole) .Marland Kitchen.. 1 once daily 3)  Lorazepam 0.5 Mg Tabs (Lorazepam) .Marland Kitchen.. 1 two times a day as needed 4)  Nitroquick 0.4 Mg Subl (Nitroglycerin) .... Use as dir as needed 5)  Symbicort 80-4.5 Mcg/act Aero (Budesonide-formoterol fumarate) .... Two puffs twice daily as needed 6)  Anacin 81 Mg Tbec (Aspirin) .... One daily 7)  Warfarin Sodium 2.5 Mg Tabs (Warfarin sodium) .... One daily 8)  Ipratropium-albuterol 0.5-2.5 (3) Mg/48ml Soln (Ipratropium-albuterol) .... Use three times daily as needed 9)  Furosemide 40 Mg Tabs (Furosemide) .... One half a tablet in the morning  monday wednesday, friday only 10)  Androgel Pump 1 % Gel (Testosterone) .... Use daily 11)  Tamsulosin Hcl 0.4 Mg Caps (Tamsulosin hcl) .... One daily 12)  Klor-con 10 10 Meq Cr-tabs (Potassium chloride) .... One daily 13)  Coreg 3.125 Mg Tabs (Carvedilol) .... One half tablet twice daily 14)  Tramadol Hcl 50 Mg Tabs  (Tramadol hcl) .... One every 6 hours as needed for pain 15)  Hydrocodone-acetaminophen 5-500 Mg Tabs (Hydrocodone-acetaminophen) .... One every 6 hours for pain  Patient Instructions: 1)  Please schedule a follow-up appointment as needed.

## 2010-05-06 NOTE — Assessment & Plan Note (Signed)
Summary: low blood pressure/weak/fatigue/cjr   Vital Signs:  Patient profile:   77 year old male Weight:      126 pounds Temp:     97.8 degrees F oral BP sitting:   100 / 58  (left arm) BP standing:   90 / 50 Cuff size:   regular  Vitals Entered By: Sid Falcon LPN (June 05, 1608 1:54 PM)  Serial Vital Signs/Assessments:  Time      Position  BP       Pulse  Resp  Temp     By           Lying RA  105/60                         Evelena Peat MD           Standing  90/50                          Evelena Peat MD  CC: low BP, weak, fatigue   History of Present Illness: Patient seen with chief complaint of low blood pressure and feeling weak. Recent hospitalization for community-acquired left lower lobe pneumonia. Respiratory symptoms seem to have improved. He has underlying COPD. Home exercise technician this morning recorded blood pressure 70/50 and he has had some mild orthostatic symptoms. Drinking well but has had some nausea and poor appetite. No vomiting or diarrhea.  Blood pressure medications include carvedilol and furosemide. He takes regular potassium supplement. Generally feels very weak. No fever.  Chronic problems include chronic atrial fibrillation on Coumadin, COPD, hypertension, hypothyroidism, and history of CAD.  denies any recent bleeding complications. No abdominal pain. No dysuria and no stool changes  Allergies: 1)  ! Novocain (Procaine Hcl)  Past History:  Past Medical History: Last updated: 09/14/2008 Atrial fibrillation COPD Hypertension Hypothyroidism Peptic ulcer disease Peripheral vascular disease DJD Congestive heart failure Coronary artery disease left brain Cardioembolic stroke May 2010  Past Surgical History: Last updated: 08/11/2008 Appendectomy Inguinal herniorrhaphy Tonsillectomy Abdominal aortic aneurysm repair Lumbar laminectomy Transurethral resection of prostate   Social History: Last updated:  02/29/2008 Married spends considerable time at [facility] PMH-FH-SH reviewed for relevance  Review of Systems       The patient complains of anorexia.  The patient denies fever, weight loss, chest pain, syncope, dyspnea on exertion, peripheral edema, prolonged cough, headaches, hemoptysis, abdominal pain, melena, hematochezia, and severe indigestion/heartburn.    Physical Exam  General:  thin cachectic-appearing gentleman in no distress Ears:  External ear exam shows no significant lesions or deformities.  Otoscopic examination reveals clear canals, tympanic membranes are intact bilaterally without bulging, retraction, inflammation or discharge. Hearing is grossly normal bilaterally. Mouth:  Oral mucosa and oropharynx without lesions or exudates.  Teeth in good repair. Neck:  No deformities, masses, or tenderness noted. Lungs:  Normal respiratory effort, chest expands symmetrically. Lungs are clear to auscultation, no crackles or wheezes. Heart:  irregular irregular rhythm rate controlled Abdomen:  soft and non-tender.   Extremities:  no edema Neurologic:  alert & oriented X3 and cranial nerves II-XII intact.   Skin:  no ecchymosis Cervical Nodes:  No lymphadenopathy noted   Impression & Recommendations:  Problem # 1:  NAUSEA (ICD-787.02) check electrolytes. Orders: Venipuncture (96045) TLB-BMP (Basic Metabolic Panel-BMET) (80048-METABOL)  Problem # 2:  ORTHOSTATIC DIZZINESS (ICD-780.4) Assessment: New Hold Lasix and f/u with Dr Kirtland Bouchard in 2 days.  Check CBC.  Orders: Venipuncture (16109) TLB-CBC Platelet - w/Differential (85025-CBCD)  Complete Medication List: 1)  Levothroid 50 Mcg Tabs (Levothyroxine sodium) .Marland Kitchen.. 1 once daily 2)  Eql Omeprazole 20 Mg Tbec (Omeprazole) .Marland Kitchen.. 1 once daily 3)  Flomax 0.4 Mg Cp24 (Tamsulosin hcl) .Marland Kitchen.. 1 once daily 4)  Lorazepam 0.5 Mg Tabs (Lorazepam) .Marland Kitchen.. 1 two times a day as needed 5)  Nitroquick 0.4 Mg Subl (Nitroglycerin) .... Use as dir as  needed 6)  Symbicort 80-4.5 Mcg/act Aero (Budesonide-formoterol fumarate) .... Two puffs twice daily as needed 7)  Carvedilol 6.25 Tabs (carvedilol)  .... One half  tablet   twice daily 8)  Anacin 81 Mg Tbec (Aspirin) .... One daily 9)  Warfarin Sodium 2.5 Mg Tabs (Warfarin sodium) .... One daily 10)  Ipratropium-albuterol 0.5-2.5 (3) Mg/84ml Soln (Ipratropium-albuterol) .... Use three times daily as needed 11)  Furosemide 40 Mg Tabs (Furosemide) .... One half a tablet in the morning 12)  Androgel Pump 1 % Gel (Testosterone) .... Use daily 13)  Tamsulosin Hcl 0.4 Mg Caps (Tamsulosin hcl) .... One daily 14)  Klor-con 10 10 Meq Cr-tabs (Potassium chloride) .... One daily 15)  Hydrocodone-homatropine 5-1.5 Mg/44ml Syrp (Hydrocodone-homatropine) .... 0.5-teaspoon every 6 hours as needed for cough  Patient Instructions: 1)  Hold furosemide for the next few days 2)  drink more fluids. 3)  Schedule followup with Dr. Kirtland Bouchard in 2 days.

## 2010-05-06 NOTE — Progress Notes (Signed)
  Phone Note Call from Patient   Caller: Patient Call For: Gordy Savers  MD Summary of Call: D/C'd from hosp yesterday, feels too bad to come for labs- was supposed to have PT/INR today. Home nurse supposed to come tomorrow. I told him they could draw blood tomorrow. Has meds- abx. Initial call taken by: Raechel Ache, RN,  May 21, 2009 10:14 AM  Follow-up for Phone Call        OK Follow-up by: Gordy Savers  MD,  May 21, 2009 12:46 PM

## 2010-05-06 NOTE — Assessment & Plan Note (Signed)
Summary: POST HOSP F/U (AMS, EXTREMITY SPASMS) // RS   Vital Signs:  Patient profile:   76 year old male Weight:      128 pounds Temp:     98.1 degrees F oral BP sitting:   122 / 80  (right arm) Cuff size:   regular  Vitals Entered By: Duard Brady LPN (October 01, 2009 12:57 PM) CC: post hospital -  Is Patient Diabetic? No   Primary Care Provider:  Gordy Savers  MD  CC:  post hospital - .  History of Present Illness: 77 year old patient who is seen to a fall in a hospital discharge.  He has advanced COPD and chronic systolic heart failure.  She has chronic age fibrillation, on chronic Coumadin.  Hospital course was complicated by acute mental status changes, and hyponatremia.  He has resumed his furosemide dose at 20 mg Monday, Wednesday, Friday only.  He feels quite well today.  He is receiving outpatient physical therapy.  His back and leg pain are stable.  He has treated hypertension, arthritis, and severe spinal stenosis  Allergies: 1)  ! Novocain (Procaine Hcl)  Past History:  Past Medical History: Reviewed history from 08/30/2009 and no changes required. Atrial fibrillation COPD Hypertension Hypothyroidism Peptic ulcer disease Peripheral vascular disease DJD Congestive heart failure Coronary artery disease left brain Cardioembolic stroke May 2010 Osteoarthritis spinal stenosis and multilevel foraminal stenosis  Review of Systems       The patient complains of dyspnea on exertion, muscle weakness, and difficulty walking.  The patient denies anorexia, fever, weight loss, weight gain, vision loss, decreased hearing, hoarseness, chest pain, syncope, peripheral edema, prolonged cough, headaches, hemoptysis, abdominal pain, melena, hematochezia, severe indigestion/heartburn, hematuria, incontinence, genital sores, transient blindness, depression, unusual weight change, angioedema, breast masses, and testicular masses.    Physical Exam  General:  underweight  appearing.  120/72 Head:  Normocephalic and atraumatic without obvious abnormalities. No apparent alopecia or balding. Mouth:  Oral mucosa and oropharynx without lesions or exudates.  Teeth in good repair. Neck:  No deformities, masses, or tenderness noted. Lungs:  Normal respiratory effort, chest expands symmetrically. Lungs are clear to auscultation, no crackles or wheezes. O2 saturation 96 Heart:  irregular rhythm, with a rate in the mid-60s Abdomen:  Bowel sounds positive,abdomen soft and non-tender without masses, organomegaly or hernias noted. Extremities:  no edema   Impression & Recommendations:  Problem # 1:  CHRONIC SYSTOLIC HEART FAILURE (ICD-428.22)  His updated medication list for this problem includes:    Anacin 81 Mg Tbec (Aspirin) ..... One daily    Warfarin Sodium 2.5 Mg Tabs (Warfarin sodium) ..... One daily    Furosemide 40 Mg Tabs (Furosemide) ..... One half a tablet in the morning  monday wednesday, friday only    Coreg 3.125 Mg Tabs (Carvedilol) ..... One half tablet twice daily  Orders: Venipuncture (16109) TLB-BMP (Basic Metabolic Panel-BMET) (80048-METABOL)  Problem # 2:  SPINAL STENOSIS (ICD-724.00)  Problem # 3:  ATRIAL FIBRILLATION (ICD-427.31)  His updated medication list for this problem includes:    Anacin 81 Mg Tbec (Aspirin) ..... One daily    Warfarin Sodium 2.5 Mg Tabs (Warfarin sodium) ..... One daily    Coreg 3.125 Mg Tabs (Carvedilol) ..... One half tablet twice daily  Orders: Venipuncture (60454) TLB-BMP (Basic Metabolic Panel-BMET) (80048-METABOL)  Complete Medication List: 1)  Levothroid 50 Mcg Tabs (Levothyroxine sodium) .Marland Kitchen.. 1 once daily 2)  Eql Omeprazole 20 Mg Tbec (Omeprazole) .Marland Kitchen.. 1 once daily 3)  Lorazepam  0.5 Mg Tabs (Lorazepam) .Marland Kitchen.. 1 two times a day as needed 4)  Nitroquick 0.4 Mg Subl (Nitroglycerin) .... Use as dir as needed 5)  Symbicort 80-4.5 Mcg/act Aero (Budesonide-formoterol fumarate) .... Two puffs twice daily as  needed 6)  Anacin 81 Mg Tbec (Aspirin) .... One daily 7)  Warfarin Sodium 2.5 Mg Tabs (Warfarin sodium) .... One daily 8)  Ipratropium-albuterol 0.5-2.5 (3) Mg/61ml Soln (Ipratropium-albuterol) .... Use three times daily as needed 9)  Furosemide 40 Mg Tabs (Furosemide) .... One half a tablet in the morning  monday wednesday, friday only 10)  Androgel Pump 1 % Gel (Testosterone) .... Use daily 11)  Tamsulosin Hcl 0.4 Mg Caps (Tamsulosin hcl) .... One daily 12)  Klor-con 10 10 Meq Cr-tabs (Potassium chloride) .... One daily 13)  Coreg 3.125 Mg Tabs (Carvedilol) .... One half tablet twice daily 14)  Tramadol Hcl 50 Mg Tabs (Tramadol hcl) .... One every 6 hours as needed for pain 15)  Hydrocodone-acetaminophen 5-500 Mg Tabs (Hydrocodone-acetaminophen) .... One every 6 hours for pain  Other Orders: UA Dipstick w/o Micro (manual) (16109)  Patient Instructions: 1)  Please schedule a follow-up appointment in 2 months. 2)  Limit your Sodium (Salt). 3)  It is important that you exercise regularly at least 20 minutes 5 times a week. If you develop chest pain, have severe difficulty breathing, or feel very tired , stop exercising immediately and seek medical attention. Prescriptions: IPRATROPIUM-ALBUTEROL 0.5-2.5 (3) MG/3ML SOLN (IPRATROPIUM-ALBUTEROL) Use three times daily as needed  #62.5 Mill[place] x 6   Entered and Authorized by:   Gordy Savers  MD   Signed by:   Gordy Savers  MD on 10/01/2009   Method used:   Electronically to        [facility]  [phone]* (retail)       [address] [place], [place]  [address]       Ph: [phone] or [phone]       Fax: [phone]   RxID:   [id number]   Laboratory Results   Urine Tests  Date/Time Received: October 01, 2009 1:08 PM  Date/Time Reported: October 01, 2009 1:08 PM   Routine Urinalysis   Color: yellow Appearance: Clear Glucose: negative   (Normal Range: Negative) Bilirubin: negative   (Normal Range:  Negative) Ketone: negative   (Normal Range: Negative) Spec. Gravity: 1.015   (Normal Range: 1.003-1.035) Blood: negative   (Normal Range: Negative) pH: 7.0   (Normal Range: 5.0-8.0) Protein: trace   (Normal Range: Negative) Urobilinogen: 0.2   (Normal Range: 0-1) Nitrite: negative   (Normal Range: Negative) Leukocyte Esterace: negative   (Normal Range: Negative)

## 2010-05-06 NOTE — Progress Notes (Signed)
Summary: weakness  Phone Note Outgoing Call   Call placed by: Duard Brady LPN,  October 11, 4694 10:12 AM Call placed to: Patient Summary of Call: called pt per Dr. Nelida Meuse rquest to assess - after speaking with pt - he 'felt like he was going to fall' but didnt. I discussed that Dr. Amador Cunas not here until next week. He didnt really feel he needed to be seen today ,'they arent going to be doing anything'. I instructed to drink 1/2 glass (3oz) of water or gatoraid every hour until 4pm , and eat 3 small meals today. Call me in the morning and let me know how he is felling. Verbalized understanding. KIK Initial call taken by: Duard Brady LPN,  October 11, 2950 10:25 AM

## 2010-05-06 NOTE — Assessment & Plan Note (Signed)
Summary: PT/RCD   Nurse Visit   Allergies: 1)  ! Novocain (Procaine Hcl) Laboratory Results   Blood Tests   Date/Time Received: October 21, 2009 4:23 PM  Date/Time Reported: October 21, 2009 4:23 PM    INR: 1.3   (Normal Range: 0.88-1.12   Therap INR: 2.0-3.5) Comments: Wynona Canes, CMA  October 21, 2009 4:23 PM     Orders Added: 1)  Est. Patient Level I [99211] 2)  Protime [69485IO]  Laboratory Results   Blood Tests      INR: 1.3   (Normal Range: 0.88-1.12   Therap INR: 2.0-3.5) Comments: Wynona Canes, CMA  October 21, 2009 4:23 PM       ANTICOAGULATION RECORD PREVIOUS REGIMEN & LAB RESULTS Anticoagulation Diagnosis:  V58.83,V58.61,427.31 on  08/09/2009 Previous INR Goal Range:  2.0-3.0 on  08/09/2009 Previous INR:  1.9 on  09/27/2009 Previous Coumadin Dose(mg):  5mg  only on wed, then 2.5mg  x 6 days  on  08/09/2009 Previous Regimen:  same on  09/27/2009  NEW REGIMEN & LAB RESULTS Current INR: 1.3 Regimen: 5mg  on m,w,f 2.5mg  on other days       Repeat testing in: 2 weeks MEDICATIONS LEVOTHROID 50 MCG  TABS (LEVOTHYROXINE SODIUM) 1 once daily EQL OMEPRAZOLE 20 MG  TBEC (OMEPRAZOLE) 1 once daily LORAZEPAM 0.5 MG  TABS (LORAZEPAM) 1 two times a day as needed NITROQUICK 0.4 MG  SUBL (NITROGLYCERIN) use as dir as needed SYMBICORT 80-4.5 MCG/ACT  AERO (BUDESONIDE-FORMOTEROL FUMARATE) two puffs twice daily as needed ANACIN 81 MG TBEC (ASPIRIN) one daily WARFARIN SODIUM 2.5 MG TABS (WARFARIN SODIUM) one daily IPRATROPIUM-ALBUTEROL 0.5-2.5 (3) MG/3ML SOLN (IPRATROPIUM-ALBUTEROL) Use three times daily as needed FUROSEMIDE 40 MG TABS (FUROSEMIDE) one half a tablet in the morning  Monday Wednesday, Friday only TAMSULOSIN HCL 0.4 MG CAPS (TAMSULOSIN HCL) one daily KLOR-CON 10 10 MEQ CR-TABS (POTASSIUM CHLORIDE) one daily COREG 3.125 MG TABS (CARVEDILOL) one half tablet twice daily TRAMADOL HCL 50 MG TABS (TRAMADOL HCL) one every 6 hours as needed for  pain HYDROCODONE-ACETAMINOPHEN 5-500 MG TABS (HYDROCODONE-ACETAMINOPHEN) one every 6 hours for pain IPRATROPIUM BROMIDE 0.02 % SOLN (IPRATROPIUM BROMIDE) Use two times a day AVODART 0.5 MG CAPS (DUTASTERIDE) one daily   Anticoagulation Visit Questionnaire      Coumadin dose missed/changed:  No      Abnormal Bleeding Symptoms:  No   Any diet changes including alcohol intake, vegetables or greens since the last visit:  No Any illnesses or hospitalizations since the last visit:  No Any signs of clotting since the last visit (including chest discomfort, dizziness, shortness of breath, arm tingling, slurred speech, swelling or redness in leg):  No

## 2010-05-06 NOTE — Progress Notes (Signed)
Summary: weakness in legs.  Phone Note Call from Patient   Caller: Patient Call For: Gordy Savers  MD Summary of Call: Pt had one episode of his "legs completely giving away" last night, and is very upset about this.  Asking for advice. [phone]  Initial call taken by: [name] CMA,  Aug 13, 2009 11:24 AM  Follow-up for Phone Call        called and discussed;  Follow-up by: Gordy Savers  MD,  Aug 13, 2009 12:31 PM

## 2010-05-06 NOTE — Progress Notes (Signed)
Summary: leg pain  Phone Note Call from Patient   Caller: Patient Call For: Gordy Savers  MD Summary of Call: spoke with pt about leg pain worse this AM . Was instructed by Dr. Amador Cunas yesterday ok to take Aleve and Hydrocodone med together.  Instructed to take Hydrocodone q6hrs as ordered , Aleve in Am and then again this late afternoon. Drink plenty of water today, rest , legs elevated and if no improvment - to see. KIK Initial call taken by: Duard Brady LPN,  October 25, 2009 8:23 AM

## 2010-05-06 NOTE — Progress Notes (Signed)
Summary: awful cough & sleeping  Phone Note Call from Patient Call back at [phone]   Caller: vm Summary of Call: Awful cough & all he is doing is sleeping. Initial call taken by: [name], RN,  May 17, 2009 11:54 AM  Follow-up for Phone Call        force fluids, use delsym  one tsp every 12 hours  Follow-up by: Gordy Savers  MD,  May 17, 2009 12:43 PM  Additional Follow-up for Phone Call Additional follow up Details #1::        Phone Call Completed Additional Follow-up by: [name], RN,  May 17, 2009 1:01 PM

## 2010-05-06 NOTE — Assessment & Plan Note (Signed)
Summary: PROBLEMS URINATING // RS   Vital Signs:  Patient profile:   77 year old male Weight:      120 pounds Temp:     97.5 degrees F oral BP sitting:   110 / 62  (right arm) Cuff size:   regular  Vitals Entered By: Kathrynn Speed CMA (October 14, 2009 3:34 PM) CC: problems urinating,little burning, couldn't urine last night, pains in legs & feet/ src   Primary Care Provider:  Gordy Savers  MD  CC:  problems urinating, little burning, couldn't urine last night, and pains in legs & feet/ src.  History of Present Illness: 77 year old patient who has a history of BPH and is status post remote TURP;  for the past couple weeks, he has had some increasing voiding problems and at times has been unable to void.  He is on furosemide, Monday, Wednesday, Friday regimen.  He is also on Flomax 0.4 mg daily. He presently continues with PT/OT at home and states that he is receiving tremendous benefit.  His cardiopulmonary status has been stable  Current Medications (verified): 1)  Levothroid 50 Mcg  Tabs (Levothyroxine Sodium) .Marland Kitchen.. 1 Once Daily 2)  Eql Omeprazole 20 Mg  Tbec (Omeprazole) .Marland Kitchen.. 1 Once Daily 3)  Lorazepam 0.5 Mg  Tabs (Lorazepam) .Marland Kitchen.. 1 Two Times A Day As Needed 4)  Nitroquick 0.4 Mg  Subl (Nitroglycerin) .... Use As Dir As Needed 5)  Symbicort 80-4.5 Mcg/act  Aero (Budesonide-Formoterol Fumarate) .... Two Puffs Twice Daily As Needed 6)  Anacin 81 Mg Tbec (Aspirin) .... One Daily 7)  Warfarin Sodium 2.5 Mg Tabs (Warfarin Sodium) .... One Daily 8)  Ipratropium-Albuterol 0.5-2.5 (3) Mg/50ml Soln (Ipratropium-Albuterol) .... Use Three Times Daily As Needed 9)  Furosemide 40 Mg Tabs (Furosemide) .... One Half A Tablet in The Morning  Monday Wednesday, Friday Only 10)  Androgel Pump 1 % Gel (Testosterone) .... Use Daily 11)  Tamsulosin Hcl 0.4 Mg Caps (Tamsulosin Hcl) .... One Daily 12)  Klor-Con 10 10 Meq Cr-Tabs (Potassium Chloride) .... One Daily 13)  Coreg 3.125 Mg Tabs  (Carvedilol) .... One Half Tablet Twice Daily 14)  Tramadol Hcl 50 Mg Tabs (Tramadol Hcl) .... One Every 6 Hours As Needed For Pain 15)  Hydrocodone-Acetaminophen 5-500 Mg Tabs (Hydrocodone-Acetaminophen) .... One Every 6 Hours For Pain 16)  Ipratropium Bromide 0.02 % Soln (Ipratropium Bromide) .... Use Two Times A Day  Allergies (verified): 1)  ! Novocain (Procaine Hcl)  Past History:  Past Medical History: Reviewed history from 08/30/2009 and no changes required. Atrial fibrillation COPD Hypertension Hypothyroidism Peptic ulcer disease Peripheral vascular disease DJD Congestive heart failure Coronary artery disease left brain Cardioembolic stroke May 2010 Osteoarthritis spinal stenosis and multilevel foraminal stenosis  Past Surgical History: Reviewed history from 08/15/2009 and no changes required. Appendectomy Inguinal herniorrhaphy Tonsillectomy Abdominal aortic aneurysm repair Lumbar laminectomy  1967, 1977, 1998  Transurethral resection of prostate   Review of Systems       The patient complains of dyspnea on exertion, muscle weakness, and difficulty walking.  The patient denies anorexia, fever, weight loss, weight gain, vision loss, decreased hearing, hoarseness, chest pain, syncope, peripheral edema, prolonged cough, headaches, hemoptysis, abdominal pain, melena, hematochezia, severe indigestion/heartburn, hematuria, incontinence, genital sores, suspicious skin lesions, transient blindness, depression, unusual weight change, abnormal bleeding, enlarged lymph nodes, angioedema, breast masses, and testicular masses.    Physical Exam  General:  elderly frail, no distress; blood pressure 110/70 Head:  Normocephalic and atraumatic without  obvious abnormalities. No apparent alopecia or balding. Mouth:  Oral mucosa and oropharynx without lesions or exudates.  Teeth in good repair. Neck:  No deformities, masses, or tenderness noted. Lungs:  generally clear with diminished  breath sounds Heart:  irregular rhythm with controlled ventricular response Extremities:  no peripheral edema   Impression & Recommendations:  Problem # 1:  BENIGN PROSTATIC HYPERTROPHY, WITH URINARY OBSTRUCTION (ICD-600.01)  His updated medication list for this problem includes:    Tamsulosin Hcl 0.4 Mg Caps (Tamsulosin hcl) ..... One daily    Avodart 0.5 Mg Caps (Dutasteride) ..... One daily  His updated medication list for this problem includes:    Tamsulosin Hcl 0.4 Mg Caps (Tamsulosin hcl) ..... One daily    Avodart 0.5 Mg Caps (Dutasteride) ..... One daily  Problem # 2:  ABNORMALITY OF GAIT (ICD-781.2)  Problem # 3:  ATRIAL FIBRILLATION (ICD-427.31)  His updated medication list for this problem includes:    Anacin 81 Mg Tbec (Aspirin) ..... One daily    Warfarin Sodium 2.5 Mg Tabs (Warfarin sodium) ..... One daily    Coreg 3.125 Mg Tabs (Carvedilol) ..... One half tablet twice daily  His updated medication list for this problem includes:    Anacin 81 Mg Tbec (Aspirin) ..... One daily    Warfarin Sodium 2.5 Mg Tabs (Warfarin sodium) ..... One daily    Coreg 3.125 Mg Tabs (Carvedilol) ..... One half tablet twice daily  Complete Medication List: 1)  Levothroid 50 Mcg Tabs (Levothyroxine sodium) .Marland Kitchen.. 1 once daily 2)  Eql Omeprazole 20 Mg Tbec (Omeprazole) .Marland Kitchen.. 1 once daily 3)  Lorazepam 0.5 Mg Tabs (Lorazepam) .Marland Kitchen.. 1 two times a day as needed 4)  Nitroquick 0.4 Mg Subl (Nitroglycerin) .... Use as dir as needed 5)  Symbicort 80-4.5 Mcg/act Aero (Budesonide-formoterol fumarate) .... Two puffs twice daily as needed 6)  Anacin 81 Mg Tbec (Aspirin) .... One daily 7)  Warfarin Sodium 2.5 Mg Tabs (Warfarin sodium) .... One daily 8)  Ipratropium-albuterol 0.5-2.5 (3) Mg/17ml Soln (Ipratropium-albuterol) .... Use three times daily as needed 9)  Furosemide 40 Mg Tabs (Furosemide) .... One half a tablet in the morning  monday wednesday, friday only 10)  Tamsulosin Hcl 0.4 Mg Caps  (Tamsulosin hcl) .... One daily 11)  Klor-con 10 10 Meq Cr-tabs (Potassium chloride) .... One daily 12)  Coreg 3.125 Mg Tabs (Carvedilol) .... One half tablet twice daily 13)  Tramadol Hcl 50 Mg Tabs (Tramadol hcl) .... One every 6 hours as needed for pain 14)  Hydrocodone-acetaminophen 5-500 Mg Tabs (Hydrocodone-acetaminophen) .... One every 6 hours for pain 15)  Ipratropium Bromide 0.02 % Soln (Ipratropium bromide) .... Use two times a day 16)  Avodart 0.5 Mg Caps (Dutasteride) .... One daily  Patient Instructions: 1)  Please schedule a follow-up appointment in 2 months. 2)  Limit your Sodium (Salt). Prescriptions: AVODART 0.5 MG CAPS (DUTASTERIDE) one daily  #50 x 6   Entered and Authorized by:   Gordy Savers  MD   Signed by:   Gordy Savers  MD on 10/14/2009   Method used:   Electronically to        [facility]  [phone]* (retail)       [address] [place], [place]  [address]       Ph: [phone] or [phone]       Fax: [phone]   RxID:   [id number]   and an and a known and a  a Laboratory Results   Urine Tests   Date/Time Reported: Kathrynn Speed CMA  October 14, 2009    Routine Urinalysis   Color: yellow Appearance: Clear Glucose: negative   (Normal Range: Negative) Bilirubin: small   (Normal Range: Negative) Ketone: negative   (Normal Range: Negative) Spec. Gravity: 1.020   (Normal Range: 1.003-1.035) Blood: moderate   (Normal Range: Negative) pH: 6.0   (Normal Range: 5.0-8.0) Protein: trace   (Normal Range: Negative) Urobilinogen: 0.2   (Normal Range: 0-1) Nitrite: negative   (Normal Range: Negative) Leukocyte Esterace: negative   (Normal Range: Negative)

## 2010-05-06 NOTE — Progress Notes (Signed)
Summary: refill lorazepam  Phone Note From Pharmacy   [facility]  [phone]* Call For: k  Summary of Call: reill lorazepam 0.5mg  1 by mouth two times a day as needed Initial call taken by: Alfred Levins, CMA,  January 30, 2010 8:47 AM  Follow-up for Phone Call        #60  RF 2 Follow-up by: Gordy Savers  MD,  January 30, 2010 11:22 AM  Additional Follow-up for Phone Call Additional follow up Details #1::        Phone call completed, Pharmacist called Additional Follow-up by: Alfred Levins, CMA,  January 30, 2010 11:26 AM    Prescriptions: LORAZEPAM 0.5 MG  TABS (LORAZEPAM) 1 two times a day as needed  #50 x 2   Entered by:   Alfred Levins, CMA   Authorized by:   Gordy Savers  MD   Signed by:   Alfred Levins, CMA on 01/30/2010   Method used:   Telephoned to ...       [facility]  [phone]* (retail)       [address] [place], [place]  [address]       Ph: [phone] or [phone]       Fax: [phone]   RxID:   [id number]

## 2010-05-06 NOTE — Progress Notes (Signed)
Summary: acupuncture    Caller: Patient Call For: Corey Savers  MD Summary of Call: Pt had the epidural last week, and it helps for a few days, but is having severe pain in legs again?  What should he do?   [phone] Called back to say he's going to try acupuncture.  [name], RN  September 10, 2009 11:08 AM  Initial call taken by: [name] CMA,  September 10, 2009 8:10 AM    called and discussed

## 2010-05-06 NOTE — Assessment & Plan Note (Signed)
Summary: PAIN//CCM   Vital Signs:  Patient profile:   77 year old male Weight:      124 pounds Temp:     98.4 degrees F oral BP sitting:   148 / 90  (left arm) Cuff size:   regular  Vitals Entered By: Alfred Levins, CMA (January 31, 2010 11:09 AM) CC: bilateral leg pain   Primary Care Provider:  Gordy Savers  MD  CC:  bilateral leg pain.  History of Present Illness: an 77 year old patient who has a history of systolic heart failure and COPD.  He has had some recent surgery for skin cancer involving the bridge of his nose.  His cardiac and pulmonary status has been stable.  He has a history of severe spinal stenosis and bilateral leg pain.  This has been reasonably well controlled on narcotic analgesics  Current Medications (verified): 1)  Levothroid 50 Mcg  Tabs (Levothyroxine Sodium) .Marland Kitchen.. 1 Once Daily 2)  Eql Omeprazole 20 Mg  Tbec (Omeprazole) .Marland Kitchen.. 1 Once Daily 3)  Lorazepam 0.5 Mg  Tabs (Lorazepam) .Marland Kitchen.. 1 Two Times A Day As Needed 4)  Nitroquick 0.4 Mg  Subl (Nitroglycerin) .... Use As Dir As Needed 5)  Symbicort 80-4.5 Mcg/act  Aero (Budesonide-Formoterol Fumarate) .... Two Puffs Twice Daily As Needed 6)  Ipratropium-Albuterol 0.5-2.5 (3) Mg/10ml Soln (Ipratropium-Albuterol) .... Use Three Times Daily As Needed 7)  Furosemide 40 Mg Tabs (Furosemide) .... One Half A Tablet in The Morning  Monday Wednesday, Friday Only 8)  Tamsulosin Hcl 0.4 Mg Caps (Tamsulosin Hcl) .... One Daily 9)  Klor-Con 10 10 Meq Cr-Tabs (Potassium Chloride) .... One Daily 10)  Coreg 3.125 Mg Tabs (Carvedilol) .... One   Tablet Twice Daily 11)  Tramadol Hcl 50 Mg Tabs (Tramadol Hcl) .... One Every 6 Hours As Needed For Pain 12)  Avodart 0.5 Mg Caps (Dutasteride) .... One Daily 13)  Fentanyl 25 Mcg/hr Pt72 (Fentanyl) .... Use Every 72 Hours 14)  Plavix 75 Mg Tabs (Clopidogrel Bisulfate) .... One Daily 15)  Miralax  Powd (Polyethylene Glycol 3350) .... One Scoop in 8 Ounces of Water Once or Twice  Daily For Constipation  Allergies (verified): 1)  ! Novocain (Procaine Hcl)  Past History:  Past Medical History: Reviewed history from 08/30/2009 and no changes required. Atrial fibrillation COPD Hypertension Hypothyroidism Peptic ulcer disease Peripheral vascular disease DJD Congestive heart failure Coronary artery disease left brain Cardioembolic stroke May 2010 Osteoarthritis spinal stenosis and multilevel foraminal stenosis  Past Surgical History: Reviewed history from 08/15/2009 and no changes required. Appendectomy Inguinal herniorrhaphy Tonsillectomy Abdominal aortic aneurysm repair Lumbar laminectomy  1967, 1977, 1998  Transurethral resection of prostate   Review of Systems       The patient complains of dyspnea on exertion, muscle weakness, and difficulty walking.  The patient denies anorexia, fever, weight loss, weight gain, vision loss, decreased hearing, hoarseness, chest pain, syncope, peripheral edema, prolonged cough, headaches, hemoptysis, abdominal pain, melena, hematochezia, severe indigestion/heartburn, hematuria, incontinence, genital sores, suspicious skin lesions, transient blindness, depression, unusual weight change, abnormal bleeding, enlarged lymph nodes, angioedema, breast masses, and testicular masses.    Physical Exam  General:  underweight appearing.  alert in no distress.  BP blood pressure 120/64 Head:  Normocephalic and atraumatic without obvious abnormalities. No apparent alopecia or balding. Mouth:  Oral mucosa and oropharynx without lesions or exudates.  Teeth in good repair. Neck:  No deformities, masses, or tenderness noted. Lungs:  Normal respiratory effort, chest expands symmetrically. Lungs are clear to  auscultation, no crackles or wheezes. Heart:  controlled ventricular response Extremities:  no edema   Impression & Recommendations:  Problem # 1:  SPINAL STENOSIS (ICD-724.00)  Problem # 2:  ATRIAL FIBRILLATION  (ICD-427.31)  The following medications were removed from the medication list:    Anacin 81 Mg Tbec (Aspirin) ..... One daily His updated medication list for this problem includes:    Coreg 3.125 Mg Tabs (Carvedilol) ..... One   tablet twice daily    Plavix 75 Mg Tabs (Clopidogrel bisulfate) ..... One daily  Problem # 3:  COPD (ICD-496)  The following medications were removed from the medication list:    Ipratropium Bromide 0.02 % Soln (Ipratropium bromide) ..... Use two times a day His updated medication list for this problem includes:    Symbicort 80-4.5 Mcg/act Aero (Budesonide-formoterol fumarate) .Marland Kitchen..Marland Kitchen Two puffs twice daily as needed    Ipratropium-albuterol 0.5-2.5 (3) Mg/43ml Soln (Ipratropium-albuterol) ..... Use three times daily as needed  Complete Medication List: 1)  Levothroid 50 Mcg Tabs (Levothyroxine sodium) .Marland Kitchen.. 1 once daily 2)  Eql Omeprazole 20 Mg Tbec (Omeprazole) .Marland Kitchen.. 1 once daily 3)  Lorazepam 0.5 Mg Tabs (Lorazepam) .Marland Kitchen.. 1 two times a day as needed 4)  Nitroquick 0.4 Mg Subl (Nitroglycerin) .... Use as dir as needed 5)  Symbicort 80-4.5 Mcg/act Aero (Budesonide-formoterol fumarate) .... Two puffs twice daily as needed 6)  Ipratropium-albuterol 0.5-2.5 (3) Mg/60ml Soln (Ipratropium-albuterol) .... Use three times daily as needed 7)  Furosemide 40 Mg Tabs (Furosemide) .... One half a tablet in the morning  monday wednesday, friday only 8)  Tamsulosin Hcl 0.4 Mg Caps (Tamsulosin hcl) .... One daily 9)  Klor-con 10 10 Meq Cr-tabs (Potassium chloride) .... One daily 10)  Coreg 3.125 Mg Tabs (Carvedilol) .... One   tablet twice daily 11)  Tramadol Hcl 50 Mg Tabs (Tramadol hcl) .... One every 6 hours as needed for pain 12)  Avodart 0.5 Mg Caps (Dutasteride) .... One daily 13)  Fentanyl 25 Mcg/hr Pt72 (Fentanyl) .... Use every 72 hours 14)  Plavix 75 Mg Tabs (Clopidogrel bisulfate) .... One daily 15)  Miralax Powd (Polyethylene glycol 3350) .... One scoop in 8 ounces of  water once or twice daily for constipation  Patient Instructions: 1)  Please schedule a follow-up appointment in 3 months. 2)  Limit your Sodium (Salt). Prescriptions: FENTANYL 25 MCG/HR PT72 (FENTANYL) use every 72 hours  #12 x 0   Entered and Authorized by:   Gordy Savers  MD   Signed by:   Gordy Savers  MD on 01/31/2010   Method used:   Print then Give to Patient   RxID:   [id number]    Orders Added: 1)  Est. Patient Level III [84696]

## 2010-05-06 NOTE — Assessment & Plan Note (Signed)
Summary: leg issues//ccm   Vital Signs:  Patient profile:   77 year old male Weight:      115 pounds O2 Sat:      96 % Temp:     98.1 degrees F oral Pulse rate:   50 / minute BP sitting:   140 / 100  (right arm) Cuff size:   regular  Vitals Entered By: Kathrynn Speed CMA (November 29, 2009 11:06 AM) CC: cold leg issues, problems breathing, src Is Patient Diabetic? No   Primary Care Provider:  Gordy Savers  MD  CC:  cold leg issues, problems breathing, and src.  History of Present Illness: 77 year old patient who is seen today for follow-up.  Remains weak and unwell.  He continues to have cough, which is nonproductive.  He has had more dyspnea and has required more subtle, oxygen use, denies any wheezing.  No documented fever or chills.  Continues to have leg pain.  He has a history congestive heart failure, but has had no weight gain or peripheral edema.  Current Medications (verified): 1)  Levothroid 50 Mcg  Tabs (Levothyroxine Sodium) .Marland Kitchen.. 1 Once Daily 2)  Eql Omeprazole 20 Mg  Tbec (Omeprazole) .Marland Kitchen.. 1 Once Daily 3)  Lorazepam 0.5 Mg  Tabs (Lorazepam) .Marland Kitchen.. 1 Two Times A Day As Needed 4)  Nitroquick 0.4 Mg  Subl (Nitroglycerin) .... Use As Dir As Needed 5)  Symbicort 80-4.5 Mcg/act  Aero (Budesonide-Formoterol Fumarate) .... Two Puffs Twice Daily As Needed 6)  Anacin 81 Mg Tbec (Aspirin) .... One Daily 7)  Ipratropium-Albuterol 0.5-2.5 (3) Mg/27ml Soln (Ipratropium-Albuterol) .... Use Three Times Daily As Needed 8)  Furosemide 40 Mg Tabs (Furosemide) .... One Half A Tablet in The Morning  Monday Wednesday, Friday Only 9)  Tamsulosin Hcl 0.4 Mg Caps (Tamsulosin Hcl) .... One Daily 10)  Klor-Con 10 10 Meq Cr-Tabs (Potassium Chloride) .... One Daily 11)  Coreg 3.125 Mg Tabs (Carvedilol) .... One Half Tablet Twice Daily 12)  Tramadol Hcl 50 Mg Tabs (Tramadol Hcl) .... One Every 6 Hours As Needed For Pain 13)  Hydrocodone-Acetaminophen 5-500 Mg Tabs (Hydrocodone-Acetaminophen)  .... One Every 6 Hours For Pain 14)  Ipratropium Bromide 0.02 % Soln (Ipratropium Bromide) .... Use Two Times A Day 15)  Avodart 0.5 Mg Caps (Dutasteride) .... One Daily 16)  Fentanyl 25 Mcg/hr Pt72 (Fentanyl) .... Use Every 72 Hours 17)  Plavix 75 Mg Tabs (Clopidogrel Bisulfate) .... One Daily 18)  Miralax  Powd (Polyethylene Glycol 3350) .... One Scoop in 8 Ounces of Water Once or Twice Daily For Constipation  Allergies (verified): 1)  ! Novocain (Procaine Hcl)  Past History:  Past Medical History: Reviewed history from 08/30/2009 and no changes required. Atrial fibrillation COPD Hypertension Hypothyroidism Peptic ulcer disease Peripheral vascular disease DJD Congestive heart failure Coronary artery disease left brain Cardioembolic stroke May 2010 Osteoarthritis spinal stenosis and multilevel foraminal stenosis  Review of Systems       The patient complains of anorexia, dyspnea on exertion, and prolonged cough.  The patient denies fever, weight loss, weight gain, vision loss, decreased hearing, hoarseness, chest pain, syncope, peripheral edema, headaches, hemoptysis, abdominal pain, melena, hematochezia, severe indigestion/heartburn, hematuria, incontinence, genital sores, muscle weakness, suspicious skin lesions, transient blindness, difficulty walking, depression, unusual weight change, abnormal bleeding, enlarged lymph nodes, angioedema, breast masses, and testicular masses.    Physical Exam  General:  Well-developed,well-nourished,in no acute distress; alert,appropriate and cooperative throughout examination- elderly and frail Head:  Normocephalic and atraumatic without obvious abnormalities.  No apparent alopecia or balding. Eyes:  No corneal or conjunctival inflammation noted. EOMI. Perrla. Funduscopic exam benign, without hemorrhages, exudates or papilledema. Vision grossly normal. Ears:  External ear exam shows no significant lesions or deformities.  Otoscopic examination  reveals clear canals, tympanic membranes are intact bilaterally without bulging, retraction, inflammation or discharge. Hearing is grossly normal bilaterally. Mouth:  Oral mucosa and oropharynx without lesions or exudates.  Teeth in good repair. Neck:  No deformities, masses, or tenderness noted. Lungs:  Normal respiratory effort, chest expands symmetrically. Lungs are clear to auscultation, no crackles or wheezes.  O2 saturation 97% Heart:  Normal rate and regular rhythm. S1 and S2 normal without gallop, murmur, click, rub or other extra sounds. Abdomen:  Bowel sounds positive,abdomen soft and non-tender without masses, organomegaly or hernias noted. Extremities:  no edema   Impression & Recommendations:  Problem # 1:  DYSPNEA (ICD-786.05) may have an exacerbation of COPD.  Will continue aggressive pulmonary medications and placed on Avelox for one week  Problem # 2:  COPD (ICD-496)  His updated medication list for this problem includes:    Symbicort 80-4.5 Mcg/act Aero (Budesonide-formoterol fumarate) .Marland Kitchen..Marland Kitchen Two puffs twice daily as needed    Ipratropium-albuterol 0.5-2.5 (3) Mg/68ml Soln (Ipratropium-albuterol) ..... Use three times daily as needed    Ipratropium Bromide 0.02 % Soln (Ipratropium bromide) ..... Use two times a day  Complete Medication List: 1)  Levothroid 50 Mcg Tabs (Levothyroxine sodium) .Marland Kitchen.. 1 once daily 2)  Eql Omeprazole 20 Mg Tbec (Omeprazole) .Marland Kitchen.. 1 once daily 3)  Lorazepam 0.5 Mg Tabs (Lorazepam) .Marland Kitchen.. 1 two times a day as needed 4)  Nitroquick 0.4 Mg Subl (Nitroglycerin) .... Use as dir as needed 5)  Symbicort 80-4.5 Mcg/act Aero (Budesonide-formoterol fumarate) .... Two puffs twice daily as needed 6)  Anacin 81 Mg Tbec (Aspirin) .... One daily 7)  Ipratropium-albuterol 0.5-2.5 (3) Mg/39ml Soln (Ipratropium-albuterol) .... Use three times daily as needed 8)  Furosemide 40 Mg Tabs (Furosemide) .... One half a tablet in the morning  monday wednesday, friday  only 9)  Tamsulosin Hcl 0.4 Mg Caps (Tamsulosin hcl) .... One daily 10)  Klor-con 10 10 Meq Cr-tabs (Potassium chloride) .... One daily 11)  Coreg 3.125 Mg Tabs (Carvedilol) .... One half tablet twice daily 12)  Tramadol Hcl 50 Mg Tabs (Tramadol hcl) .... One every 6 hours as needed for pain 13)  Hydrocodone-acetaminophen 5-500 Mg Tabs (Hydrocodone-acetaminophen) .... One every 6 hours for pain 14)  Ipratropium Bromide 0.02 % Soln (Ipratropium bromide) .... Use two times a day 15)  Avodart 0.5 Mg Caps (Dutasteride) .... One daily 16)  Fentanyl 25 Mcg/hr Pt72 (Fentanyl) .... Use every 72 hours 17)  Plavix 75 Mg Tabs (Clopidogrel bisulfate) .... One daily 18)  Miralax Powd (Polyethylene glycol 3350) .... One scoop in 8 ounces of water once or twice daily for constipation  Patient Instructions: 1)  Take your antibiotic as prescribed until ALL of it is gone, but stop if you develop a rash or swelling and contact our office as soon as possible. 2)  call if there is any clinical worsening

## 2010-05-06 NOTE — Assessment & Plan Note (Signed)
Summary: HOSP FUP---NOT FEELING BETTER//CCM   Vital Signs:  Patient profile:   77 year old male Weight:      125 pounds Temp:     98 degrees F oral BP sitting:   100 / 70  (right arm) Cuff size:   regular  Vitals Entered By: Duard Brady LPN (May 28, 2009 3:02 PM) CC: f/u - still not feeling well past hospital and office visit last week Is Patient Diabetic? No  5  Primary Care Provider:  Gordy Savers  MD  CC:  f/u - still not feeling well past hospital and office visit last week.  History of Present Illness: 77 year old patient who is seen today for follow-up.  He remains weak since a recent hospital discharge.  Denies much in the way of shortness of breath.  States he wants to sleep all the time.  No chest pain, peripheral edema.  His pulmonary  status seems stable.  a recent INR was therapeutic.  He states that he has been off his Coreg for a few days.  he is still having some residual cough and chest congestion.  No fever or sputum production  Allergies: 1)  ! Novocain (Procaine Hcl)  Past History:  Past Medical History: Reviewed history from 09/14/2008 and no changes required. Atrial fibrillation COPD Hypertension Hypothyroidism Peptic ulcer disease Peripheral vascular disease DJD Congestive heart failure Coronary artery disease left brain Cardioembolic stroke May 2010  Past Surgical History: Reviewed history from 08/11/2008 and no changes required. Appendectomy Inguinal herniorrhaphy Tonsillectomy Abdominal aortic aneurysm repair Lumbar laminectomy Transurethral resection of prostate   Review of Systems       The patient complains of anorexia, muscle weakness, and difficulty walking.    Physical Exam  General:  elderly frail, alert, no distress, but appears weak; blood pressure 104/70 Head:  Normocephalic and atraumatic without obvious abnormalities. No apparent alopecia or balding. Eyes:  No corneal or conjunctival inflammation noted.  EOMI. Perrla. Funduscopic exam benign, without hemorrhages, exudates or papilledema. Vision grossly normal. Mouth:  Oral mucosa and oropharynx without lesions or exudates.  Teeth in good repair. Neck:  No deformities, masses, or tenderness noted. Lungs:  Normal respiratory effort, chest expands symmetrically. Lungs are clear to auscultation, no crackles or wheezes. O2 saturation 97% Heart:  irregular rhythm, with a rate of 92 Abdomen:  Bowel sounds positive,abdomen soft and non-tender without masses, organomegaly or hernias noted. Msk:  No deformity or scoliosis noted of thoracic or lumbar spine.   Extremities:  no edema   Impression & Recommendations:  Problem # 1:  NAUSEA (ICD-787.02)  Problem # 2:  CHRONIC OBSTRUCTIVE PULMONARY DISEASE, ACUTE EXACERBATION (ICD-491.21)  Problem # 3:  ATRIAL FIBRILLATION (ICD-427.31)  His updated medication list for this problem includes:    Anacin 81 Mg Tbec (Aspirin) ..... One daily    Warfarin Sodium 2.5 Mg Tabs (Warfarin sodium) ..... One daily  His updated medication list for this problem includes:    Anacin 81 Mg Tbec (Aspirin) ..... One daily    Warfarin Sodium 2.5 Mg Tabs (Warfarin sodium) ..... One daily  Complete Medication List: 1)  Levothroid 50 Mcg Tabs (Levothyroxine sodium) .Marland Kitchen.. 1 once daily 2)  Eql Omeprazole 20 Mg Tbec (Omeprazole) .Marland Kitchen.. 1 once daily 3)  Flomax 0.4 Mg Cp24 (Tamsulosin hcl) .Marland Kitchen.. 1 once daily 4)  Lorazepam 0.5 Mg Tabs (Lorazepam) .Marland Kitchen.. 1 two times a day as needed 5)  Nitroquick 0.4 Mg Subl (Nitroglycerin) .... Use as dir as needed 6)  Symbicort  80-4.5 Mcg/act Aero (Budesonide-formoterol fumarate) .... Two puffs twice daily as needed 7)  Carvedilol 6.25 Tabs (carvedilol)  .... One half  tablet   twice daily 8)  Anacin 81 Mg Tbec (Aspirin) .... One daily 9)  Warfarin Sodium 2.5 Mg Tabs (Warfarin sodium) .... One daily 10)  Ipratropium-albuterol 0.5-2.5 (3) Mg/94ml Soln (Ipratropium-albuterol) .... Use three times daily  as needed 11)  Furosemide 40 Mg Tabs (Furosemide) .... One half a tablet in the morning 12)  Androgel Pump 1 % Gel (Testosterone) .... Use daily 13)  Tamsulosin Hcl 0.4 Mg Caps (Tamsulosin hcl) .... One daily 14)  Klor-con 10 10 Meq Cr-tabs (Potassium chloride) .... One daily 15)  Hydrocodone-homatropine 5-1.5 Mg/54ml Syrp (Hydrocodone-homatropine) .... 0.5-teaspoon every 6 hours as needed for cough  Patient Instructions: 1)  Get plenty of rest, drink lots of clear liquids, and use Tylenol  for fever and comfort. Return in 7-10 days if you're not better:sooner if you're feeling worse. 2)  Please schedule a follow-up appointment in 1 month. Prescriptions: CARVEDILOL 6.25  TABS (CARVEDILOL) one half  tablet   twice daily  #180 x 6   Entered and Authorized by:   Gordy Savers  MD   Signed by:   Gordy Savers  MD on 05/28/2009   Method used:   Print then Give to Patient   RxID:   [id number] KLOR-CON 10 10 MEQ CR-TABS (POTASSIUM CHLORIDE) one daily  #90 x 6   Entered and Authorized by:   Gordy Savers  MD   Signed by:   Gordy Savers  MD on 05/28/2009   Method used:   Print then Give to Patient   RxID:   [id number] FUROSEMIDE 40 MG TABS (FUROSEMIDE) one half a tablet in the morning  #90 x 0   Entered and Authorized by:   Gordy Savers  MD   Signed by:   Gordy Savers  MD on 05/28/2009   Method used:   Print then Give to Patient   RxID:   [id number] WARFARIN SODIUM 2.5 MG TABS (WARFARIN SODIUM) one daily  #90 x 3   Entered and Authorized by:   Gordy Savers  MD   Signed by:   Gordy Savers  MD on 05/28/2009   Method used:   Print then Give to Patient   RxID:   [id number] LORAZEPAM 0.5 MG  TABS (LORAZEPAM) 1 two times a day as needed  #50 x 4   Entered and Authorized by:   Gordy Savers  MD   Signed by:   Gordy Savers  MD on 05/28/2009   Method used:   Print then Give to Patient   RxID:    [id number] FLOMAX 0.4 MG  CP24 (TAMSULOSIN HCL) 1 once daily  #90 x 2   Entered and Authorized by:   Gordy Savers  MD   Signed by:   Gordy Savers  MD on 05/28/2009   Method used:   Print then Give to Patient   RxID:   [id number] EQL OMEPRAZOLE 20 MG  TBEC (OMEPRAZOLE) 1 once daily  #90 Capsule x 2   Entered and Authorized by:   Gordy Savers  MD   Signed by:   Gordy Savers  MD on 05/28/2009   Method used:   Print then Give to Patient   RxID:   [id number] LEVOTHROID 50 MCG  TABS (LEVOTHYROXINE SODIUM) 1 once daily  #90 x 11  Entered and Authorized by:   Gordy Savers  MD   Signed by:   Gordy Savers  MD on 05/28/2009   Method used:   Print then Give to Patient   RxID:   [id number]

## 2010-05-06 NOTE — Assessment & Plan Note (Signed)
Summary: leg pain worse - ok per dr. Donney Dice   Primary Care Provider:  Gordy Savers  MD   History of Present Illness: patient is seen today to discuss further neck pain management.  He had a quite uncomfortable night and slept very little.  He has made no for a call to the office earlier today.  Medical regimen includes p.r.n., hydrocodone.  He has severe spinal stenosis with leg pain.  He is not a surgical candidate and has failed epidural injections.  The risk and benefit of stronger pain medications.  Discussed.  Have elected to place the patient on a fentanyl patch  Allergies: 1)  ! Novocain (Procaine Hcl)   Impression & Recommendations:  Problem # 1:  SPINAL STENOSIS (ICD-724.00)  Orders: No Charge Patient Arrived (NCPA0) (NCPA0)  Complete Medication List: 1)  Levothroid 50 Mcg Tabs (Levothyroxine sodium) .Marland Kitchen.. 1 once daily 2)  Eql Omeprazole 20 Mg Tbec (Omeprazole) .Marland Kitchen.. 1 once daily 3)  Lorazepam 0.5 Mg Tabs (Lorazepam) .Marland Kitchen.. 1 two times a day as needed 4)  Nitroquick 0.4 Mg Subl (Nitroglycerin) .... Use as dir as needed 5)  Symbicort 80-4.5 Mcg/act Aero (Budesonide-formoterol fumarate) .... Two puffs twice daily as needed 6)  Anacin 81 Mg Tbec (Aspirin) .... One daily 7)  Warfarin Sodium 2.5 Mg Tabs (Warfarin sodium) .... One daily 8)  Ipratropium-albuterol 0.5-2.5 (3) Mg/45ml Soln (Ipratropium-albuterol) .... Use three times daily as needed 9)  Furosemide 40 Mg Tabs (Furosemide) .... One half a tablet in the morning  monday wednesday, friday only 10)  Tamsulosin Hcl 0.4 Mg Caps (Tamsulosin hcl) .... One daily 11)  Klor-con 10 10 Meq Cr-tabs (Potassium chloride) .... One daily 12)  Coreg 3.125 Mg Tabs (Carvedilol) .... One half tablet twice daily 13)  Tramadol Hcl 50 Mg Tabs (Tramadol hcl) .... One every 6 hours as needed for pain 14)  Hydrocodone-acetaminophen 5-500 Mg Tabs (Hydrocodone-acetaminophen) .... One every 6 hours for pain 15)  Ipratropium Bromide 0.02 %  Soln (Ipratropium bromide) .... Use two times a day 16)  Avodart 0.5 Mg Caps (Dutasteride) .... One daily 17)  Fentanyl 12 Mcg/hr Pt72 (Fentanyl) .... Use every 72 hours  Patient Instructions: 1)  Please schedule a follow-up appointment in 1 month. Prescriptions: FENTANYL 12 MCG/HR PT72 (FENTANYL) use every 72 hours  #12 x 0   Entered and Authorized by:   Gordy Savers  MD   Signed by:   Gordy Savers  MD on 10/25/2009   Method used:   Print then Give to Patient   RxID:   [id number]   Appended Document: leg pain worse - ok per dr. Donney Dice Pt is still hurting, and patch is not helping.  Wonders if he has varicose veins.?? [id number]

## 2010-05-06 NOTE — Miscellaneous (Signed)
Summary: Physician's Orders/[facility]  Physician's Orders/[facility]   Imported By: Maryln Gottron 10/10/2009 13:32:06  _____________________________________________________________________  External Attachment:    Type:   Image     Comment:   External Document

## 2010-05-06 NOTE — Assessment & Plan Note (Signed)
Summary: sore is not healing/cjr   Vital Signs:  Patient profile:   77 year old male BP sitting:   110 / 70  (left arm) Cuff size:   regular  Vitals Entered By: Duard Brady LPN (February 20, 2010 4:14 PM) CC: concerned about (L) hand abrasion Is Patient Diabetic? No   Primary Care Provider:  Gordy Savers  MD  CC:  concerned about (L) hand abrasion.  History of Present Illness: an 77 year old patient who is seen today for follow-up of a wound involving the dorsum of his left hand.  He experienced significant soft tissue trauma after a recent fall.  There is been some minimal pain, but no drainage, excessive warmth, or signs of wound infection.  Denies any fever or chills  Allergies: 1)  ! Novocain (Procaine Hcl)  Past History:  Past Medical History: Reviewed history from 08/30/2009 and no changes required. Atrial fibrillation COPD Hypertension Hypothyroidism Peptic ulcer disease Peripheral vascular disease DJD Congestive heart failure Coronary artery disease left brain Cardioembolic stroke May 2010 Osteoarthritis spinal stenosis and multilevel foraminal stenosis  Physical Exam  General:  Well-developed,well-nourished,in no acute distress; alert,appropriate and cooperative throughout examination Skin:  4 x 5 cm open wound involving the dorsum of the left hand; the dead skin was debrided and the area cleaned.  Antibiotic ointment applied and wrapped   Impression & Recommendations:  Problem # 1:  OPEN WOUND UNSPEC SITE WITHOUT MENTION COMP (ICD-879.8)  Problem # 2:  CONTUSIONS, MULTIPLE (ICD-924.8)  Complete Medication List: 1)  Levothroid 50 Mcg Tabs (Levothyroxine sodium) .Marland Kitchen.. 1 once daily 2)  Eql Omeprazole 20 Mg Tbec (Omeprazole) .Marland Kitchen.. 1 once daily 3)  Lorazepam 0.5 Mg Tabs (Lorazepam) .Marland Kitchen.. 1 two times a day as needed 4)  Nitroquick 0.4 Mg Subl (Nitroglycerin) .... Use as dir as needed 5)  Symbicort 80-4.5 Mcg/act Aero (Budesonide-formoterol  fumarate) .... Two puffs twice daily as needed 6)  Ipratropium-albuterol 0.5-2.5 (3) Mg/62ml Soln (Ipratropium-albuterol) .... Use three times daily as needed 7)  Furosemide 40 Mg Tabs (Furosemide) .... One half a tablet in the morning  monday wednesday, friday only 8)  Tamsulosin Hcl 0.4 Mg Caps (Tamsulosin hcl) .... One daily 9)  Klor-con 10 10 Meq Cr-tabs (Potassium chloride) .... One daily 10)  Coreg 3.125 Mg Tabs (Carvedilol) .... One   tablet twice daily 11)  Tramadol Hcl 50 Mg Tabs (Tramadol hcl) .... One every 6 hours as needed for pain 12)  Avodart 0.5 Mg Caps (Dutasteride) .... One daily 13)  Fentanyl 25 Mcg/hr Pt72 (Fentanyl) .... Use every 72 hours 14)  Plavix 75 Mg Tabs (Clopidogrel bisulfate) .... One daily 15)  Miralax Powd (Polyethylene glycol 3350) .... One scoop in 8 ounces of water once or twice daily for constipation 16)  Ciprofloxacin Hcl 500 Mg Tabs (Ciprofloxacin hcl) .... One twice daily  Patient Instructions: 1)  soak the left hand in warm tap water for 15 minutes daily, gently clean with soap and water and wrap 2)  Please schedule a follow-up appointment as needed.   Orders Added: 1)  Est. Patient Level III [16109]

## 2010-05-06 NOTE — Miscellaneous (Signed)
Summary: Physician's Orders/[facility]  Physician's Orders/[facility]   Imported By: Maryln Gottron 10/15/2009 13:36:30  _____________________________________________________________________  External Attachment:    Type:   Image     Comment:   External Document

## 2010-05-06 NOTE — Assessment & Plan Note (Signed)
Summary: pt not feeling well/njr   Vital Signs:  Patient profile:   77 year old male Weight:      133 pounds Temp:     98.1 degrees F oral BP sitting:   150 / 90  (left arm) Cuff size:   regular  Vitals Entered By: Duard Brady LPN (May 16, 2009 1:09 PM) CC: c/o weakness , nausea , unstable with ambulation   Primary Care Provider:  Gordy Savers  MD  CC:  c/o weakness , nausea , and unstable with ambulation.  History of Present Illness: 77 -year-old patient who has a history of COPD, as well as chronic systolic heart failure. He has a history of permanent atrial relation and has been on chronic Coumadin.  For the past couple days.  She has had some increasing shortness of breath and mild nonproductive cough.  There is been no wheezing, fever, or sputum production.  Today, he awoke feeling quite weak.  He has not been taking potassium supplementation  Allergies: 1)  ! Novocain (Procaine Hcl)  Past History:  Past Medical History: Reviewed history from 09/14/2008 and no changes required. Atrial fibrillation COPD Hypertension Hypothyroidism Peptic ulcer disease Peripheral vascular disease DJD Congestive heart failure Coronary artery disease left brain Cardioembolic stroke May 2010  Review of Systems       The patient complains of anorexia, dyspnea on exertion, prolonged cough, and muscle weakness.  The patient denies fever, weight loss, weight gain, vision loss, decreased hearing, hoarseness, chest pain, syncope, peripheral edema, headaches, hemoptysis, abdominal pain, melena, hematochezia, severe indigestion/heartburn, hematuria, incontinence, genital sores, suspicious skin lesions, transient blindness, difficulty walking, depression, unusual weight change, abnormal bleeding, enlarged lymph nodes, angioedema, breast masses, and testicular masses.    Physical Exam  General:  elderly frail, no acute distress at rest.  Blood pressure 130/80.  Pulse rate  120 Head:  Normocephalic and atraumatic without obvious abnormalities. No apparent alopecia or balding. Eyes:  No corneal or conjunctival inflammation noted. EOMI. Perrla. Funduscopic exam benign, without hemorrhages, exudates or papilledema. Vision grossly normal. Mouth:  Oral mucosa and oropharynx without lesions or exudates.  Teeth in good repair. Neck:  No deformities, masses, or tenderness noted. Lungs:  Normal respiratory effort, chest expands symmetrically. Lungs are clear to auscultation, no crackles or wheezes. O2 saturation 94% Heart:  irregular rhythm, rate 120 Abdomen:  Bowel sounds positive,abdomen soft and non-tender without masses, organomegaly or hernias noted. Msk:  No deformity or scoliosis noted of thoracic or lumbar spine.   Extremities:  no pedal edema   Impression & Recommendations:  Problem # 1:  CHRONIC OBSTRUCTIVE PULMONARY DISEASE, ACUTE EXACERBATION (ICD-491.21) may have viral illness;  will continue aggressive pulmonary toilet including home nebulizer treatments  Problem # 2:  COUMADIN THERAPY (ICD-V58.61)  will check INR  Orders: TLB-BMP (Basic Metabolic Panel-BMET) (80048-METABOL) TLB-CBC Platelet - w/Differential (85025-CBCD)  Problem # 3:  ATRIAL FIBRILLATION (ICD-427.31)  His updated medication list for this problem includes:    Anacin 81 Mg Tbec (Aspirin) ..... One daily    Warfarin Sodium 2.5 Mg Tabs (Warfarin sodium) ..... One daily uncontrolled ventricular response may be a factor with his dyspnea and weakness.  Will increase his core 8 to 12.5 mg b.i.d.  Will recheck in 5 days  Orders: TLB-BMP (Basic Metabolic Panel-BMET) (80048-METABOL) TLB-CBC Platelet - w/Differential (85025-CBCD)  Complete Medication List: 1)  Levothroid 50 Mcg Tabs (Levothyroxine sodium) .Marland Kitchen.. 1 once daily 2)  Eql Omeprazole 20 Mg Tbec (Omeprazole) .Marland Kitchen.. 1 once daily  3)  Flomax 0.4 Mg Cp24 (Tamsulosin hcl) .Marland Kitchen.. 1 once daily 4)  Lorazepam 0.5 Mg Tabs (Lorazepam) .Marland Kitchen.. 1  two times a day as needed 5)  Nitroquick 0.4 Mg Subl (Nitroglycerin) .... Use as dir as needed 6)  Symbicort 80-4.5 Mcg/act Aero (Budesonide-formoterol fumarate) .... Two puffs twice daily as needed 7)  Carvedilol 6.25 Tabs (carvedilol)  .... One tablet   twice daily 8)  Anacin 81 Mg Tbec (Aspirin) .... One daily 9)  Warfarin Sodium 2.5 Mg Tabs (Warfarin sodium) .... One daily 10)  Ipratropium-albuterol 0.5-2.5 (3) Mg/91ml Soln (Ipratropium-albuterol) .... Use three times daily as needed 11)  Furosemide 40 Mg Tabs (Furosemide) .... One half a tablet in the morning 12)  Androgel Pump 1 % Gel (Testosterone) .... Use daily 13)  Tamsulosin Hcl 0.4 Mg Caps (Tamsulosin hcl) .... One daily 14)  Klor-con 10 10 Meq Cr-tabs (Potassium chloride) .... One daily  Patient Instructions: 1)  return in 5 days for follow-up 2)  increase carvedilol to two tablets twice daily 3)  Call or report to the emergency department if worsening

## 2010-05-06 NOTE — Miscellaneous (Signed)
Summary: Plan of Care & Treatment/[facility]  Plan of Care & Treatment/[facility]   Imported By: Sherian Rein 06/10/2009 13:07:54  _____________________________________________________________________  External Attachment:    Type:   Image     Comment:   External Document

## 2010-05-06 NOTE — Assessment & Plan Note (Signed)
Summary: PT/RCD   Nurse Visit   Allergies: 1)  ! Novocain (Procaine Hcl) Laboratory Results   Blood Tests      INR: 1.5   (Normal Range: 0.88-1.12   Therap INR: 2.0-3.5) Comments: [name]  Sep 03, 2009 10:43 AM     Orders Added: 1)  Est. Patient Level I [99211] 2)  Protime [[id number]]    ANTICOAGULATION RECORD PREVIOUS REGIMEN & LAB RESULTS Anticoagulation Diagnosis:  V58.83,V58.61,427.31 on  08/09/2009 Previous INR Goal Range:  2.0-3.0 on  08/09/2009 Previous INR:  1.5 on  08/09/2009 Previous Coumadin Dose(mg):  5mg  only on wed, then 2.5mg  x 6 days  on  08/09/2009 Previous Regimen:  5mg  on wed,sun then 2.5mg  on other days on  08/09/2009  NEW REGIMEN & LAB RESULTS Current INR: 1.5 Regimen: same  Repeat testing in: told patient to ask physician doing procedure  Anticoagulation Visit Questionnaire Coumadin dose missed/changed:  Yes Coumadin Dose Comments:  one or more missed dose(s) Abnormal Bleeding Symptoms:  No  Any diet changes including alcohol intake, vegetables or greens since the last visit:  No Any illnesses or hospitalizations since the last visit:  No Any signs of clotting since the last visit (including chest discomfort, dizziness, shortness of breath, arm tingling, slurred speech, swelling or redness in leg):  No  MEDICATIONS LEVOTHROID 50 MCG  TABS (LEVOTHYROXINE SODIUM) 1 once daily EQL OMEPRAZOLE 20 MG  TBEC (OMEPRAZOLE) 1 once daily FLOMAX 0.4 MG  CP24 (TAMSULOSIN HCL) 1 once daily LORAZEPAM 0.5 MG  TABS (LORAZEPAM) 1 two times a day as needed NITROQUICK 0.4 MG  SUBL (NITROGLYCERIN) use as dir as needed SYMBICORT 80-4.5 MCG/ACT  AERO (BUDESONIDE-FORMOTEROL FUMARATE) two puffs twice daily as needed ANACIN 81 MG TBEC (ASPIRIN) one daily WARFARIN SODIUM 2.5 MG TABS (WARFARIN SODIUM) one daily IPRATROPIUM-ALBUTEROL 0.5-2.5 (3) MG/3ML SOLN (IPRATROPIUM-ALBUTEROL) Use three times daily as needed FUROSEMIDE 40 MG TABS (FUROSEMIDE) one half a  tablet in the morning  Monday Wednesday, Friday only ANDROGEL PUMP 1 % GEL (TESTOSTERONE) use daily TAMSULOSIN HCL 0.4 MG CAPS (TAMSULOSIN HCL) one daily KLOR-CON 10 10 MEQ CR-TABS (POTASSIUM CHLORIDE) one daily COREG 3.125 MG TABS (CARVEDILOL) one half tablet twice daily TRAMADOL HCL 50 MG TABS (TRAMADOL HCL) one every 6 hours as needed for pain HYDROCODONE-ACETAMINOPHEN 5-500 MG TABS (HYDROCODONE-ACETAMINOPHEN) one every 6 hours for pain

## 2010-05-06 NOTE — Assessment & Plan Note (Signed)
Summary: PT/RCD   Nurse Visit   Allergies: 1)  ! Novocain (Procaine Hcl) Laboratory Results   Blood Tests      INR: 2.2   (Normal Range: 0.88-1.12   Therap INR: 2.0-3.5) Comments: [name]  November 13, 2009 10:21 AM     Orders Added: 1)  Est. Patient Level I [99211] 2)  Protime [[id number]]   ANTICOAGULATION RECORD PREVIOUS REGIMEN & LAB RESULTS Anticoagulation Diagnosis:  V58.83,V58.61,427.31 on  08/09/2009 Previous INR Goal Range:  2.0-3.0 on  08/09/2009 Previous INR:  1.9 on  11/06/2009 Previous Coumadin Dose(mg):  5mg  only on wed, then 2.5mg  x 6 days  on  08/09/2009 Previous Regimen:  same on  11/06/2009  NEW REGIMEN & LAB RESULTS Current INR: 2.2 Regimen: same  Repeat testing in: 4 weeks  Anticoagulation Visit Questionnaire Coumadin dose missed/changed:  No Abnormal Bleeding Symptoms:  No  Any diet changes including alcohol intake, vegetables or greens since the last visit:  No Any illnesses or hospitalizations since the last visit:  No Any signs of clotting since the last visit (including chest discomfort, dizziness, shortness of breath, arm tingling, slurred speech, swelling or redness in leg):  No  MEDICATIONS LEVOTHROID 50 MCG  TABS (LEVOTHYROXINE SODIUM) 1 once daily EQL OMEPRAZOLE 20 MG  TBEC (OMEPRAZOLE) 1 once daily LORAZEPAM 0.5 MG  TABS (LORAZEPAM) 1 two times a day as needed NITROQUICK 0.4 MG  SUBL (NITROGLYCERIN) use as dir as needed SYMBICORT 80-4.5 MCG/ACT  AERO (BUDESONIDE-FORMOTEROL FUMARATE) two puffs twice daily as needed ANACIN 81 MG TBEC (ASPIRIN) one daily WARFARIN SODIUM 2.5 MG TABS (WARFARIN SODIUM) one daily IPRATROPIUM-ALBUTEROL 0.5-2.5 (3) MG/3ML SOLN (IPRATROPIUM-ALBUTEROL) Use three times daily as needed FUROSEMIDE 40 MG TABS (FUROSEMIDE) one half a tablet in the morning  Monday Wednesday, Friday only TAMSULOSIN HCL 0.4 MG CAPS (TAMSULOSIN HCL) one daily KLOR-CON 10 10 MEQ CR-TABS (POTASSIUM CHLORIDE) one daily COREG 3.125  MG TABS (CARVEDILOL) one half tablet twice daily TRAMADOL HCL 50 MG TABS (TRAMADOL HCL) one every 6 hours as needed for pain HYDROCODONE-ACETAMINOPHEN 5-500 MG TABS (HYDROCODONE-ACETAMINOPHEN) one every 6 hours for pain IPRATROPIUM BROMIDE 0.02 % SOLN (IPRATROPIUM BROMIDE) Use two times a day AVODART 0.5 MG CAPS (DUTASTERIDE) one daily FENTANYL 25 MCG/HR PT72 (FENTANYL) use every 72 hours

## 2010-05-06 NOTE — Miscellaneous (Signed)
Summary: Interventional radiology for Epidural   Clinical Lists Changes  Problems: Added new problem of SPINAL STENOSIS (ICD-724.00) Orders: Added new Referral order of Radiology Referral (Radiology) - Signed

## 2010-05-06 NOTE — Consult Note (Signed)
Summary: [facility]  [facility]   Imported By: Maryln Gottron 03/06/2010 15:02:45  _____________________________________________________________________  External Attachment:    Type:   Image     Comment:   External Document

## 2010-05-06 NOTE — Assessment & Plan Note (Signed)
Summary: PT/NS   Nurse Visit   Allergies: 1)  ! Novocain (Procaine Hcl) Laboratory Results   Blood Tests      INR: 1.9   (Normal Range: 0.88-1.12   Therap INR: 2.0-3.5) Comments: [name]  September 27, 2009 10:43 AM     Orders Added: 1)  Est. Patient Level I [99211] 2)  Protime [95621HY]   ANTICOAGULATION RECORD PREVIOUS REGIMEN & LAB RESULTS Anticoagulation Diagnosis:  V58.83,V58.61,427.31 on  08/09/2009 Previous INR Goal Range:  2.0-3.0 on  08/09/2009 Previous INR:  1.5 on  09/03/2009 Previous Coumadin Dose(mg):  5mg  only on wed, then 2.5mg  x 6 days  on  08/09/2009 Previous Regimen:  same on  09/03/2009  NEW REGIMEN & LAB RESULTS Current INR: 1.9 Regimen: same  Repeat testing in: 4 weeks  Anticoagulation Visit Questionnaire Coumadin dose missed/changed:  No Abnormal Bleeding Symptoms:  No  Any diet changes including alcohol intake, vegetables or greens since the last visit:  No Any illnesses or hospitalizations since the last visit:  Yes Any signs of clotting since the last visit (including chest discomfort, dizziness, shortness of breath, arm tingling, slurred speech, swelling or redness in leg):  No  MEDICATIONS LEVOTHROID 50 MCG  TABS (LEVOTHYROXINE SODIUM) 1 once daily EQL OMEPRAZOLE 20 MG  TBEC (OMEPRAZOLE) 1 once daily LORAZEPAM 0.5 MG  TABS (LORAZEPAM) 1 two times a day as needed NITROQUICK 0.4 MG  SUBL (NITROGLYCERIN) use as dir as needed SYMBICORT 80-4.5 MCG/ACT  AERO (BUDESONIDE-FORMOTEROL FUMARATE) two puffs twice daily as needed ANACIN 81 MG TBEC (ASPIRIN) one daily WARFARIN SODIUM 2.5 MG TABS (WARFARIN SODIUM) one daily IPRATROPIUM-ALBUTEROL 0.5-2.5 (3) MG/3ML SOLN (IPRATROPIUM-ALBUTEROL) Use three times daily as needed FUROSEMIDE 40 MG TABS (FUROSEMIDE) one half a tablet in the morning  Monday Wednesday, Friday only ANDROGEL PUMP 1 % GEL (TESTOSTERONE) use daily TAMSULOSIN HCL 0.4 MG CAPS (TAMSULOSIN HCL) one daily KLOR-CON 10 10 MEQ  CR-TABS (POTASSIUM CHLORIDE) one daily COREG 3.125 MG TABS (CARVEDILOL) one half tablet twice daily TRAMADOL HCL 50 MG TABS (TRAMADOL HCL) one every 6 hours as needed for pain HYDROCODONE-ACETAMINOPHEN 5-500 MG TABS (HYDROCODONE-ACETAMINOPHEN) one every 6 hours for pain

## 2010-05-06 NOTE — Assessment & Plan Note (Signed)
Summary: leg pain//ccm   Vital Signs:  Patient profile:   77 year old male Height:      68 inches Weight:      123 pounds BMI:     18.77 Temp:     98.2 degrees F oral Pulse rate:   60 / minute Resp:     14 per minute BP sitting:   130 / 80  (left arm)  Vitals Entered By: Willy Eddy, LPN (January 23, 2010 11:09 AM) CC: c/o bilateral leg pain/ Is Patient Diabetic? No   Primary Care Provider:  Gordy Savers  MD  CC:  c/o bilateral leg pain/.  History of Present Illness: 77 -year-old patient who has a history of chronic atrial fibrillation.  He awoke at 5 a.m. with significant back and leg pain.  He has a history of severe spinal stenosis and leg pain.  He remains on a fentanyl patch, but he neglected to change this on scheduled the day prior.  He has changed.  The patch and presently is back to baseline. He has COPD and heart failure, which have been stable.  He denies any chronic complaints today  Preventive Screening-Counseling & Management  Alcohol-Tobacco     Smoking Status: never  Current Problems (verified): 1)  Coumadin Therapy  (ICD-V58.61) 2)  Encounter For Therapeutic Drug Monitoring  (ICD-V58.83) 3)  Hematuria Unspecified  (ICD-599.70) 4)  Benign Prostatic Hypertrophy, With Urinary Obstruction  (ICD-600.01) 5)  Enuresis  (ICD-307.6) 6)  Spinal Stenosis  (ICD-724.00) 7)  Osteoarthritis  (ICD-715.90) 8)  Encounter For Therapeutic Drug Monitoring  (ICD-V58.83) 9)  Leg Pain, Bilateral  (ICD-729.5) 10)  Preventive Health Care  (ICD-V70.0) 11)  Urinary Urgency  (ICD-788.63) 12)  Abdominal Pain  (ICD-789.00) 13)  Orthostatic Dizziness  (ICD-780.4) 14)  Nausea  (ICD-787.02) 15)  Acquired Deformity of Chest and Rib  (ICD-738.3) 16)  Elbow Pain, Left  (ICD-719.42) 17)  Headache  (ICD-784.0) 18)  Abnormality of Gait  (ICD-781.2) 19)  Coumadin Therapy  (ICD-V58.61) 20)  Stroke  (ICD-434.91) 21)  Dyspnea  (ICD-786.05) 22)  H/F Other Specified Intestinal  Obstruction  (ICD-560.89) 23)  Elbow Pain, Left  (ICD-719.42) 24)  Uri  (ICD-465.9) 25)  Lassitude  (ICD-780.79) 26)  Gerd  (ICD-530.81) 27)  Chronic Obstructive Pulmonary Disease, Acute Exacerbation  (ICD-491.21) 28)  Back Pain  (ICD-724.5) 29)  Dysuria  (ICD-788.1) 30)  Coronary Artery Disease  (ICD-414.00) 31)  Chronic Systolic Heart Failure  (ICD-428.22) 32)  Degenerative Joint Disease  (ICD-715.90) 33)  Peripheral Vascular Disease  (ICD-443.9) 34)  Peptic Ulcer Disease  (ICD-533.90) 35)  Hypothyroidism  (ICD-244.9) 36)  Hypertension  (ICD-401.9) 37)  COPD  (ICD-496) 38)  Atrial Fibrillation  (ICD-427.31)  Current Medications (verified): 1)  Levothroid 50 Mcg  Tabs (Levothyroxine Sodium) .Marland Kitchen.. 1 Once Daily 2)  Eql Omeprazole 20 Mg  Tbec (Omeprazole) .Marland Kitchen.. 1 Once Daily 3)  Lorazepam 0.5 Mg  Tabs (Lorazepam) .Marland Kitchen.. 1 Two Times A Day As Needed 4)  Nitroquick 0.4 Mg  Subl (Nitroglycerin) .... Use As Dir As Needed 5)  Symbicort 80-4.5 Mcg/act  Aero (Budesonide-Formoterol Fumarate) .... Two Puffs Twice Daily As Needed 6)  Anacin 81 Mg Tbec (Aspirin) .... One Daily 7)  Ipratropium-Albuterol 0.5-2.5 (3) Mg/66ml Soln (Ipratropium-Albuterol) .... Use Three Times Daily As Needed 8)  Furosemide 40 Mg Tabs (Furosemide) .... One Half A Tablet in The Morning  Monday Wednesday, Friday Only 9)  Tamsulosin Hcl 0.4 Mg Caps (Tamsulosin Hcl) .... One Daily 10)  Klor-Con 10 10 Meq Cr-Tabs (Potassium Chloride) .... One Daily 11)  Coreg 3.125 Mg Tabs (Carvedilol) .... One Half Tablet Twice Daily 12)  Tramadol Hcl 50 Mg Tabs (Tramadol Hcl) .... One Every 6 Hours As Needed For Pain 13)  Hydrocodone-Acetaminophen 5-500 Mg Tabs (Hydrocodone-Acetaminophen) .... One Every 6 Hours For Pain 14)  Ipratropium Bromide 0.02 % Soln (Ipratropium Bromide) .... Use Two Times A Day 15)  Avodart 0.5 Mg Caps (Dutasteride) .... One Daily 16)  Fentanyl 25 Mcg/hr Pt72 (Fentanyl) .... Use Every 72 Hours 17)  Plavix 75 Mg Tabs  (Clopidogrel Bisulfate) .... One Daily 18)  Miralax  Powd (Polyethylene Glycol 3350) .... One Scoop in 8 Ounces of Water Once or Twice Daily For Constipation  Allergies (verified): 1)  ! Novocain (Procaine Hcl)  Past History:  Past Medical History: Reviewed history from 08/30/2009 and no changes required. Atrial fibrillation COPD Hypertension Hypothyroidism Peptic ulcer disease Peripheral vascular disease DJD Congestive heart failure Coronary artery disease left brain Cardioembolic stroke May 2010 Osteoarthritis spinal stenosis and multilevel foraminal stenosis  Review of Systems       The patient complains of muscle weakness and difficulty walking.  The patient denies anorexia, fever, weight loss, weight gain, vision loss, decreased hearing, hoarseness, chest pain, syncope, dyspnea on exertion, peripheral edema, prolonged cough, headaches, hemoptysis, abdominal pain, melena, hematochezia, severe indigestion/heartburn, hematuria, incontinence, genital sores, suspicious skin lesions, transient blindness, depression, unusual weight change, abnormal bleeding, enlarged lymph nodes, angioedema, breast masses, and testicular masses.    Physical Exam  General:  underweight appearing.  120/70 Head:  Normocephalic and atraumatic without obvious abnormalities. No apparent alopecia or balding. Mouth:  Oral mucosa and oropharynx without lesions or exudates.  Teeth in good repair. Neck:  No deformities, masses, or tenderness noted. Lungs:  diminished breath sounds, but clearnormal respiratory effort, no intercostal retractions, and no accessory muscle use.   Heart:  irregular rhythm, with a rate of 90 to 95 Abdomen:  Bowel sounds positive,abdomen soft and non-tender without masses, organomegaly or hernias noted. Msk:  No deformity or scoliosis noted of thoracic or lumbar spine.   Extremities:  no edema   Impression & Recommendations:  Problem # 1:  CHRONIC SYSTOLIC HEART FAILURE  (ICD-428.22)  His updated medication list for this problem includes:    Anacin 81 Mg Tbec (Aspirin) ..... One daily    Furosemide 40 Mg Tabs (Furosemide) ..... One half a tablet in the morning  monday wednesday, friday only    Coreg 3.125 Mg Tabs (Carvedilol) ..... One   tablet twice daily    Plavix 75 Mg Tabs (Clopidogrel bisulfate) ..... One daily  Problem # 2:  ATRIAL FIBRILLATION (ICD-427.31)  His updated medication list for this problem includes:    Anacin 81 Mg Tbec (Aspirin) ..... One daily    Coreg 3.125 Mg Tabs (Carvedilol) ..... One   tablet twice daily    Plavix 75 Mg Tabs (Clopidogrel bisulfate) ..... One daily  Complete Medication List: 1)  Levothroid 50 Mcg Tabs (Levothyroxine sodium) .Marland Kitchen.. 1 once daily 2)  Eql Omeprazole 20 Mg Tbec (Omeprazole) .Marland Kitchen.. 1 once daily 3)  Lorazepam 0.5 Mg Tabs (Lorazepam) .Marland Kitchen.. 1 two times a day as needed 4)  Nitroquick 0.4 Mg Subl (Nitroglycerin) .... Use as dir as needed 5)  Symbicort 80-4.5 Mcg/act Aero (Budesonide-formoterol fumarate) .... Two puffs twice daily as needed 6)  Anacin 81 Mg Tbec (Aspirin) .... One daily 7)  Ipratropium-albuterol 0.5-2.5 (3) Mg/72ml Soln (Ipratropium-albuterol) .... Use three times daily  as needed 8)  Furosemide 40 Mg Tabs (Furosemide) .... One half a tablet in the morning  monday wednesday, friday only 9)  Tamsulosin Hcl 0.4 Mg Caps (Tamsulosin hcl) .... One daily 10)  Klor-con 10 10 Meq Cr-tabs (Potassium chloride) .... One daily 11)  Coreg 3.125 Mg Tabs (Carvedilol) .... One   tablet twice daily 12)  Tramadol Hcl 50 Mg Tabs (Tramadol hcl) .... One every 6 hours as needed for pain 13)  Hydrocodone-acetaminophen 5-500 Mg Tabs (Hydrocodone-acetaminophen) .... One every 6 hours for pain 14)  Ipratropium Bromide 0.02 % Soln (Ipratropium bromide) .... Use two times a day 15)  Avodart 0.5 Mg Caps (Dutasteride) .... One daily 16)  Fentanyl 25 Mcg/hr Pt72 (Fentanyl) .... Use every 72 hours 17)  Plavix 75 Mg Tabs  (Clopidogrel bisulfate) .... One daily 18)  Miralax Powd (Polyethylene glycol 3350) .... One scoop in 8 ounces of water once or twice daily for constipation  Patient Instructions: 1)  Please schedule a follow-up appointment in 2 months. 2)  Limit your Sodium (Salt). 3)  Increase  COREG  to one tablet twice daily  Prescriptions: PLAVIX 75 MG TABS (CLOPIDOGREL BISULFATE) one daily  #30 x 3   Entered and Authorized by:   Gordy Savers  MD   Signed by:   Gordy Savers  MD on 01/23/2010   Method used:   Electronically to        [facility]  [phone]* (retail)       [address] [place], [place]  [address]       Ph: [phone] or [phone]       Fax: [phone]   RxID:   [id number] TRAMADOL HCL 50 MG TABS (TRAMADOL HCL) one every 6 hours as needed for pain  #90 x 6   Entered and Authorized by:   Gordy Savers  MD   Signed by:   Gordy Savers  MD on 01/23/2010   Method used:   Electronically to        [facility]  [phone]* (retail)       [address] [place], [place]  [address]       Ph: [phone] or [phone]       Fax: [phone]   RxID:   [id number] COREG 3.125 MG TABS (CARVEDILOL) one   tablet twice daily  #180 x 4   Entered and Authorized by:   Gordy Savers  MD   Signed by:   Gordy Savers  MD on 01/23/2010   Method used:   Electronically to        [facility]  [phone]* (retail)       [address] [place], [place]  [address]       Ph: [phone] or [phone]       Fax: [phone]   RxID:   [id number] KLOR-CON 10 10 MEQ CR-TABS (POTASSIUM CHLORIDE) one daily  #90.0 Tablet x 1   Entered and Authorized by:   Gordy Savers  MD   Signed by:   Gordy Savers  MD on 01/23/2010   Method used:   Electronically to        [facility]  [phone]* (retail)       [address] [place], [place]  [address]  Ph: [phone] or [phone]       Fax:  [phone]   RxID:   [id number] TAMSULOSIN HCL 0.4 MG CAPS (TAMSULOSIN HCL) one daily  #90 Capsule x 2   Entered and Authorized by:   Gordy Savers  MD   Signed by:   Gordy Savers  MD on 01/23/2010   Method used:   Electronically to        [facility]  [phone]* (retail)       [address] [place], [place]  [address]       Ph: [phone] or [phone]       Fax: [phone]   RxID:   [id number] IPRATROPIUM-ALBUTEROL 0.5-2.5 (3) MG/3ML SOLN (IPRATROPIUM-ALBUTEROL) Use three times daily as needed  #62.5 [place] x 5   Entered and Authorized by:   Gordy Savers  MD   Signed by:   Gordy Savers  MD on 01/23/2010   Method used:   Electronically to        [facility]  [phone]* (retail)       [address] [place], [place]  [address]       Ph: [phone] or [phone]       Fax: [phone]   RxID:   [id number] EQL OMEPRAZOLE 20 MG  TBEC (OMEPRAZOLE) 1 once daily  #90 Capsule x 2   Entered and Authorized by:   Gordy Savers  MD   Signed by:   Gordy Savers  MD on 01/23/2010   Method used:   Electronically to        [facility]  [phone]* (retail)       [address] [place], [place]  [address]       Ph: [phone] or [phone]       Fax: [phone]   RxID:   [id number] LEVOTHROID 50 MCG  TABS (LEVOTHYROXINE SODIUM) 1 once daily  #90 Tablet x 1   Entered and Authorized by:   Gordy Savers  MD   Signed by:   Gordy Savers  MD on 01/23/2010   Method used:   Electronically to        [facility]  [phone]* (retail)       [address] [place], [place]  [address]       Ph: [phone] or [phone]       Fax: [phone]   RxID:   [id number]    Orders Added: 1)  Est. Patient Level III [29798]

## 2010-05-06 NOTE — Medication Information (Signed)
Summary: Order for LSO Back Support  Order for LSO Back Support   Imported By: Maryln Gottron 09/17/2009 14:56:38  _____________________________________________________________________  External Attachment:    Type:   Image     Comment:   External Document

## 2010-05-06 NOTE — Miscellaneous (Signed)
Summary: Certification and Plan of Care/[facility]  Certification and Plan of Care/[facility]   Imported By: Maryln Gottron 10/15/2009 13:35:10  _____________________________________________________________________  External Attachment:    Type:   Image     Comment:   External Document

## 2010-05-06 NOTE — Assessment & Plan Note (Signed)
Summary: L LEG PAIN / BLADDER INF? // RS   Vital Signs:  Patient profile:   77 year old male Weight:      125 pounds Temp:     97.8 degrees F oral  Vitals Entered By: Duard Brady LPN (Aug 30, 2009 12:57 PM) CC: c/o leg pain - having epidural block on tuesday , also c/o burning with urination Is Patient Diabetic? No   Primary Care Provider:  Gordy Savers  MD  CC:  c/o leg pain - having epidural block on tuesday  and also c/o burning with urination.  History of Present Illness: 10 -year-old patient who is seen today for follow-up.  He is scheduled for a spinal epidurals next week due to severe spinal and foraminal stenoses.  He complains of some burning, dysuria, and was concerned about a UTI.  His Coumadin has been on hold prior to his epidural.  He has hypertension, heart failure, and COPD  Preventive Screening-Counseling & Management  Alcohol-Tobacco     Smoking Status: never  Allergies: 1)  ! Novocain (Procaine Hcl)  Past History:  Past Medical History: Atrial fibrillation COPD Hypertension Hypothyroidism Peptic ulcer disease Peripheral vascular disease DJD Congestive heart failure Coronary artery disease left brain Cardioembolic stroke May 2010 Osteoarthritis spinal stenosis and multilevel foraminal stenosis  Social History: Smoking Status:  never  Review of Systems       The patient complains of muscle weakness and difficulty walking.  The patient denies anorexia, fever, weight loss, weight gain, vision loss, decreased hearing, hoarseness, chest pain, syncope, dyspnea on exertion, peripheral edema, prolonged cough, headaches, hemoptysis, abdominal pain, melena, hematochezia, severe indigestion/heartburn, hematuria, incontinence, genital sores, suspicious skin lesions, transient blindness, depression, unusual weight change, abnormal bleeding, enlarged lymph nodes, angioedema, breast masses, and testicular masses.    Physical Exam  General:   elderly alert, no distress walks with a cane.  Blood pressure low normal Head:  Normocephalic and atraumatic without obvious abnormalities. No apparent alopecia or balding. Mouth:  Oral mucosa and oropharynx without lesions or exudates.  Teeth in good repair. Neck:  No deformities, masses, or tenderness noted. Lungs:  Normal respiratory effort, chest expands symmetrically. Lungs are clear to auscultation, no crackles or wheezes. Heart:  Normal rate and regular rhythm. S1 and S2 normal without gallop, murmur, click, rub or other extra sounds. Abdomen:  Bowel sounds positive,abdomen soft and non-tender without masses, organomegaly or hernias noted. Msk:  No deformity or scoliosis noted of thoracic or lumbar spine.   Extremities:  No clubbing, cyanosis, edema, or deformity noted with normal full range of motion of all joints.     Impression & Recommendations:  Problem # 1:  SPINAL STENOSIS (ICD-724.00)  Problem # 2:  URINARY URGENCY (ZOX-096.04)  Problem # 3:  OSTEOARTHRITIS (ICD-715.90)  His updated medication list for this problem includes:    Anacin 81 Mg Tbec (Aspirin) ..... One daily    Tramadol Hcl 50 Mg Tabs (Tramadol hcl) ..... One every 6 hours as needed for pain    Hydrocodone-acetaminophen 5-500 Mg Tabs (Hydrocodone-acetaminophen) ..... One every 6 hours for pain  His updated medication list for this problem includes:    Anacin 81 Mg Tbec (Aspirin) ..... One daily    Tramadol Hcl 50 Mg Tabs (Tramadol hcl) ..... One every 6 hours as needed for pain    Hydrocodone-acetaminophen 5-500 Mg Tabs (Hydrocodone-acetaminophen) ..... One every 6 hours for pain  Complete Medication List: 1)  Levothroid 50 Mcg Tabs (Levothyroxine sodium) .Marland KitchenMarland KitchenMarland Kitchen 1  once daily 2)  Eql Omeprazole 20 Mg Tbec (Omeprazole) .Marland Kitchen.. 1 once daily 3)  Flomax 0.4 Mg Cp24 (Tamsulosin hcl) .Marland Kitchen.. 1 once daily 4)  Lorazepam 0.5 Mg Tabs (Lorazepam) .Marland Kitchen.. 1 two times a day as needed 5)  Nitroquick 0.4 Mg Subl (Nitroglycerin)  .... Use as dir as needed 6)  Symbicort 80-4.5 Mcg/act Aero (Budesonide-formoterol fumarate) .... Two puffs twice daily as needed 7)  Anacin 81 Mg Tbec (Aspirin) .... One daily 8)  Warfarin Sodium 2.5 Mg Tabs (Warfarin sodium) .... One daily 9)  Ipratropium-albuterol 0.5-2.5 (3) Mg/67ml Soln (Ipratropium-albuterol) .... Use three times daily as needed 10)  Furosemide 40 Mg Tabs (Furosemide) .... One half a tablet in the morning  monday wednesday, friday only 11)  Androgel Pump 1 % Gel (Testosterone) .... Use daily 12)  Tamsulosin Hcl 0.4 Mg Caps (Tamsulosin hcl) .... One daily 13)  Klor-con 10 10 Meq Cr-tabs (Potassium chloride) .... One daily 14)  Coreg 3.125 Mg Tabs (Carvedilol) .... One half tablet twice daily 15)  Tramadol Hcl 50 Mg Tabs (Tramadol hcl) .... One every 6 hours as needed for pain 16)  Hydrocodone-acetaminophen 5-500 Mg Tabs (Hydrocodone-acetaminophen) .... One every 6 hours for pain  Other Orders: UA Dipstick w/o Micro (manual) (04540)  Patient Instructions: 1)  Please schedule a follow-up appointment in 3 months. 2)  Limit your Sodium (Salt) to less than 2 grams a day(slightly less than 1/2 a teaspoon) to prevent fluid retention, swelling, or worsening of symptoms.  Laboratory Results   Urine Tests    Routine Urinalysis   Color: yellow Appearance: Clear Glucose: negative   (Normal Range: Negative) Bilirubin: negative   (Normal Range: Negative) Ketone: negative   (Normal Range: Negative) Spec. Gravity: 1.010   (Normal Range: 1.003-1.035) Blood: trace-lysed   (Normal Range: Negative) pH: 7.0   (Normal Range: 5.0-8.0) Protein: trace   (Normal Range: Negative) Urobilinogen: 0.2   (Normal Range: 0-1) Nitrite: negative   (Normal Range: Negative) Leukocyte Esterace: negative   (Normal Range: Negative)

## 2010-05-06 NOTE — Assessment & Plan Note (Signed)
Summary: RESPIRATORY CONCERNS // RS   Vital Signs:  Patient profile:   77 year old male Weight:      118 pounds O2 Sat:      97 % on Room air Temp:     97.7 degrees F oral BP sitting:   132 / 80  (right arm) Cuff size:   regular  Vitals Entered By: Duard Brady LPN (March 13, 2010 11:18 AM)  O2 Flow:  Room air CC: c/o SOB and nausea Is Patient Diabetic? No   Primary Care Provider:  Gordy Savers  MD  CC:  c/o SOB and nausea.  History of Present Illness: 77 year old patient seen today   for follow-up.  He has advanced COPD, as well as chronic systolic heart failure.  He has chronic atrial fibrillation and  earlier Coreg was increased to b.i.d. regimen and Lanoxin added due to a uncontrolled ventricular response.  He still complains of shortness of breath.  His oxygen saturation today 96-97.  He denies any cough.  He does use her nocturnal O2 and also throughout the day periodically.  Allergies: 1)  ! Novocain (Procaine Hcl)  Physical Exam  General:  elderly frail, no apparent distress Head:  Normocephalic and atraumatic without obvious abnormalities. No apparent alopecia or balding. Mouth:  Oral mucosa and oropharynx without lesions or exudates.  Teeth in good repair. Neck:  No deformities, mno neck vein distentionsses, or tenderness noted. Lungs:  chest clear.  O2 saturation 97% Heart:  irregular rhythm, rate 71 Abdomen:  Bowel sounds positive,abdomen soft and non-tender without masses, organomegaly or hernias noted. Extremities:  no edema   Impression & Recommendations:  Problem # 1:  CHRONIC SYSTOLIC HEART FAILURE (ICD-428.22)  His updated medication list for this problem includes:    Furosemide 40 Mg Tabs (Furosemide) ..... One half a tablet in the morning  monday wednesday, friday only    Coreg 3.125 Mg Tabs (Carvedilol) .Marland Kitchen..Marland Kitchen Two   tablet twice daily    Plavix 75 Mg Tabs (Clopidogrel bisulfate) ..... One daily    Digoxin 0.125 Mg Tabs (Digoxin)  ..... One daily  Problem # 2:  ATRIAL FIBRILLATION (ICD-427.31)  His updated medication list for this problem includes:    Coreg 3.125 Mg Tabs (Carvedilol) .Marland Kitchen..Marland Kitchen Two   tablet twice daily    Plavix 75 Mg Tabs (Clopidogrel bisulfate) ..... One daily    Digoxin 0.125 Mg Tabs (Digoxin) ..... One daily  Complete Medication List: 1)  Levothroid 50 Mcg Tabs (Levothyroxine sodium) .Marland Kitchen.. 1 once daily 2)  Eql Omeprazole 20 Mg Tbec (Omeprazole) .Marland Kitchen.. 1 once daily 3)  Lorazepam 0.5 Mg Tabs (Lorazepam) .Marland Kitchen.. 1 two times a day as needed 4)  Nitroquick 0.4 Mg Subl (Nitroglycerin) .... Use as dir as needed 5)  Symbicort 80-4.5 Mcg/act Aero (Budesonide-formoterol fumarate) .... Two puffs twice daily as needed 6)  Ipratropium-albuterol 0.5-2.5 (3) Mg/57ml Soln (Ipratropium-albuterol) .... Use three times daily as needed 7)  Furosemide 40 Mg Tabs (Furosemide) .... One half a tablet in the morning  monday wednesday, friday only 8)  Tamsulosin Hcl 0.4 Mg Caps (Tamsulosin hcl) .... One daily 9)  Klor-con 10 10 Meq Cr-tabs (Potassium chloride) .... One daily 10)  Coreg 3.125 Mg Tabs (Carvedilol) .... Two   tablet twice daily 11)  Tramadol Hcl 50 Mg Tabs (Tramadol hcl) .... One every 6 hours as needed for pain 12)  Avodart 0.5 Mg Caps (Dutasteride) .... One daily 13)  Fentanyl 25 Mcg/hr Pt72 (Fentanyl) .Marland KitchenMarland KitchenMarland Kitchen  Use every 72 hours 14)  Plavix 75 Mg Tabs (Clopidogrel bisulfate) .... One daily 15)  Miralax Powd (Polyethylene glycol 3350) .... One scoop in 8 ounces of water once or twice daily for constipation 16)  Digoxin 0.125 Mg Tabs (Digoxin) .... One daily  Patient Instructions: 1)  Please schedule a follow-up appointment in 2 months. 2)  Limit your Sodium (Salt).   Orders Added: 1)  Est. Patient Level III [28413]

## 2010-05-06 NOTE — Assessment & Plan Note (Signed)
Summary: URINARY RETENTION/PS   Vital Signs:  Patient profile:   77 year old male Weight:      123 pounds Temp:     97.6 degrees F oral BP sitting:   140 / 80  (right arm) Cuff size:   regular  Vitals Entered By: Duard Brady LPN (January 13, 2010 10:12 AM) CC: just needs to talk with doctor - sevral issus to dicuss Is Patient Diabetic? No   Primary Care Piercen Covino:  Gordy Savers  MD  CC:  just needs to talk with doctor - sevral issus to dicuss.  History of Present Illness: 77 year old patient who is seen today for follow-up.  He has done remarkably well since his last visit.  He has a history of chronic systolic heart failure, as well as advanced COPD.  He has spinal stenosis and chronic leg pain.  He has done well on his present analgesic regimen.  His cardiopulmonary status has been stable.  His only complaint today is leg pain.  He has coronary artery disease, but denies any chest pain.  He has tolerated his medications well.  There have been no falls.  No confusion, etc.  Allergies: 1)  ! Novocain (Procaine Hcl)  Past History:  Past Medical History: Reviewed history from 08/30/2009 and no changes required. Atrial fibrillation COPD Hypertension Hypothyroidism Peptic ulcer disease Peripheral vascular disease DJD Congestive heart failure Coronary artery disease left brain Cardioembolic stroke May 2010 Osteoarthritis spinal stenosis and multilevel foraminal stenosis  Review of Systems       The patient complains of dyspnea on exertion, muscle weakness, and difficulty walking.  The patient denies anorexia, fever, weight loss, weight gain, vision loss, decreased hearing, hoarseness, chest pain, syncope, peripheral edema, prolonged cough, headaches, hemoptysis, abdominal pain, melena, hematochezia, severe indigestion/heartburn, hematuria, incontinence, genital sores, suspicious skin lesions, transient blindness, depression, unusual weight change, abnormal  bleeding, enlarged lymph nodes, angioedema, breast masses, and testicular masses.    Physical Exam  General:  Well-developed,well-nourished,in no acute distress; alert,appropriate and cooperative throughout examination Head:  Normocephalic and atraumatic without obvious abnormalities. No apparent alopecia or balding. Eyes:  No corneal or conjunctival inflammation noted. EOMI. Perrla. Funduscopic exam benign, without hemorrhages, exudates or papilledema. Vision grossly normal. Mouth:  Oral mucosa and oropharynx without lesions or exudates.  Teeth in good repair. Neck:  No deformities, masses, or tenderness noted. Lungs:  diminished breath sounds, but clear Heart:  controlled ventricular response Abdomen:  Bowel sounds positive,abdomen soft and non-tender without masses, organomegaly or hernias noted. Extremities:  no peripheral edema   Impression & Recommendations:  Problem # 1:  SPINAL STENOSIS (ICD-724.00)  Problem # 2:  LEG PAIN, BILATERAL (ICD-729.5)  Problem # 3:  CHRONIC OBSTRUCTIVE PULMONARY DISEASE, ACUTE EXACERBATION (ICD-491.21)  Problem # 4:  CORONARY ARTERY DISEASE (ICD-414.00)  His updated medication list for this problem includes:    Nitroquick 0.4 Mg Subl (Nitroglycerin) ..... Use as dir as needed    Anacin 81 Mg Tbec (Aspirin) ..... One daily    Furosemide 40 Mg Tabs (Furosemide) ..... One half a tablet in the morning  monday wednesday, friday only    Coreg 3.125 Mg Tabs (Carvedilol) ..... One half tablet twice daily    Plavix 75 Mg Tabs (Clopidogrel bisulfate) ..... One daily  His updated medication list for this problem includes:    Nitroquick 0.4 Mg Subl (Nitroglycerin) ..... Use as dir as needed    Anacin 81 Mg Tbec (Aspirin) ..... One daily    Furosemide 40  Mg Tabs (Furosemide) ..... One half a tablet in the morning  monday wednesday, friday only    Coreg 3.125 Mg Tabs (Carvedilol) ..... One half tablet twice daily    Plavix 75 Mg Tabs (Clopidogrel bisulfate)  ..... One daily  Complete Medication List: 1)  Levothroid 50 Mcg Tabs (Levothyroxine sodium) .Marland Kitchen.. 1 once daily 2)  Eql Omeprazole 20 Mg Tbec (Omeprazole) .Marland Kitchen.. 1 once daily 3)  Lorazepam 0.5 Mg Tabs (Lorazepam) .Marland Kitchen.. 1 two times a day as needed 4)  Nitroquick 0.4 Mg Subl (Nitroglycerin) .... Use as dir as needed 5)  Symbicort 80-4.5 Mcg/act Aero (Budesonide-formoterol fumarate) .... Two puffs twice daily as needed 6)  Anacin 81 Mg Tbec (Aspirin) .... One daily 7)  Ipratropium-albuterol 0.5-2.5 (3) Mg/64ml Soln (Ipratropium-albuterol) .... Use three times daily as needed 8)  Furosemide 40 Mg Tabs (Furosemide) .... One half a tablet in the morning  monday wednesday, friday only 9)  Tamsulosin Hcl 0.4 Mg Caps (Tamsulosin hcl) .... One daily 10)  Klor-con 10 10 Meq Cr-tabs (Potassium chloride) .... One daily 11)  Coreg 3.125 Mg Tabs (Carvedilol) .... One half tablet twice daily 12)  Tramadol Hcl 50 Mg Tabs (Tramadol hcl) .... One every 6 hours as needed for pain 13)  Hydrocodone-acetaminophen 5-500 Mg Tabs (Hydrocodone-acetaminophen) .... One every 6 hours for pain 14)  Ipratropium Bromide 0.02 % Soln (Ipratropium bromide) .... Use two times a day 15)  Avodart 0.5 Mg Caps (Dutasteride) .... One daily 16)  Fentanyl 25 Mcg/hr Pt72 (Fentanyl) .... Use every 72 hours 17)  Plavix 75 Mg Tabs (Clopidogrel bisulfate) .... One daily 18)  Miralax Powd (Polyethylene glycol 3350) .... One scoop in 8 ounces of water once or twice daily for constipation  Other Orders: Influenza Vaccine MCR (62952)  Patient Instructions: 1)  Please schedule a follow-up appointment in 3 months. 2)  Limit your Sodium (Salt) to less than 2 grams a day(slightly less than 1/2 a teaspoon) to prevent fluid retention, swelling, or worsening of symptoms. 3)  It is important that you exercise regularly at least 20 minutes 5 times a week. If you develop chest pain, have severe difficulty breathing, or feel very tired , stop exercising  immediately and seek medical attention.   Immunizations Administered:  Influenza Vaccine # 1:    Vaccine Type: Fluvax MCR    Site: right deltoid    Mfr: [employer]    Dose: 0.5 ml    Route: IM    Given by: Duard Brady LPN    Exp. Date: 10/04/2010    Lot #: [id number]    VIS given: 10/29/09 version given January 13, 2010.  Flu Vaccine Consent Questions:    Do you have a history of severe allergic reactions to this vaccine? no    Any prior history of allergic reactions to egg and/or gelatin? no    Do you have a sensitivity to the preservative Thimersol? no    Do you have a past history of Guillan-Barre Syndrome? no    Do you currently have an acute febrile illness? no    Have you ever had a severe reaction to latex? no    Vaccine information given and explained to patient? yes

## 2010-05-06 NOTE — Assessment & Plan Note (Signed)
Summary: chest congestion/cjr/pt rsc/cjr   Vital Signs:  Patient profile:   77 year old male O2 Sat:      89 % on Room air Temp:     97.7 degrees F oral BP sitting:   126 / 80  (right arm) Cuff size:   regular  O2 Flow:  Room air CC: c/o chest congestion worse Is Patient Diabetic? No   Primary Care Provider:  Gordy Savers  MD  CC:  c/o chest congestion worse.  History of Present Illness:  77 year old patient who is seen today for follow-up of an exacerbation of COPD.  He still feels unwell with congested cough yielding small amounts of yellow sputum.  He is using oxygen therapy at home, as well as frequent nebulizer treatments.  Today, was his last day of 7 days of Avelox antibiotic therapy.  He also complains of calf pain.  This is now described as a more constant dullness and he denies the prior severe shooting, sharp pain it prompted use of fentanyl, and thought related to spinal stenosis.  He no longer uses fentanyl.  Denies any wheezing or shortness of breath.  He does have a history of heart failure, but denies any PND, orthopnea, or worsening peripheral edema  Allergies: 1)  ! Novocain (Procaine Hcl)  Past History:  Past Medical History: Reviewed history from 08/30/2009 and no changes required. Atrial fibrillation COPD Hypertension Hypothyroidism Peptic ulcer disease Peripheral vascular disease DJD Congestive heart failure Coronary artery disease left brain Cardioembolic stroke May 2010 Osteoarthritis spinal stenosis and multilevel foraminal stenosis  Review of Systems       The patient complains of anorexia, dyspnea on exertion, prolonged cough, muscle weakness, and difficulty walking.  The patient denies fever, weight loss, weight gain, vision loss, decreased hearing, hoarseness, chest pain, syncope, peripheral edema, headaches, hemoptysis, abdominal pain, melena, hematochezia, severe indigestion/heartburn, hematuria, incontinence, genital sores,  suspicious skin lesions, transient blindness, depression, unusual weight change, abnormal bleeding, enlarged lymph nodes, angioedema, breast masses, and testicular masses.    Physical Exam  General:  elderly frail, no distress at rest.  Blood pressure stable;  pulse rate 70 Head:  Normocephalic and atraumatic without obvious abnormalities. No apparent alopecia or balding. Eyes:  No corneal or conjunctival inflammation noted. EOMI. Perrla. Funduscopic exam benign, without hemorrhages, exudates or papilledema. Vision grossly normal. Mouth:  Oral mucosa and oropharynx without lesions or exudates.  Teeth in good repair. Neck:  No deformities, masses, or tenderness noted. Lungs:  normal respiratory effort, no intercostal retractions, and no accessory muscle use.  a few rare rhonchi.  Breath sounds, perhaps slightly diminished at the bases, left greater than the right.  O2 saturation 91-92% on room airnormal respiratory effort, no intercostal retractions, and no accessory muscle use.   Heart:  irregular rhythm, with a controlled ventricular response Abdomen:  Bowel sounds positive,abdomen soft and non-tender without masses, organomegaly or hernias noted. Msk:  no calf tenderness Extremities:  no edema   Impression & Recommendations:  Problem # 1:  URI (ICD-465.9)  His updated medication list for this problem includes:    Anacin 81 Mg Tbec (Aspirin) ..... One daily  His updated medication list for this problem includes:    Anacin 81 Mg Tbec (Aspirin) ..... One daily  Problem # 2:  CHRONIC OBSTRUCTIVE PULMONARY DISEASE, ACUTE EXACERBATION (ICD-491.21)  Orders: T-2 View CXR (71020TC) Venipuncture (16109) TLB-BMP (Basic Metabolic Panel-BMET) (80048-METABOL) TLB-CBC Platelet - w/Differential (85025-CBCD) TLB-BNP (B-Natriuretic Peptide) (83880-BNPR) Specimen Handling (60454)  Complete Medication  List: 1)  Levothroid 50 Mcg Tabs (Levothyroxine sodium) .Marland Kitchen.. 1 once daily 2)  Eql Omeprazole 20  Mg Tbec (Omeprazole) .Marland Kitchen.. 1 once daily 3)  Lorazepam 0.5 Mg Tabs (Lorazepam) .Marland Kitchen.. 1 two times a day as needed 4)  Nitroquick 0.4 Mg Subl (Nitroglycerin) .... Use as dir as needed 5)  Symbicort 80-4.5 Mcg/act Aero (Budesonide-formoterol fumarate) .... Two puffs twice daily as needed 6)  Anacin 81 Mg Tbec (Aspirin) .... One daily 7)  Ipratropium-albuterol 0.5-2.5 (3) Mg/63ml Soln (Ipratropium-albuterol) .... Use three times daily as needed 8)  Furosemide 40 Mg Tabs (Furosemide) .... One half a tablet in the morning  monday wednesday, friday only 9)  Tamsulosin Hcl 0.4 Mg Caps (Tamsulosin hcl) .... One daily 10)  Klor-con 10 10 Meq Cr-tabs (Potassium chloride) .... One daily 11)  Coreg 3.125 Mg Tabs (Carvedilol) .... One half tablet twice daily 12)  Tramadol Hcl 50 Mg Tabs (Tramadol hcl) .... One every 6 hours as needed for pain 13)  Hydrocodone-acetaminophen 5-500 Mg Tabs (Hydrocodone-acetaminophen) .... One every 6 hours for pain 14)  Ipratropium Bromide 0.02 % Soln (Ipratropium bromide) .... Use two times a day 15)  Avodart 0.5 Mg Caps (Dutasteride) .... One daily 16)  Fentanyl 25 Mcg/hr Pt72 (Fentanyl) .... Use every 72 hours 17)  Plavix 75 Mg Tabs (Clopidogrel bisulfate) .... One daily 18)  Miralax Powd (Polyethylene glycol 3350) .... One scoop in 8 ounces of water once or twice daily for constipation 19)  Azithromycin 250 Mg Tabs (Azithromycin) .... Two initially, then one daily for 4 additional days  Patient Instructions: 1)  Take your antibiotic as prescribed until ALL of it is gone, but stop if you develop a rash or swelling and contact our office as soon as possible. 2)  call for any worsening symptoms and report to the emergency department if  you  develops significant shortness of breath 3)  Mucinex -use twice daily Prescriptions: AZITHROMYCIN 250 MG TABS (AZITHROMYCIN) two initially, then one daily for 4 additional days  #6 x 0   Entered and Authorized by:   Gordy Savers   MD   Signed by:   Gordy Savers  MD on 12/05/2009   Method used:   Electronically to        [facility]  [phone]* (retail)       [address] [place], [place]  [address]       Ph: [phone] or [phone]       Fax: [phone]   RxID:   [id number]

## 2010-05-06 NOTE — Assessment & Plan Note (Signed)
Summary: legs are shaky--unstable--ok per dr k//ccm   Vital Signs:  Patient profile:   77 year old male Temp:     98.0 degrees F oral BP sitting:   110 / 60  (right arm) Cuff size:   regular  Vitals Entered By: Duard Brady LPN (October 03, 2009 3:22 PM) CC: c/o legs gave away Is Patient Diabetic? No   Primary Care Provider:  Gordy Savers  MD  CC:  c/o legs gave away.  History of Present Illness: 77 -year-old patient seen today for follow-up.  He has a history of severe spinal stenosis and her today felt his legs were weaker to the point he felt he was a fall risk.  He did not actually fall but required use of his 4 point walker.  He also has a motorized wheelchair at home.  There's been no worsening back pain or leg pain, but he did describe a sense of both legs being cold for a period of time.  At the present time.  He feels that his gait is somewhat more unstable, but he is able to ambulate.  No bowel or bladder incontinence  Allergies: 1)  ! Novocain (Procaine Hcl)  Past History:  Past Medical History: Reviewed history from 08/30/2009 and no changes required. Atrial fibrillation COPD Hypertension Hypothyroidism Peptic ulcer disease Peripheral vascular disease DJD Congestive heart failure Coronary artery disease left brain Cardioembolic stroke May 2010 Osteoarthritis spinal stenosis and multilevel foraminal stenosis  Physical Exam  General:  alert elderly, frail, anxious, no distress.  Blood pressure 110/70 Lungs:  Normal respiratory effort, chest expands symmetrically. Lungs are clear to auscultation, no crackles or wheezes. Heart:  Normal rate and regular rhythm. S1 and S2 normal without gallop, murmur, click, rub or other extra sounds. Extremities:  no edema Neurologic:  abnormal gait.  patient's gait was unsteady with small tentative steps.  He was able to rise out of a chair and transferred to an examining table.  Patellar and Achilles reflexes were  symmetrically depressed and not hyperreflexic.  Moderate bilateral leg weakness noted, which has been chronic.  He was able to raise both legs off the examining table and hold against gravity   Impression & Recommendations:  Problem # 1:  SPINAL STENOSIS (ICD-724.00)  Problem # 2:  OSTEOARTHRITIS (ICD-715.90)  His updated medication list for this problem includes:    Anacin 81 Mg Tbec (Aspirin) ..... One daily    Tramadol Hcl 50 Mg Tabs (Tramadol hcl) ..... One every 6 hours as needed for pain    Hydrocodone-acetaminophen 5-500 Mg Tabs (Hydrocodone-acetaminophen) ..... One every 6 hours for pain  Complete Medication List: 1)  Levothroid 50 Mcg Tabs (Levothyroxine sodium) .Marland Kitchen.. 1 once daily 2)  Eql Omeprazole 20 Mg Tbec (Omeprazole) .Marland Kitchen.. 1 once daily 3)  Lorazepam 0.5 Mg Tabs (Lorazepam) .Marland Kitchen.. 1 two times a day as needed 4)  Nitroquick 0.4 Mg Subl (Nitroglycerin) .... Use as dir as needed 5)  Symbicort 80-4.5 Mcg/act Aero (Budesonide-formoterol fumarate) .... Two puffs twice daily as needed 6)  Anacin 81 Mg Tbec (Aspirin) .... One daily 7)  Warfarin Sodium 2.5 Mg Tabs (Warfarin sodium) .... One daily 8)  Ipratropium-albuterol 0.5-2.5 (3) Mg/72ml Soln (Ipratropium-albuterol) .... Use three times daily as needed 9)  Furosemide 40 Mg Tabs (Furosemide) .... One half a tablet in the morning  monday wednesday, friday only 10)  Androgel Pump 1 % Gel (Testosterone) .... Use daily 11)  Tamsulosin Hcl 0.4 Mg Caps (Tamsulosin hcl) .... One  daily 12)  Klor-con 10 10 Meq Cr-tabs (Potassium chloride) .... One daily 13)  Coreg 3.125 Mg Tabs (Carvedilol) .... One half tablet twice daily 14)  Tramadol Hcl 50 Mg Tabs (Tramadol hcl) .... One every 6 hours as needed for pain 15)  Hydrocodone-acetaminophen 5-500 Mg Tabs (Hydrocodone-acetaminophen) .... One every 6 hours for pain  Patient Instructions: 1)  call if leg weakness worsens 2)  return as scheduled for your routine follow-up

## 2010-05-06 NOTE — Assessment & Plan Note (Signed)
Summary: INSOMNIA // RS   Vital Signs:  Patient profile:   77 year old male Weight:      122 pounds Temp:     98.0 degrees F oral BP sitting:   140 / 80  (right arm) Cuff size:   regular CC: c/o insomnia Is Patient Diabetic? No   Primary Care Provider:  Gordy Savers  MD  CC:  c/o insomnia.  History of Present Illness: 29 -year-old patient who has a history of advanced COPD, as well as chronic systolic heart failure. for the past several nights, he has had some insomnia issues.  On closer questioning, he has some air hunger at night in spite of using nocturnal O2.  He has not been using furosemide.  He has on his inhalational medications faithfully.  Denies any chest pain or productive cough.  Has very little shortness of breath during the day on room air  Allergies: 1)  ! Novocain (Procaine Hcl)  Past History:  Past Medical History: Reviewed history from 08/30/2009 and no changes required. Atrial fibrillation COPD Hypertension Hypothyroidism Peptic ulcer disease Peripheral vascular disease DJD Congestive heart failure Coronary artery disease left brain Cardioembolic stroke May 2010 Osteoarthritis spinal stenosis and multilevel foraminal stenosis  Family History: Reviewed history from 08/06/2008 and no changes required. father died age 70, tuberculosis.   Mother died age 9.  Two sisters positive for dementia breast cancer, renal cancer  Social History: Reviewed history from 02/29/2008 and no changes required. Married spends considerable time at [facility]  Review of Systems       The patient complains of dyspnea on exertion.  The patient denies anorexia, fever, weight loss, weight gain, vision loss, decreased hearing, hoarseness, chest pain, syncope, peripheral edema, prolonged cough, headaches, hemoptysis, abdominal pain, melena, hematochezia, severe indigestion/heartburn, hematuria, incontinence, genital sores, muscle weakness, suspicious skin  lesions, transient blindness, difficulty walking, depression, unusual weight change, abnormal bleeding, enlarged lymph nodes, angioedema, breast masses, and testicular masses.    Physical Exam  General:  underweight appearing.  normal blood pressure Head:  Normocephalic and atraumatic without obvious abnormalities. No apparent alopecia or balding. Eyes:  No corneal or conjunctival inflammation noted. EOMI. Perrla. Funduscopic exam benign, without hemorrhages, exudates or papilledema. Vision grossly normal. Mouth:  Oral mucosa and oropharynx without lesions or exudates.  Teeth in good repair. Neck:  No deformities, masses, or tenderness noted. Lungs:  Normal respiratory effort, chest expands symmetrically. Lungs are clear to auscultation, no crackles or wheezes. O2 saturation 95 on room air Heart:  controlled ventricular response Extremities:  no peripheral edema   Impression & Recommendations:  Problem # 1:  DYSPNEA (ICD-786.05)  Problem # 2:  CHRONIC OBSTRUCTIVE PULMONARY DISEASE, ACUTE EXACERBATION (ICD-491.21)  Problem # 3:  CHRONIC SYSTOLIC HEART FAILURE (ICD-428.22)  His updated medication list for this problem includes:    Furosemide 40 Mg Tabs (Furosemide) ..... One half a tablet in the morning  monday wednesday, friday only    Coreg 3.125 Mg Tabs (Carvedilol) ..... One   tablet twice daily    Plavix 75 Mg Tabs (Clopidogrel bisulfate) ..... One daily  Complete Medication List: 1)  Levothroid 50 Mcg Tabs (Levothyroxine sodium) .Marland Kitchen.. 1 once daily 2)  Eql Omeprazole 20 Mg Tbec (Omeprazole) .Marland Kitchen.. 1 once daily 3)  Lorazepam 0.5 Mg Tabs (Lorazepam) .Marland Kitchen.. 1 two times a day as needed 4)  Nitroquick 0.4 Mg Subl (Nitroglycerin) .... Use as dir as needed 5)  Symbicort 80-4.5 Mcg/act Aero (Budesonide-formoterol fumarate) .... Two puffs  twice daily as needed 6)  Ipratropium-albuterol 0.5-2.5 (3) Mg/58ml Soln (Ipratropium-albuterol) .... Use three times daily as needed 7)  Furosemide 40 Mg  Tabs (Furosemide) .... One half a tablet in the morning  monday wednesday, friday only 8)  Tamsulosin Hcl 0.4 Mg Caps (Tamsulosin hcl) .... One daily 9)  Klor-con 10 10 Meq Cr-tabs (Potassium chloride) .... One daily 10)  Coreg 3.125 Mg Tabs (Carvedilol) .... One   tablet twice daily 11)  Tramadol Hcl 50 Mg Tabs (Tramadol hcl) .... One every 6 hours as needed for pain 12)  Avodart 0.5 Mg Caps (Dutasteride) .... One daily 13)  Fentanyl 25 Mcg/hr Pt72 (Fentanyl) .... Use every 72 hours 14)  Plavix 75 Mg Tabs (Clopidogrel bisulfate) .... One daily 15)  Miralax Powd (Polyethylene glycol 3350) .... One scoop in 8 ounces of water once or twice daily for constipation 16)  Ciprofloxacin Hcl 500 Mg Tabs (Ciprofloxacin hcl) .... One twice daily  Patient Instructions: 1)  Limit your Sodium (Salt). 2)  take furosemide 20 mg daily for 7 days, along with potassium; 3)  and start taking the furosemide, every Monday, Wednesday, and Friday morning.  If improved ad  Orders Added: 1)  Est. Patient Level IV [16109]

## 2010-05-06 NOTE — Progress Notes (Signed)
Summary: Update on low BP  Phone Note Call from Patient   Caller: [facility] Call For: Corey Savers  MD Summary of Call: 95/50 , 85/50, no dizziness.  encouraged him to drink more today.  Has appt tomorrow with Dr. Kirtland Bouchard Pulse : 76 irreg, but is always irreg.  95% Room Air  Pt. took Lasix 20 mg today and his Coreg 3.125mg  one po daily.  Takes Flomax.. [phone]   Initial call taken by: [name] CMA,  September 30, 2009 12:42 PM  Follow-up for Phone Call        see [place]arrow Follow-up by: Corey Savers  MD,  September 30, 2009 1:00 PM  Additional Follow-up for Phone Call Additional follow up Details #1::        spoke to pt's wife. Additional Follow-up by: [name] CMA,  September 30, 2009 2:01 PM

## 2010-05-06 NOTE — Assessment & Plan Note (Signed)
Summary: leg pain/ccm   Vital Signs:  Patient profile:   77 year old male Weight:      124 pounds Temp:     97.5 degrees F oral BP sitting:   140 / 80  (right arm) Cuff size:   regular  Vitals Entered By: Duard Brady LPN (October 24, 2009 10:59 AM) CC: c/o leg pain   Primary Care Provider:  Gordy Savers  MD  CC:  c/o leg pain.  History of Present Illness: 77 year old patient who is seen today for follow-up of his bilateral leg pain.  He has severe spinal stenosis.  Today he is somewhat better.  He does have scooters wheelchairs walkers and a cane.  He continues to receive a home physical therapy with some benefit.  He is exercising regularly.  He takes Aleve occasionally and often  hydrocodone.    His cardiopulmonary  status has been stable    Allergies: 1)  ! Novocain (Procaine Hcl)  Past History:  Past Medical History: Reviewed history from 08/30/2009 and no changes required. Atrial fibrillation COPD Hypertension Hypothyroidism Peptic ulcer disease Peripheral vascular disease DJD Congestive heart failure Coronary artery disease left brain Cardioembolic stroke May 2010 Osteoarthritis spinal stenosis and multilevel foraminal stenosis  Physical Exam  General:  elderly alert, comfortable at rest;  using cane; blood  pressure 140/80 Neck:  No deformities, masses, or tenderness noted. Lungs:  Normal respiratory effort, chest expands symmetrically. Lungs are clear to auscultation, no crackles or wheezes. Heart:  Normal rate and regular rhythm. S1 and S2 normal without gallop, murmur, click, rub or other extra sounds. Extremities:  no edema    Impression & Recommendations:  Problem # 1:  SPINAL STENOSIS (ICD-724.00)  Problem # 2:  OSTEOARTHRITIS (ICD-715.90)  His updated medication list for this problem includes:    Anacin 81 Mg Tbec (Aspirin) ..... One daily    Tramadol Hcl 50 Mg Tabs (Tramadol hcl) ..... One every 6 hours as needed for pain  Hydrocodone-acetaminophen 5-500 Mg Tabs (Hydrocodone-acetaminophen) ..... One every 6 hours for pain  Problem # 3:  CHRONIC OBSTRUCTIVE PULMONARY DISEASE, ACUTE EXACERBATION (ICD-491.21)  Complete Medication List: 1)  Levothroid 50 Mcg Tabs (Levothyroxine sodium) .Marland Kitchen.. 1 once daily 2)  Eql Omeprazole 20 Mg Tbec (Omeprazole) .Marland Kitchen.. 1 once daily 3)  Lorazepam 0.5 Mg Tabs (Lorazepam) .Marland Kitchen.. 1 two times a day as needed 4)  Nitroquick 0.4 Mg Subl (Nitroglycerin) .... Use as dir as needed 5)  Symbicort 80-4.5 Mcg/act Aero (Budesonide-formoterol fumarate) .... Two puffs twice daily as needed 6)  Anacin 81 Mg Tbec (Aspirin) .... One daily 7)  Warfarin Sodium 2.5 Mg Tabs (Warfarin sodium) .... One daily 8)  Ipratropium-albuterol 0.5-2.5 (3) Mg/60ml Soln (Ipratropium-albuterol) .... Use three times daily as needed 9)  Furosemide 40 Mg Tabs (Furosemide) .... One half a tablet in the morning  monday wednesday, friday only 10)  Tamsulosin Hcl 0.4 Mg Caps (Tamsulosin hcl) .... One daily 11)  Klor-con 10 10 Meq Cr-tabs (Potassium chloride) .... One daily 12)  Coreg 3.125 Mg Tabs (Carvedilol) .... One half tablet twice daily 13)  Tramadol Hcl 50 Mg Tabs (Tramadol hcl) .... One every 6 hours as needed for pain 14)  Hydrocodone-acetaminophen 5-500 Mg Tabs (Hydrocodone-acetaminophen) .... One every 6 hours for pain 15)  Ipratropium Bromide 0.02 % Soln (Ipratropium bromide) .... Use two times a day 16)  Avodart 0.5 Mg Caps (Dutasteride) .... One daily  Patient Instructions: 1)   continue pain medication as directed 2)  continue home physical therapy 3)  Please schedule a follow-up appointment as needed.

## 2010-05-08 NOTE — Miscellaneous (Signed)
Summary: Physician's Orders/[facility]  Physician's Orders/[facility]   Imported By: Maryln Gottron 04/17/2010 10:23:15  _____________________________________________________________________  External Attachment:    Type:   Image     Comment:   External Document

## 2010-05-08 NOTE — Miscellaneous (Signed)
Summary: Face to Face Encounter/[facility]  Face to Face Encounter/[facility]   Imported By: Maryln Gottron 04/21/2010 10:55:02  _____________________________________________________________________  External Attachment:    Type:   Image     Comment:   External Document

## 2010-05-08 NOTE — Assessment & Plan Note (Signed)
Summary: uti/dm   Vital Signs:  Patient profile:   77 year old male Weight:      118 pounds Temp:     98.2 degrees F oral BP sitting:   100 / 70  (right arm) Cuff size:   regular  Vitals Entered By: Duard Brady LPN (April 17, 2010 4:15 PM) CC: c/o frquent urination Is Patient Diabetic? No   Primary Care Provider:  Gordy Savers  MD  CC:  c/o frquent urination.  History of Present Illness: 44 -year-old patient who is seen in follow-up today.  Yesterday he was evaluated in the ED after he tripped over a pet dog sustaining, to the posterior scalp and left arm and shoulder.  He complains of urinary urgency throughout the day today and this is what prompted his visit.  This has been present only today.  He is on Flomax and Avodart.  Denies any dysuria.  UA reviewed today and was normal  Allergies: 1)  ! Novocain (Procaine Hcl)  Past History:  Past Medical History: Reviewed history from 08/30/2009 and no changes required. Atrial fibrillation COPD Hypertension Hypothyroidism Peptic ulcer disease Peripheral vascular disease DJD Congestive heart failure Coronary artery disease left brain Cardioembolic stroke May 2010 Osteoarthritis spinal stenosis and multilevel foraminal stenosis  Review of Systems       The patient complains of anorexia, muscle weakness, and difficulty walking.    Physical Exam  General:  elderly frail, alert, nasal oxygen in place.  Vital signs were stable Head:  Normocephalic and atraumatic without obvious abnormalities. No apparent alopecia or balding. superficial abrasion over the posterior scalp area Eyes:  No corneal or conjunctival inflammation noted. EOMI. Perrla. Funduscopic exam benign, without hemorrhages, exudates or papilledema. Vision grossly normal. Mouth:  Oral mucosa and oropharynx without lesions or exudates.  Teeth in good repair. Neck:  No deformities, masses, or tenderness noted. Lungs:  Normal respiratory effort,  chest expands symmetrically. Lungs are clear to auscultation, no crackles or wheezes.  O2 saturation 95.  Room air Heart:  irregular controlled ventricular response  Abdomen:  Bowel sounds positive,abdomen soft and non-tender without masses, organomegaly or hernias noted. Extremities:  no peripheral edema   Impression & Recommendations:  Problem # 1:  URINARY FREQUENCY (ICD-788.41)  His updated medication list for this problem includes:    Tamsulosin Hcl 0.4 Mg Caps (Tamsulosin hcl) ..... One daily    Avodart 0.5 Mg Caps (Dutasteride) ..... One daily  Orders: UA Dipstick w/o Micro (manual) (16109)  Problem # 2:  CONTUSIONS, MULTIPLE (ICD-924.8)  Complete Medication List: 1)  Levothroid 50 Mcg Tabs (Levothyroxine sodium) .Marland Kitchen.. 1 once daily 2)  Eql Omeprazole 20 Mg Tbec (Omeprazole) .Marland Kitchen.. 1 once daily 3)  Lorazepam 0.5 Mg Tabs (Lorazepam) .Marland Kitchen.. 1 two times a day as needed 4)  Nitroquick 0.4 Mg Subl (Nitroglycerin) .... Use as dir as needed 5)  Symbicort 80-4.5 Mcg/act Aero (Budesonide-formoterol fumarate) .... Two puffs twice daily as needed 6)  Ipratropium-albuterol 0.5-2.5 (3) Mg/87ml Soln (Ipratropium-albuterol) .... Use three times daily as needed 7)  Furosemide 40 Mg Tabs (Furosemide) .... One half a tablet in the morning  monday wednesday, friday only 8)  Tamsulosin Hcl 0.4 Mg Caps (Tamsulosin hcl) .... One daily 9)  Klor-con 10 10 Meq Cr-tabs (Potassium chloride) .... One daily 10)  Coreg 3.125 Mg Tabs (Carvedilol) .... Two   tablet twice daily 11)  Tramadol Hcl 50 Mg Tabs (Tramadol hcl) .... One every 6 hours as needed for pain 12)  Avodart 0.5 Mg Caps (Dutasteride) .... One daily 13)  Fentanyl 25 Mcg/hr Pt72 (Fentanyl) .... Use every 72 hours 14)  Plavix 75 Mg Tabs (Clopidogrel bisulfate) .... One daily 15)  Miralax Powd (Polyethylene glycol 3350) .... One scoop in 8 ounces of water once or twice daily for constipation 16)  Digoxin 0.125 Mg Tabs (Digoxin) .... One  daily  Patient Instructions: 1)  Limit your Sodium (Salt). 2)  increase Flomax to twice daily for 3 days 3)  Call if any worsening or you fail  to improve   Orders Added: 1)  UA Dipstick w/o Micro (manual) [81002] 2)  Est. Patient Level III [16109]    Laboratory Results   Urine Tests  Date/Time Received: April 17, 2010 4:26 PM  Date/Time Reported: April 17, 2010 4:26 PM   Routine Urinalysis   Color: yellow Appearance: Clear Glucose: negative   (Normal Range: Negative) Bilirubin: negative   (Normal Range: Negative) Ketone: negative   (Normal Range: Negative) Spec. Gravity: 1.025   (Normal Range: 1.003-1.035) Blood: negative   (Normal Range: Negative) pH: 6.0   (Normal Range: 5.0-8.0) Protein: trace   (Normal Range: Negative) Urobilinogen: 0.2   (Normal Range: 0-1) Nitrite: negative   (Normal Range: Negative) Leukocyte Esterace: negative   (Normal Range: Negative)

## 2010-05-08 NOTE — Progress Notes (Signed)
Summary: Pt was put on oxygen and would like to get off oxygen  Phone Note Call from Patient Call back at Home Phone [phone]   Caller: Patient Summary of Call: Pt called and has been in hospital for breathing diff. Pt has been discharged from hosp was put on oxygen while in hosp, but now feels like he is addicted to it, and would like to talk with Dr. Amador Cunas about getting off of the oxygen. Pls advise.  Initial call taken by: Lucy Antigua,  March 19, 2010 10:03 AM  Follow-up for Phone Call        called Follow-up by: Gordy Savers  MD,  March 20, 2010 8:00 AM

## 2010-05-08 NOTE — Assessment & Plan Note (Signed)
Summary: SHORTNESS OF BREATH/DLO   Vital Signs:  Patient profile:   77 year old male Weight:      115 pounds BMI:     17.55 O2 Sat:      94 % on Room air Temp:     97.5 degrees F oral Pulse rate:   66 / minute BP sitting:   102 / 60  (left arm)  Vitals Entered By: Doristine Devoid CMA (March 22, 2010 11:58 AM)  O2 Flow:  Room air CC: sob since last night no other sx  Comments -has O2 at home using sometimes when he feels SOB uses 2.19ml    History of Present Illness: Patient seen Saturday working clinic with dyspnea last night. Multiple chronic problems including history of COPD, chronic heart failure, CAD, peripheral vascular disease, and history of cerebrovascular disease. Denies any recent chest pain.  Episode occurred last night while sitting. Similar symptoms in past. He has some chronic mild edema which is unchanged. Been taking Lasix daily. Denies any cough, fever, or chills. No nasal congestive symptoms. Has home O2 but uses  inconsistently. Shortness of breath has resolved this morning. No pleuritic pain or hemoptysis. Ex-smoker.  No orthopnea.  Family concerned about worsening PNA.  Just discharged 4 d ago with community acquired PNA and still on antibiotic.  Current Medications (verified): 1)  Levothroid 50 Mcg  Tabs (Levothyroxine Sodium) .Marland Kitchen.. 1 Once Daily 2)  Eql Omeprazole 20 Mg  Tbec (Omeprazole) .Marland Kitchen.. 1 Once Daily 3)  Lorazepam 0.5 Mg  Tabs (Lorazepam) .Marland Kitchen.. 1 Two Times A Day As Needed 4)  Nitroquick 0.4 Mg  Subl (Nitroglycerin) .... Use As Dir As Needed 5)  Symbicort 80-4.5 Mcg/act  Aero (Budesonide-Formoterol Fumarate) .... Two Puffs Twice Daily As Needed 6)  Ipratropium-Albuterol 0.5-2.5 (3) Mg/19ml Soln (Ipratropium-Albuterol) .... Use Three Times Daily As Needed 7)  Furosemide 40 Mg Tabs (Furosemide) .... One Half A Tablet in The Morning  Monday Wednesday, Friday Only 8)  Tamsulosin Hcl 0.4 Mg Caps (Tamsulosin Hcl) .... One Daily 9)  Klor-Con 10 10 Meq Cr-Tabs  (Potassium Chloride) .... One Daily 10)  Coreg 3.125 Mg Tabs (Carvedilol) .... Two   Tablet Twice Daily 11)  Tramadol Hcl 50 Mg Tabs (Tramadol Hcl) .... One Every 6 Hours As Needed For Pain 12)  Avodart 0.5 Mg Caps (Dutasteride) .... One Daily 13)  Fentanyl 25 Mcg/hr Pt72 (Fentanyl) .... Use Every 72 Hours 14)  Plavix 75 Mg Tabs (Clopidogrel Bisulfate) .... One Daily 15)  Miralax  Powd (Polyethylene Glycol 3350) .... One Scoop in 8 Ounces of Water Once or Twice Daily For Constipation 16)  Digoxin 0.125 Mg Tabs (Digoxin) .... One Daily  Allergies (verified): 1)  ! Novocain (Procaine Hcl)  Past History:  Past Medical History: Last updated: 08/30/2009 Atrial fibrillation COPD Hypertension Hypothyroidism Peptic ulcer disease Peripheral vascular disease DJD Congestive heart failure Coronary artery disease left brain Cardioembolic stroke May 2010 Osteoarthritis spinal stenosis and multilevel foraminal stenosis  Past Surgical History: Last updated: 08/15/2009 Appendectomy Inguinal herniorrhaphy Tonsillectomy Abdominal aortic aneurysm repair Lumbar laminectomy  1967, 1977, 1998  Transurethral resection of prostate   Family History: Last updated: 2008/08/28 father died age 65, tuberculosis.   Mother died age 32.  Two sisters positive for dementia breast cancer, renal cancer  Social History: Last updated: 02/29/2008 Married spends considerable time at [facility]  Risk Factors: Smoking Status: never (01/23/2010) PMH-FH-SH reviewed for relevance  Review of Systems  The patient denies anorexia, fever, hoarseness, chest pain,  syncope, prolonged cough, headaches, hemoptysis, and abdominal pain.    Physical Exam  General:  thin cachectic elderly gentleman who is alert and cooperative and in NAD. Head:  normocephalic and atraumatic.   Ears:  External ear exam shows no significant lesions or deformities.  Otoscopic examination reveals clear canals, tympanic membranes are  intact bilaterally without bulging, retraction, inflammation or discharge. Hearing is grossly normal bilaterally. Mouth:  Oral mucosa and oropharynx without lesions or exudates.  Teeth in good repair. Neck:  No deformities, masses, or tenderness noted. Lungs:  somewhat diminished breath sounds throughout but clear. No rales or wheezes noted. Breath sounds are symmetric Heart:  normal rate and regular rhythm.   Extremities:  trace pitting edema lower legs bilaterally Neurologic:  alert & oriented X3 and cranial nerves II-XII intact.   Cervical Nodes:  No lymphadenopathy noted Psych:  normally interactive, good eye contact, not anxious appearing, and not depressed appearing.     Impression & Recommendations:  Problem # 1:  DYSPNEA (ICD-786.05) Symptoms were transient.  ?etiology.  Back to baseline at this time. No fever, rales, etc to suggest complicated PNA. He has COPD and not using O2.  Encouraged to get back on that as needed   Problem # 2:  CORONARY ARTERY DISEASE (ICD-414.00)  His updated medication list for this problem includes:    Nitroquick 0.4 Mg Subl (Nitroglycerin) ..... Use as dir as needed    Furosemide 40 Mg Tabs (Furosemide) ..... One half a tablet in the morning  monday wednesday, friday only    Coreg 3.125 Mg Tabs (Carvedilol) .Marland Kitchen..Marland Kitchen Two   tablet twice daily    Plavix 75 Mg Tabs (Clopidogrel bisulfate) ..... One daily  Problem # 3:  COPD (ICD-496)  His updated medication list for this problem includes:    Symbicort 80-4.5 Mcg/act Aero (Budesonide-formoterol fumarate) .Marland Kitchen..Marland Kitchen Two puffs twice daily as needed    Ipratropium-albuterol 0.5-2.5 (3) Mg/14ml Soln (Ipratropium-albuterol) ..... Use three times daily as needed  Complete Medication List: 1)  Levothroid 50 Mcg Tabs (Levothyroxine sodium) .Marland Kitchen.. 1 once daily 2)  Eql Omeprazole 20 Mg Tbec (Omeprazole) .Marland Kitchen.. 1 once daily 3)  Lorazepam 0.5 Mg Tabs (Lorazepam) .Marland Kitchen.. 1 two times a day as needed 4)  Nitroquick 0.4 Mg Subl  (Nitroglycerin) .... Use as dir as needed 5)  Symbicort 80-4.5 Mcg/act Aero (Budesonide-formoterol fumarate) .... Two puffs twice daily as needed 6)  Ipratropium-albuterol 0.5-2.5 (3) Mg/7ml Soln (Ipratropium-albuterol) .... Use three times daily as needed 7)  Furosemide 40 Mg Tabs (Furosemide) .... One half a tablet in the morning  monday wednesday, friday only 8)  Tamsulosin Hcl 0.4 Mg Caps (Tamsulosin hcl) .... One daily 9)  Klor-con 10 10 Meq Cr-tabs (Potassium chloride) .... One daily 10)  Coreg 3.125 Mg Tabs (Carvedilol) .... Two   tablet twice daily 11)  Tramadol Hcl 50 Mg Tabs (Tramadol hcl) .... One every 6 hours as needed for pain 12)  Avodart 0.5 Mg Caps (Dutasteride) .... One daily 13)  Fentanyl 25 Mcg/hr Pt72 (Fentanyl) .... Use every 72 hours 14)  Plavix 75 Mg Tabs (Clopidogrel bisulfate) .... One daily 15)  Miralax Powd (Polyethylene glycol 3350) .... One scoop in 8 ounces of water once or twice daily for constipation 16)  Digoxin 0.125 Mg Tabs (Digoxin) .... One daily  Patient Instructions: 1)  followup promptly for any increased shortness of breath, fever, or chest pain. Use home oxygen if you have any recurrent shortness of breath   Orders Added: 1)  Est. Patient Level IV [id number]

## 2010-05-08 NOTE — Progress Notes (Signed)
Summary: congestion  Phone Note Call from Patient   Caller: Patient Call For: Gordy Savers  MD Summary of Call: pt having alot of congestion, wanting to be seen - explained that Dr. Frederica Kuster out of office till tomorrow AM - appt. made. Initial call taken by: Duard Brady LPN,  April 01, 2010 3:01 PM

## 2010-05-08 NOTE — Letter (Signed)
Summary: [place]  [place]   Imported By: Sherian Rein 03/18/2010 08:18:52  _____________________________________________________________________  External Attachment:    Type:   Image     Comment:   External Document

## 2010-05-08 NOTE — Progress Notes (Signed)
Summary: fell and hit head  Phone Note Other Incoming   Caller: [facility] Summary of Call: calling to inform - pt fell and hit head going to open door - raised area noted , bleeding, and 'staggering' walk. she is calling a family member -  Initial call taken by: Duard Brady LPN,  April 16, 2010 1:57 PM  Follow-up for Phone Call        i informed her that Dr. Amador Cunas out of office this afternoon. I would reccomend to ER with swelling and bleeding - may need xray I will inform Dr. Amador Cunas.  Follow-up by: Duard Brady LPN,  April 16, 2010 2:00 PM

## 2010-05-08 NOTE — Assessment & Plan Note (Signed)
Summary: RESPIRATORY CONCERNS / CHILLS// RS   Vital Signs:  Patient profile:   76 year old male Weight:      116 pounds O2 Sat:      95 % on Room air Temp:     98.3 degrees F oral BP sitting:   130 / 80  (right arm) Cuff size:   regular  Vitals Entered By: Duard Brady LPN (March 28, 2010 11:20 AM)  O2 Flow:  Room air CC: SOB  Is Patient Diabetic? No   Primary Care Alexzander Dolinger:  Gordy Savers  MD  CC:  SOB .  History of Present Illness: 29 -year-old patient who has a history of COPD, as well as systolic heart failure.  He continues to complain of shortness of breath and air hunger.  He does have home O2 that he finds beneficial at times.  His dyspnea seems independent of oxygenation.  He denies any cough, sputum production, or chest pain.  Denies any symptoms of congestive heart failure  Allergies: 1)  ! Novocain (Procaine Hcl)  Past History:  Past Medical History: Reviewed history from 08/30/2009 and no changes required. Atrial fibrillation COPD Hypertension Hypothyroidism Peptic ulcer disease Peripheral vascular disease DJD Congestive heart failure Coronary artery disease left brain Cardioembolic stroke May 2010 Osteoarthritis spinal stenosis and multilevel foraminal stenosis  Past Surgical History: Reviewed history from 08/15/2009 and no changes required. Appendectomy Inguinal herniorrhaphy Tonsillectomy Abdominal aortic aneurysm repair Lumbar laminectomy  1967, 1977, 1998  Transurethral resection of prostate   Review of Systems       The patient complains of dyspnea on exertion.  The patient denies anorexia, fever, weight loss, weight gain, vision loss, decreased hearing, hoarseness, chest pain, syncope, peripheral edema, prolonged cough, headaches, hemoptysis, abdominal pain, melena, hematochezia, severe indigestion/heartburn, hematuria, incontinence, genital sores, muscle weakness, suspicious skin lesions, transient blindness, difficulty  walking, depression, unusual weight change, abnormal bleeding, enlarged lymph nodes, angioedema, breast masses, and testicular masses.    Physical Exam  General:  thin slightly anxious, but in no distress.  Vital signs are stable Head:  Normocephalic and atraumatic without obvious abnormalities. No apparent alopecia or balding. Eyes:  No corneal or conjunctival inflammation noted. EOMI. Perrla. Funduscopic exam benign, without hemorrhages, exudates or papilledema. Vision grossly normal. Mouth:  Oral mucosa and oropharynx without lesions or exudates.  Teeth in good repair. Neck:  No deformities, masses, or tenderness noted. no neck vein distention Lungs:  Normal respiratory effort, chest expands symmetrically. Lungs are clear to auscultation, no crackles or wheezes.  O2 saturation 96 Heart:  irregular rhythm, with a controlled ventricular response Extremities:  no peripheral edema   Impression & Recommendations:  Problem # 1:  DYSPNEA (ICD-786.05)  Problem # 2:  CHRONIC SYSTOLIC HEART FAILURE (ICD-428.22)  His updated medication list for this problem includes:    Furosemide 40 Mg Tabs (Furosemide) ..... One half a tablet in the morning  monday wednesday, friday only    Coreg 3.125 Mg Tabs (Carvedilol) .Marland Kitchen..Marland Kitchen Two   tablet twice daily    Plavix 75 Mg Tabs (Clopidogrel bisulfate) ..... One daily    Digoxin 0.125 Mg Tabs (Digoxin) ..... One daily  Problem # 3:  COPD (ICD-496)  His updated medication list for this problem includes:    Symbicort 80-4.5 Mcg/act Aero (Budesonide-formoterol fumarate) .Marland Kitchen..Marland Kitchen Two puffs twice daily as needed    Ipratropium-albuterol 0.5-2.5 (3) Mg/55ml Soln (Ipratropium-albuterol) ..... Use three times daily as needed  Complete Medication List: 1)  Levothroid 50 Mcg Tabs (Levothyroxine  sodium) .Marland Kitchen.. 1 once daily 2)  Eql Omeprazole 20 Mg Tbec (Omeprazole) .Marland Kitchen.. 1 once daily 3)  Lorazepam 0.5 Mg Tabs (Lorazepam) .Marland Kitchen.. 1 two times a day as needed 4)  Nitroquick 0.4  Mg Subl (Nitroglycerin) .... Use as dir as needed 5)  Symbicort 80-4.5 Mcg/act Aero (Budesonide-formoterol fumarate) .... Two puffs twice daily as needed 6)  Ipratropium-albuterol 0.5-2.5 (3) Mg/98ml Soln (Ipratropium-albuterol) .... Use three times daily as needed 7)  Furosemide 40 Mg Tabs (Furosemide) .... One half a tablet in the morning  monday wednesday, friday only 8)  Tamsulosin Hcl 0.4 Mg Caps (Tamsulosin hcl) .... One daily 9)  Klor-con 10 10 Meq Cr-tabs (Potassium chloride) .... One daily 10)  Coreg 3.125 Mg Tabs (Carvedilol) .... Two   tablet twice daily 11)  Tramadol Hcl 50 Mg Tabs (Tramadol hcl) .... One every 6 hours as needed for pain 12)  Avodart 0.5 Mg Caps (Dutasteride) .... One daily 13)  Fentanyl 25 Mcg/hr Pt72 (Fentanyl) .... Use every 72 hours 14)  Plavix 75 Mg Tabs (Clopidogrel bisulfate) .... One daily 15)  Miralax Powd (Polyethylene glycol 3350) .... One scoop in 8 ounces of water once or twice daily for constipation 16)  Digoxin 0.125 Mg Tabs (Digoxin) .... One daily  Patient Instructions: 1)  Please schedule a follow-up appointment as needed. 2)  Limit your Sodium (Salt) to less than 2 grams a day(slightly less than 1/2 a teaspoon) to prevent fluid retention, swelling, or worsening of symptoms. 3)  It is important that you exercise regularly at least 20 minutes 5 times a week. If you develop chest pain, have severe difficulty breathing, or feel very tired , stop exercising immediately and seek medical attention.   Orders Added: 1)  Est. Patient Level III [81191]

## 2010-05-08 NOTE — Miscellaneous (Signed)
Summary: Certification and Plan of Care/[facility]  Certification and Plan of Care/[facility]   Imported By: Maryln Gottron 04/14/2010 14:49:26  _____________________________________________________________________  External Attachment:    Type:   Image     Comment:   External Document

## 2010-05-08 NOTE — Progress Notes (Signed)
Summary: Call A Nurse   Call-A-Nurse Triage Call Report Triage Record Num: [id number] Operator: Durward Mallard DiMatteis Patient Name: Cederick Broadnax Call Date & Time: 03/22/2010 10:22:39AM Patient Phone: [phone] PCP: Gordy Savers Patient Gender: Male PCP Fax : [phone] Patient DOB: [birth year] Practice Name: [facility] Reason for Call: CAller states that he has a cough and congestion; having a hard time breathing at times; has taken all the pillsthat he has to help him breath and he feels a little better; still short of breath; denies any edema; no fever; could not get a coplete history becasue talking to RN was making him short of breath; SOB started last night; instructed to call 911 and pt is refusing to go to ER; stated that he will go to office; office staff made aware and pt conferenced successfully with office; Breathing Problems Guideline; appt made for 11:15am today; will comply Protocol(s) Used: Breathing Problems Recommended Outcome per Protocol: Activate EMS 911 Reason for Outcome: Worsening breathing problems AND known cardiac (CAD, angina, HF) or chronic lung disease (asthma, COPD) NOT responding to treatment OR treatment not available Care Advice:  ~ 03/22/2010 10:32:46AM Page 1 of 1 [email or web address]

## 2010-05-08 NOTE — Progress Notes (Signed)
Summary: wants kim to return call  Phone Note Call from Patient Call back at Home Phone [phone]   Caller: Patient---triage vm Reason for Call: Acute Illness, Privacy/Consent Authorization Summary of Call: Has ? about his abx. he is confused about directions. Initial call taken by: Warnell Forester,  March 20, 2010 1:20 PM  Follow-up for Phone Call        talked with [name] and he states he didnt finish his avelox 400 - it was for5 days after discharge from hosptial-- h e lost it and only took 2 days--please advise Follow-up by: Willy Eddy, LPN,  March 20, 2010 1:57 PM    called

## 2010-05-08 NOTE — Assessment & Plan Note (Signed)
Summary: congestion     KIK   Vital Signs:  Patient profile:   77 year old male Temp:     97.7 degrees F oral CC: difficulty breathing  Is Patient Diabetic? No   Primary Care Provider:  Gordy Savers  MD  CC:  difficulty breathing .  History of Present Illness: 33 -year-old patient who has a history of advanced COPD, as well as chronic systolic heart failure.  He presents with a two-day history of worsening shortness of breath and weakness.  He has cough that has been minimally productive.  His chief complaint is shortness of breath, but he feels quite weak.  Denies any fever or chills.  He is on home oxygen therapy, as well as nebulizer treatments, which have not been helpful.  Allergies: 1)  ! Novocain (Procaine Hcl)  Past History:  Past Medical History: Reviewed history from 08/30/2009 and no changes required. Atrial fibrillation COPD Hypertension Hypothyroidism Peptic ulcer disease Peripheral vascular disease DJD Congestive heart failure Coronary artery disease left brain Cardioembolic stroke May 2010 Osteoarthritis spinal stenosis and multilevel foraminal stenosis  Past Surgical History: Reviewed history from 08/15/2009 and no changes required. Appendectomy Inguinal herniorrhaphy Tonsillectomy Abdominal aortic aneurysm repair Lumbar laminectomy  1967, 1977, 1998  Transurethral resection of prostate   Family History: Reviewed history from 08/06/2008 and no changes required. father died age 64, tuberculosis.   Mother died age 28.  Two sisters positive for dementia breast cancer, renal cancer  Social History: Reviewed history from 02/29/2008 and no changes required. Married spends considerable time at [facility]  Review of Systems       The patient complains of anorexia, dyspnea on exertion, muscle weakness, and difficulty walking.  The patient denies fever, weight loss, weight gain, vision loss, decreased hearing, hoarseness, chest pain,  syncope, peripheral edema, prolonged cough, headaches, hemoptysis, abdominal pain, melena, hematochezia, severe indigestion/heartburn, hematuria, incontinence, genital sores, suspicious skin lesions, transient blindness, depression, unusual weight change, abnormal bleeding, enlarged lymph nodes, angioedema, breast masses, and testicular masses.    Physical Exam  General:  elderly frail appears quite weak; blood pressure 110/70 Head:  Normocephalic and atraumatic without obvious abnormalities. No apparent alopecia or balding. Eyes:  No corneal or conjunctival inflammation noted. EOMI. Perrla. Funduscopic exam benign, without hemorrhages, exudates or papilledema. Vision grossly normal. Ears:  External ear exam shows no significant lesions or deformities.  Otoscopic examination reveals clear canals, tympanic membranes are intact bilaterally without bulging, retraction, inflammation or discharge. Hearing is grossly normal bilaterally. Mouth:  Oral mucosa and oropharynx without lesions or exudates.   Neck:  No deformities, masses, or tenderness noted. Chest Wall:  No deformities, masses, tenderness or gynecomastia noted. Lungs:  slightly decreased breath sounds at the left base O2 saturation 95% Heart:  Normal rate and regular rhythm. S1 and S2 normal without gallop, murmur, click, rub or other extra sounds. pulse 70 Abdomen:  Bowel sounds positive,abdomen soft and non-tender without masses, organomegaly or hernias noted. Msk:  No deformity or scoliosis noted of thoracic or lumbar spine.   Pulses:  pedal pulses absent Extremities:  no clinical edema Skin:  Intact without suspicious lesions or rashes Cervical Nodes:  No lymphadenopathy noted   Impression & Recommendations:  Problem # 1:  DYSPNEA (ICD-786.05)  Problem # 2:  CHRONIC OBSTRUCTIVE PULMONARY DISEASE, ACUTE EXACERBATION (ICD-491.21)  Problem # 3:  CORONARY ARTERY DISEASE (ICD-414.00)  His updated medication list for this problem  includes:    Nitroquick 0.4 Mg Subl (Nitroglycerin) .Marland KitchenMarland KitchenMarland KitchenMarland Kitchen  Use as dir as needed    Furosemide 40 Mg Tabs (Furosemide) ..... One half a tablet in the morning  monday wednesday, friday only    Coreg 3.125 Mg Tabs (Carvedilol) .Marland Kitchen..Marland Kitchen Two   tablet twice daily    Plavix 75 Mg Tabs (Clopidogrel bisulfate) ..... One daily  Problem # 4:  CHRONIC SYSTOLIC HEART FAILURE (ICD-428.22)  His updated medication list for this problem includes:    Furosemide 40 Mg Tabs (Furosemide) ..... One half a tablet in the morning  monday wednesday, friday only    Coreg 3.125 Mg Tabs (Carvedilol) .Marland Kitchen..Marland Kitchen Two   tablet twice daily    Plavix 75 Mg Tabs (Clopidogrel bisulfate) ..... One daily    Digoxin 0.125 Mg Tabs (Digoxin) ..... One daily will transport the patient to the hospital via private auto.  Will need the ED evaluation to include chest x-ray screening labs including BNP and digoxin level  Complete Medication List: 1)  Levothroid 50 Mcg Tabs (Levothyroxine sodium) .Marland Kitchen.. 1 once daily 2)  Eql Omeprazole 20 Mg Tbec (Omeprazole) .Marland Kitchen.. 1 once daily 3)  Lorazepam 0.5 Mg Tabs (Lorazepam) .Marland Kitchen.. 1 two times a day as needed 4)  Nitroquick 0.4 Mg Subl (Nitroglycerin) .... Use as dir as needed 5)  Symbicort 80-4.5 Mcg/act Aero (Budesonide-formoterol fumarate) .... Two puffs twice daily as needed 6)  Ipratropium-albuterol 0.5-2.5 (3) Mg/11ml Soln (Ipratropium-albuterol) .... Use three times daily as needed 7)  Furosemide 40 Mg Tabs (Furosemide) .... One half a tablet in the morning  monday wednesday, friday only 8)  Tamsulosin Hcl 0.4 Mg Caps (Tamsulosin hcl) .... One daily 9)  Klor-con 10 10 Meq Cr-tabs (Potassium chloride) .... One daily 10)  Coreg 3.125 Mg Tabs (Carvedilol) .... Two   tablet twice daily 11)  Tramadol Hcl 50 Mg Tabs (Tramadol hcl) .... One every 6 hours as needed for pain 12)  Avodart 0.5 Mg Caps (Dutasteride) .... One daily 13)  Fentanyl 25 Mcg/hr Pt72 (Fentanyl) .... Use every 72 hours 14)  Plavix 75 Mg Tabs  (Clopidogrel bisulfate) .... One daily 15)  Miralax Powd (Polyethylene glycol 3350) .... One scoop in 8 ounces of water once or twice daily for constipation 16)  Digoxin 0.125 Mg Tabs (Digoxin) .... One daily  Patient Instructions: 1)  patient will be transported to the ED for further evaluation and possible hospital admission   Orders Added: 1)  Est. Patient Level IV [04540]

## 2010-05-14 NOTE — Progress Notes (Signed)
Summary: nausea  Phone Note Call from Patient   Caller: Patient Call For: Gordy Savers  MD Summary of Call: Pt needs Rx for nausea sent to [facility] ([place]) today, please. Initial call taken by: [facility] CMA AAMA,  May 06, 2010 4:13 PM  Follow-up for Phone Call        generic Phenergan 25 mg, number 30, 1 every 6 hours as needed for nausea Follow-up by: Gordy Savers  MD,  May 06, 2010 4:27 PM  Additional Follow-up for Phone Call Additional follow up Details #1::        [facility] rx [facility] promethazine 25mg  [facility] - pt called. kik Additional Follow-up by: Duard Brady LPN,  May 06, 2010 4:41 PM

## 2010-05-14 NOTE — Assessment & Plan Note (Signed)
Summary: post hosp fup//cm   Vital Signs:  Patient profile:   77 year old male Weight:      117 pounds Temp:     97.8 degrees F oral BP sitting:   130 / 78  (right arm) Cuff size:   regular  Vitals Entered By: Duard Brady LPN (May 05, 2010 1:14 PM) CC: post hospital   c/o congestion Is Patient Diabetic? No   Primary Care Provider:  Gordy Savers  MD  CC:  post hospital   c/o congestion.  History of Present Illness: 44 -year-old patient who is seen   Post hospital discharge.  he was admitted after several episodes of nausea, vomiting, and abdominal pain.  His GI symptoms resolved after treatment of constipation.  He remains weak, but no cardiac, pulmonary, or GI symptoms.  His main complaint is urinary frequency.  He does have a history of BPH and is status post TURP in the past.  He is on Avodart and Flomax.  He has benefited from an increase b.i.d. regimen of Flomax in the past.  He states that he had nocturia x 9.  Last night. Hospital records were reviewed  Allergies: 1)  ! Novocain (Procaine Hcl)  Past History:  Past Medical History: Reviewed history from 08/30/2009 and no changes required. Atrial fibrillation COPD Hypertension Hypothyroidism Peptic ulcer disease Peripheral vascular disease DJD Congestive heart failure Coronary artery disease left brain Cardioembolic stroke May 2010 Osteoarthritis spinal stenosis and multilevel foraminal stenosis  Past Surgical History: Reviewed history from 08/15/2009 and no changes required. Appendectomy Inguinal herniorrhaphy Tonsillectomy Abdominal aortic aneurysm repair Lumbar laminectomy  1967, 1977, 1998  Transurethral resection of prostate   Family History: Reviewed history from 08/06/2008 and no changes required. father died age 87, tuberculosis.   Mother died age 60.  Two sisters positive for dementia breast cancer, renal cancer  Social History: Reviewed history from 02/29/2008 and no changes  required. Married spends considerable time at [facility]  Review of Systems       The patient complains of dyspnea on exertion, muscle weakness, and difficulty walking.  The patient denies anorexia, fever, weight loss, weight gain, vision loss, decreased hearing, hoarseness, chest pain, syncope, peripheral edema, prolonged cough, headaches, hemoptysis, abdominal pain, melena, hematochezia, severe indigestion/heartburn, hematuria, incontinence, genital sores, suspicious skin lesions, transient blindness, depression, unusual weight change, abnormal bleeding, enlarged lymph nodes, angioedema, breast masses, and testicular masses.    Physical Exam  General:  Well-developed,well-nourished,in no acute distress; alert,appropriate and cooperative throughout examination Head:  Normocephalic and atraumatic without obvious abnormalities. No apparent alopecia or balding. Eyes:  No corneal or conjunctival inflammation noted. EOMI. Perrla. Funduscopic exam benign, without hemorrhages, exudates or papilledema. Vision grossly normal. Mouth:  Oral mucosa and oropharynx without lesions or exudates.  Teeth in good repair. Neck:  No deformities, masses, or tenderness noted. Lungs:  Normal respiratory effort, chest expands symmetrically. Lungs are clear to auscultation, no crackles or wheezes. Heart:  controlled ventricular response Abdomen:  Bowel sounds positive,abdomen soft and non-tender without masses, organomegaly or hernias noted. Msk:  No deformity or scoliosis noted of thoracic or lumbar spine.   Extremities:  no edema   Impression & Recommendations:  Problem # 1:  URINARY FREQUENCY (ICD-788.41)  His updated medication list for this problem includes:    Tamsulosin Hcl 0.4 Mg Caps (Tamsulosin hcl) ..... One daily    Avodart 0.5 Mg Caps (Dutasteride) ..... One daily will increase  tamsulosin to b.i.d., short-term  His updated medication list  for this problem includes:    Tamsulosin Hcl 0.4 Mg  Caps (Tamsulosin hcl) ..... One daily    Avodart 0.5 Mg Caps (Dutasteride) ..... One daily  Problem # 2:  BENIGN PROSTATIC HYPERTROPHY, WITH URINARY OBSTRUCTION (ICD-600.01)  His updated medication list for this problem includes:    Tamsulosin Hcl 0.4 Mg Caps (Tamsulosin hcl) ..... One daily    Avodart 0.5 Mg Caps (Dutasteride) ..... One daily  His updated medication list for this problem includes:    Tamsulosin Hcl 0.4 Mg Caps (Tamsulosin hcl) ..... One daily    Avodart 0.5 Mg Caps (Dutasteride) ..... One daily  Problem # 3:  NAUSEA (ICD-787.02) resolved  Problem # 4:  CHRONIC SYSTOLIC HEART FAILURE (ICD-428.22)  His updated medication list for this problem includes:    Furosemide 40 Mg Tabs (Furosemide) ..... One half a tablet in the morning  monday wednesday, friday only    Coreg 3.125 Mg Tabs (Carvedilol) .Marland Kitchen..Marland Kitchen Two   tablet twice daily    Plavix 75 Mg Tabs (Clopidogrel bisulfate) ..... One daily    Digoxin 0.125 Mg Tabs (Digoxin) ..... One daily stable  His updated medication list for this problem includes:    Furosemide 40 Mg Tabs (Furosemide) ..... One half a tablet in the morning  monday wednesday, friday only    Coreg 3.125 Mg Tabs (Carvedilol) .Marland Kitchen..Marland Kitchen Two   tablet twice daily    Plavix 75 Mg Tabs (Clopidogrel bisulfate) ..... One daily    Digoxin 0.125 Mg Tabs (Digoxin) ..... One daily  Complete Medication List: 1)  Levothroid 50 Mcg Tabs (Levothyroxine sodium) .Marland Kitchen.. 1 once daily 2)  Eql Omeprazole 20 Mg Tbec (Omeprazole) .Marland Kitchen.. 1 once daily 3)  Lorazepam 0.5 Mg Tabs (Lorazepam) .Marland Kitchen.. 1 two times a day as needed 4)  Nitroquick 0.4 Mg Subl (Nitroglycerin) .... Use as dir as needed 5)  Symbicort 80-4.5 Mcg/act Aero (Budesonide-formoterol fumarate) .... Two puffs twice daily as needed 6)  Ipratropium-albuterol 0.5-2.5 (3) Mg/68ml Soln (Ipratropium-albuterol) .... Use three times daily as needed 7)  Furosemide 40 Mg Tabs (Furosemide) .... One half a tablet in the morning  monday  wednesday, friday only 8)  Tamsulosin Hcl 0.4 Mg Caps (Tamsulosin hcl) .... One daily 9)  Klor-con 10 10 Meq Cr-tabs (Potassium chloride) .... One daily 10)  Coreg 3.125 Mg Tabs (Carvedilol) .... Two   tablet twice daily 11)  Tramadol Hcl 50 Mg Tabs (Tramadol hcl) .... One every 6 hours as needed for pain 12)  Avodart 0.5 Mg Caps (Dutasteride) .... One daily 13)  Fentanyl 25 Mcg/hr Pt72 (Fentanyl) .... Use every 72 hours 14)  Plavix 75 Mg Tabs (Clopidogrel bisulfate) .... One daily 15)  Miralax Powd (Polyethylene glycol 3350) .... One scoop in 8 ounces of water once or twice daily for constipation 16)  Digoxin 0.125 Mg Tabs (Digoxin) .... One daily  Patient Instructions: 1)  Please schedule a follow-up appointment in 1 month. 2)  Limit your Sodium (Salt). 3)  It is important that you exercise regularly at least 20 minutes 5 times a week. If you develop chest pain, have severe difficulty breathing, or feel very tired , stop exercising immediately and seek medical attention.   Orders Added: 1)  Est. Patient Level IV [16109]

## 2010-05-14 NOTE — Progress Notes (Signed)
Summary: order needed for PT  Phone Note From Other Clinic   Caller: [facility]  Darl Pikes Call For: Dr. Kirtland Bouchard Summary of Call: PT and needs continued orders please. [phone] Initial call taken by: [name] CMA AAMA,  May 06, 2010 12:39 PM  Follow-up for Phone Call        called and gave order to continue. KIK Follow-up by: Duard Brady LPN,  May 06, 2010 12:47 PM

## 2010-05-15 ENCOUNTER — Other Ambulatory Visit: Payer: Self-pay | Admitting: Internal Medicine

## 2010-05-15 DIAGNOSIS — R609 Edema, unspecified: Secondary | ICD-10-CM

## 2010-05-20 ENCOUNTER — Encounter: Payer: Self-pay | Admitting: Internal Medicine

## 2010-05-21 ENCOUNTER — Encounter: Payer: Self-pay | Admitting: Internal Medicine

## 2010-05-21 ENCOUNTER — Ambulatory Visit (INDEPENDENT_AMBULATORY_CARE_PROVIDER_SITE_OTHER): Payer: Medicare Other | Admitting: Internal Medicine

## 2010-05-21 DIAGNOSIS — M25529 Pain in unspecified elbow: Secondary | ICD-10-CM

## 2010-05-21 DIAGNOSIS — I4891 Unspecified atrial fibrillation: Secondary | ICD-10-CM

## 2010-05-21 DIAGNOSIS — J441 Chronic obstructive pulmonary disease with (acute) exacerbation: Secondary | ICD-10-CM

## 2010-05-21 DIAGNOSIS — I5022 Chronic systolic (congestive) heart failure: Secondary | ICD-10-CM

## 2010-05-21 DIAGNOSIS — M25522 Pain in left elbow: Secondary | ICD-10-CM

## 2010-05-21 MED ORDER — TAMSULOSIN HCL 0.4 MG PO CAPS: 0.4000 mg | ORAL_CAPSULE | Freq: Every day | ORAL | Status: DC

## 2010-05-21 MED ORDER — DUTASTERIDE-TAMSULOSIN HCL 0.5-0.4 MG PO CAPS: 1.0000 mg | ORAL_CAPSULE | Freq: Every day | ORAL | Status: DC

## 2010-05-21 NOTE — Patient Instructions (Signed)
Orthopedic followup as scheduled  Call or return to clinic prn if these symptoms worsen or fail to improve as anticipated.  

## 2010-05-21 NOTE — Progress Notes (Signed)
  Subjective:    Patient ID: [name], male    DOB: Aug 29, 1917, 77 y.o.   MRN: [id number]  HPI  77 year old patient who is seen today for follow-up.  He has a history of advanced COPD, as well as chronic systolic heart failure.  Complaints today include cough.  Cough is nonproductive.  He has had no fever, shortness of breath or wheezing.  In general, he is doing remarkably well. His main complaint is left shoulder pain.  He states this was fractured in 2009, and he underwent surgery at [facility] he feels that the elbow has become dislocated and more deformed.  He has had increasing pain, especially with attempts at full extension.  He wishes to see a local orthopedic physician    Review of Systems  Constitutional: Negative for fever, chills, appetite change and fatigue.  HENT: Negative for hearing loss, ear pain, congestion, sore throat, trouble swallowing, neck stiffness, dental problem, voice change and tinnitus.   Eyes: Negative for pain, discharge and visual disturbance.  Respiratory: Positive for cough. Negative for chest tightness, wheezing and stridor.   Cardiovascular: Negative for chest pain, palpitations and leg swelling.  Gastrointestinal: Negative for nausea, vomiting, abdominal pain, diarrhea, constipation, blood in stool and abdominal distention.  Genitourinary: Negative for urgency, hematuria, flank pain, discharge, difficulty urinating and genital sores.  Musculoskeletal: Positive for joint swelling. Negative for myalgias, back pain, arthralgias and gait problem.  Skin: Negative for rash.  Neurological: Negative for dizziness, syncope, speech difficulty, weakness, numbness and headaches.  Hematological: Negative for adenopathy. Does not bruise/bleed easily.  Psychiatric/Behavioral: Negative for behavioral problems and dysphoric mood. The patient is not nervous/anxious.        Objective:   Physical Exam  Constitutional: He is oriented to person, place, and  time. He appears well-developed.  HENT:  Head: Normocephalic.  Right Ear: External ear normal.  Left Ear: External ear normal.  Eyes: Conjunctivae and EOM are normal.  Neck: Normal range of motion.  Cardiovascular: Normal rate and normal heart sounds.        Irregular rhythm, with a controlled ventricular response  Pulmonary/Chest: Effort normal and breath sounds normal. No respiratory distress. He has no wheezes. He has no rales.       O2 saturation 97%  Abdominal: Bowel sounds are normal.  Musculoskeletal: Normal range of motion. He exhibits no edema and no tenderness.  Neurological: He is alert and oriented to person, place, and time.  Psychiatric: He has a normal mood and affect. His behavior is normal.          Assessment & Plan:  Advanced COPD-relatively stable Left elbow pain status post fracture.  Will set up for orthopedic evaluation Chronic atrial Fibrillation Chronic systolic heart failure

## 2010-05-24 NOTE — H&P (Signed)
NAMEKHAM, ZUCKERMAN NO.:  [id number]  MEDICAL RECORD NO.:  [id number]          PATIENT TYPE:  INP  LOCATION:  0105                         FACILITY:  [facility]  PHYSICIAN:  Massie Maroon, MD        DATE OF BIRTH:  21-Jul-1917  DATE OF ADMISSION:  04/22/2010 DATE OF DISCHARGE:                             HISTORY & PHYSICAL   CHIEF COMPLAINT:  Nausea and vomiting.  HISTORY OF PRESENT ILLNESS:  This is a 77 year old male with complaints of nausea, vomiting since yesterday x4.  There was no blood in his emesis.  The patient had some achiness in the upper abdomen and notes that his last bowel movement did occur last night when he had diarrhea x1 which was loose stool.  However, in general, he has felt constipated. The patient denies any bright red blood per rectum or black stool.  The patient was brought to the ED for evaluation and had a CT abdomen and pelvis which showed colonic dilation related to possibly fecal impaction of the rectum and slightly prominent wall enhancement of the small bowel in the pelvis, could be a manifestation of mesenteric ischemia. However, no large vessel occlusion was identified and his lactic acid was normal.  His cardiac markers were also normal.  The patient was not noted to be anemic and had no white count.  The patient will be admitted for workup of nausea, vomiting, diarrhea.  PAST MEDICAL HISTORY: 1. Atrial fibrillation. 2. COPD, on home O2. 3. Hypertension. 4. PVD. 5. CHF. 6. CAD. 7. Left brain cardioembolic stroke, May 2010. 8. Peptic ulcer disease. 9. Degenerative joint disease. 10.Osteoarthritis. 11.Spinal stenosis and multiple foraminal stenosis.  PAST SURGICAL HISTORY: 1. Appendectomy. 2. Inguinal herniorrhaphy 3. Tonsillectomy 4. Abdominal aortic aneurysm repair. 5. Lumbar laminectomy in 1967, 1977, 1998. 6. TURP. 7. Cataract surgery.  SOCIAL HISTORY:  The patient does not smoke or drink.  He quit smoking 60  years ago.  He worked as a Printmaker.  He is married and spent considerable time at [facility].  FAMILY HISTORY:  Father died at age 80 of tuberculosis.  Mother died at age 44 from a heart attack.  The patient has 2 sisters positive for dementia, breast cancer and renal cancer.  ALLERGIES: 1. NOVOCAINE. 2. COUMADIN - weight loss.  MEDICATIONS: 1. Levothyroxine 50 mcg p.o. daily. 2. Omeprazole 20 mg p.o. daily. 3. Lorazepam 0.5 mg p.o. b.i.d. p.r.n. 4. Sublingual nitroglycerin 0.4 mg p.o. q. 5 minutes p.r.n. chest     pain. 5. Symbicort 80 - 4.5 two puffs p.o. b.i.d. 6. Atrovent 1 neb t.i.d. as needed. 7. Furosemide 40 mg 1/2 p.o. q.a.m. Monday, Wednesday, Friday only. 8. Tamsulosin or Flomax 0.4 mg p.o. daily. 9. Potassium chloride 10 mEq p.o. daily. 10.Carvedilol 3.125 mg p.o. b.i.d. 11.Tramadol 50 mg p.o. q.6 h p.r.n. pain. 12.Avodart 0.5 mg p.o. daily. 13.Fentanyl 25 mcg topically q.72 h. 14.Plavix 75 mg p.o. daily. 15.MiraLax 17 g p.o. daily. 16.Digoxin 0.125 mg p.o. daily  REVIEW OF SYSTEMS:  Negative for all 10 organ systems except for pertinent positives stated above.  PHYSICAL EXAMINATION:  VITAL SIGNS:  Temperature 97.5, pulse 92,  blood pressure 139/86, pulse ox 94% on room air. HEENT:  Anicteric, EOMI.  No nystagmus.  Pupils 1.5 mm, symmetric. Direct and consensual corneal reflexes intact.  Mucous membranes dry. NECK:  No JVD, no bruit. HEART:  Irregularly irregular.  S1, S2.  No murmurs, gallops or rubs. LUNGS:  Clear to auscultation bilaterally. ABDOMEN:  Soft, nontender, nondistended.  Positive bowel sounds. EXTREMITIES: No cyanosis, clubbing or edema. SKIN:  No rashes. LYMPH NODES:  No adenopathy. NEURO EXAM:  Nonfocal.  Cranial nerves II through XII intact.  Reflexes 2+, symmetric, diffuse with downgoing toes bilaterally, motor strength 5/5 in all 4 extremities, pinprick intact.  LABORATORY DATA:  Lactic acid 0.9.  Sodium 134, potassium 4.4, BUN  11, creatinine 0.59, AST 22, ALT 16, alk phos 63, total bilirubin 0.8, troponin-I less than 0.05.  WBC 9.5, hemoglobin 13.0, platelet count is 169.  Urinalysis negative.  EKG, AFib at 95, normal axis, poor R-wave progression, Q waves in V1 and V2.  CT of the abdomen and pelvis shows interval improvement of pleural effusions with resolution of left lower airspace disease, new colonic dilation may be related to fecal impaction at the rectum.  There is prominent wall enhancement of small bowel in the pelvis which could be a manifestation of mesenteric ischemia but no large vessel occlusion is identified.  Pelvic ascites without focal extraluminal fluid or air collection.  Stable postsurgical changes of the lumbar spine and aorta. Chest x-ray, stable exam, chronic lung disease.  ASSESSMENT: 1. Nausea, vomiting, diarrhea. 2. Atrial fibrillation. 3. Hypertension. 4. Peripheral vascular disease. 5. Coronary artery disease. 6. Congestive heart failure. 7. Hypothyroid. 8. Deep venous prophylaxis.  SCDs.  PLAN:  The patient placed on telemetry.  We will check cardiac markers. We will check a TSH.  Check a digoxin level.  Zofran for nausea.  Check stool studies for fecal leukocytes, culture C diff, ova and parasites due to complaint of diarrhea.  Imodium if necessary for diarrhea.     Massie Maroon, MD     JYK/MEDQ  D:  04/23/2010  T:  04/23/2010  Job:  [id number]  cc:   Gordy Savers, MD [address] [place] [address]  Electronically Signed by Pearson Grippe MD on 05/24/2010 07:14:39 AM

## 2010-06-02 ENCOUNTER — Other Ambulatory Visit: Payer: Self-pay | Admitting: Internal Medicine

## 2010-06-16 LAB — URINALYSIS, ROUTINE W REFLEX MICROSCOPIC
Bilirubin Urine: NEGATIVE
Glucose, UA: NEGATIVE mg/dL
Ketones, ur: NEGATIVE mg/dL
Nitrite: NEGATIVE
pH: 7.5 (ref 5.0–8.0)

## 2010-06-16 LAB — DIFFERENTIAL
Basophils Absolute: 0 10*3/uL (ref 0.0–0.1)
Basophils Absolute: 0 10*3/uL (ref 0.0–0.1)
Basophils Relative: 1 % (ref 0–1)
Eosinophils Absolute: 0 10*3/uL (ref 0.0–0.7)
Eosinophils Relative: 2 % (ref 0–5)
Eosinophils Relative: 0 % (ref 0–5)
Lymphocytes Relative: 7 % — ABNORMAL LOW (ref 12–46)
Monocytes Absolute: 0.6 10*3/uL (ref 0.1–1.0)
Monocytes Absolute: 0.5 10*3/uL (ref 0.1–1.0)
Neutro Abs: 4 10*3/uL (ref 1.7–7.7)
Neutrophils Relative %: 68 % (ref 43–77)

## 2010-06-16 LAB — POCT I-STAT, CHEM 8
BUN: 22 mg/dL (ref 6–23)
Calcium, Ion: 1.14 mmol/L (ref 1.12–1.32)
Chloride: 99 mEq/L (ref 96–112)
Creatinine, Ser: 0.8 mg/dL (ref 0.4–1.5)
Glucose, Bld: 134 mg/dL — ABNORMAL HIGH (ref 70–99)

## 2010-06-16 LAB — BASIC METABOLIC PANEL
CO2: 31 mEq/L (ref 19–32)
Calcium: 8.8 mg/dL (ref 8.4–10.5)
Chloride: 100 mEq/L (ref 96–112)
Creatinine, Ser: 0.55 mg/dL (ref 0.4–1.5)
Creatinine, Ser: 0.59 mg/dL (ref 0.4–1.5)
GFR calc Af Amer: >60 mL/min (ref >60)
GFR calc Af Amer: >60 mL/min (ref >60)
GFR calc non Af Amer: >60 mL/min (ref >60)
GFR calc non Af Amer: >60 mL/min (ref >60)
Sodium: 135 mEq/L (ref 135–145)

## 2010-06-16 LAB — CBC
HCT: 36.1 % — ABNORMAL LOW (ref 39.0–52.0)
HCT: 40.3 % (ref 39.0–52.0)
Hemoglobin: 11.7 g/dL — ABNORMAL LOW (ref 13.0–17.0)
Hemoglobin: 13.1 g/dL (ref 13.0–17.0)
Hemoglobin: 11.8 g/dL — ABNORMAL LOW (ref 13.0–17.0)
MCH: 27.8 pg (ref 26.0–34.0)
MCH: 28.2 pg (ref 26.0–34.0)
MCH: 28 pg (ref 26.0–34.0)
MCH: 28.2 pg (ref 26.0–34.0)
MCH: 28.6 pg (ref 26.0–34.0)
MCHC: 32.4 g/dL (ref 30.0–36.0)
MCV: 87 fL (ref 78.0–100.0)
MCV: 86.7 fL (ref 78.0–100.0)
MCV: 86.7 fL (ref 78.0–100.0)
Platelets: 168 10*3/uL (ref 150–400)
Platelets: 128 10*3/uL — ABNORMAL LOW (ref 150–400)
RBC: 4.15 MIL/uL — ABNORMAL LOW (ref 4.22–5.81)
RBC: 4.72 MIL/uL (ref 4.22–5.81)
RBC: 4.21 MIL/uL — ABNORMAL LOW (ref 4.22–5.81)
RDW: 14.1 % (ref 11.5–15.5)
RDW: 14.1 % (ref 11.5–15.5)
WBC: 5.9 10*3/uL (ref 4.0–10.5)
WBC: 9.9 10*3/uL (ref 4.0–10.5)

## 2010-06-16 LAB — COMPREHENSIVE METABOLIC PANEL
Albumin: 2.7 g/dL — ABNORMAL LOW (ref 3.5–5.2)
BUN: 13 mg/dL (ref 6–23)
Creatinine, Ser: 0.6 mg/dL (ref 0.4–1.5)
Total Bilirubin: 0.6 mg/dL (ref 0.3–1.2)
Total Protein: 5.2 g/dL — ABNORMAL LOW (ref 6.0–8.3)

## 2010-06-16 LAB — URINE CULTURE
Colony Count: NO GROWTH
Culture  Setup Time: 201112111157

## 2010-06-16 LAB — MAGNESIUM: Magnesium: 2.1 mg/dL (ref 1.5–2.5)

## 2010-06-16 LAB — POCT CARDIAC MARKERS
Myoglobin, poc: 211 ng/mL (ref 12–200)
Troponin i, poc: <0.05 ng/mL (ref 0.00–0.09)

## 2010-06-16 LAB — CULTURE, BLOOD (ROUTINE X 2)
Culture  Setup Time: 201112111200
Culture: NO GROWTH

## 2010-06-16 LAB — TSH: TSH: 3.811 u[IU]/mL (ref 0.350–4.500)

## 2010-06-19 LAB — DIFFERENTIAL
Eosinophils Absolute: 0.1 10*3/uL (ref 0.0–0.7)
Eosinophils Relative: 1 % (ref 0–5)
Lymphs Abs: 1.4 10*3/uL (ref 0.7–4.0)
Monocytes Absolute: 0.6 10*3/uL (ref 0.1–1.0)
Monocytes Relative: 9 % (ref 3–12)

## 2010-06-19 LAB — CBC
HCT: 35.9 % — ABNORMAL LOW (ref 39.0–52.0)
Hemoglobin: 11.8 g/dL — ABNORMAL LOW (ref 13.0–17.0)
MCH: 27.6 pg (ref 26.0–34.0)
MCV: 83.9 fL (ref 78.0–100.0)
Platelets: 166 10*3/uL (ref 150–400)
RBC: 4.28 MIL/uL (ref 4.22–5.81)

## 2010-06-19 LAB — POCT CARDIAC MARKERS
CKMB, poc: 1.1 ng/mL (ref 1.0–8.0)
CKMB, poc: 1.1 ng/mL (ref 1.0–8.0)
Myoglobin, poc: 41.9 ng/mL (ref 12–200)
Myoglobin, poc: 56.9 ng/mL (ref 12–200)
Troponin i, poc: <0.05 ng/mL (ref 0.00–0.09)
Troponin i, poc: <0.05 ng/mL (ref 0.00–0.09)

## 2010-06-19 LAB — BASIC METABOLIC PANEL
CO2: 30 mEq/L (ref 19–32)
Chloride: 94 mEq/L — ABNORMAL LOW (ref 96–112)
Creatinine, Ser: 0.6 mg/dL (ref 0.4–1.5)
GFR calc Af Amer: >60 mL/min (ref >60)
Potassium: 4 mEq/L (ref 3.5–5.1)

## 2010-06-19 LAB — POCT I-STAT 3, ART BLOOD GAS (G3+)
Acid-Base Excess: 4 mmol/L — ABNORMAL HIGH (ref 0.0–2.0)
Bicarbonate: 29.7 mEq/L — ABNORMAL HIGH (ref 20.0–24.0)
O2 Saturation: 98 %
TCO2: 31 mmol/L (ref 0–100)
pCO2 arterial: 46.1 mmHg — ABNORMAL HIGH (ref 35.0–45.0)
pH, Arterial: 7.416 (ref 7.350–7.450)
pO2, Arterial: 106 mmHg — ABNORMAL HIGH (ref 80.0–100.0)

## 2010-06-22 LAB — CARDIAC PANEL(CRET KIN+CKTOT+MB+TROPI)
CK, MB: 6.7 ng/mL (ref 0.3–4.0)
CK, MB: 7.3 ng/mL (ref 0.3–4.0)
Relative Index: 3.4 — ABNORMAL HIGH (ref 0.0–2.5)
Relative Index: 4.2 — ABNORMAL HIGH (ref 0.0–2.5)
Relative Index: INVALID (ref 0.0–2.5)
Total CK: 117 U/L (ref 7–232)
Total CK: 152 U/L (ref 7–232)
Total CK: 187 U/L (ref 7–232)
Total CK: 216 U/L (ref 7–232)
Troponin I: 0.03 ng/mL (ref 0.00–0.06)
Troponin I: 0.03 ng/mL (ref 0.00–0.06)
Troponin I: 0.04 ng/mL (ref 0.00–0.06)

## 2010-06-22 LAB — BASIC METABOLIC PANEL
BUN: 15 mg/dL (ref 6–23)
CO2: 30 mEq/L (ref 19–32)
Calcium: 8.2 mg/dL — ABNORMAL LOW (ref 8.4–10.5)
Calcium: 8.4 mg/dL (ref 8.4–10.5)
Chloride: 99 mEq/L (ref 96–112)
Creatinine, Ser: 0.56 mg/dL (ref 0.4–1.5)
Creatinine, Ser: 0.67 mg/dL (ref 0.4–1.5)
Creatinine, Ser: 0.69 mg/dL (ref 0.4–1.5)
GFR calc Af Amer: >60 mL/min (ref >60)
GFR calc non Af Amer: >60 mL/min (ref >60)
GFR calc non Af Amer: >60 mL/min (ref >60)
Glucose, Bld: 133 mg/dL — ABNORMAL HIGH (ref 70–99)
Glucose, Bld: 96 mg/dL (ref 70–99)
Sodium: 134 mEq/L — ABNORMAL LOW (ref 135–145)

## 2010-06-22 LAB — COMPREHENSIVE METABOLIC PANEL
ALT: 21 U/L (ref 0–53)
AST: 29 U/L (ref 0–37)
Alkaline Phosphatase: 45 U/L (ref 39–117)
CO2: 25 mEq/L (ref 19–32)
Calcium: 8.1 mg/dL — ABNORMAL LOW (ref 8.4–10.5)
GFR calc Af Amer: >60 mL/min (ref >60)
GFR calc non Af Amer: >60 mL/min (ref >60)
Potassium: 3.9 mEq/L (ref 3.5–5.1)
Sodium: 130 mEq/L — ABNORMAL LOW (ref 135–145)
Total Protein: 5.2 g/dL — ABNORMAL LOW (ref 6.0–8.3)

## 2010-06-22 LAB — URINALYSIS, ROUTINE W REFLEX MICROSCOPIC
Bilirubin Urine: NEGATIVE
Ketones, ur: NEGATIVE mg/dL
Nitrite: NEGATIVE
Protein, ur: NEGATIVE mg/dL

## 2010-06-22 LAB — DIFFERENTIAL
Basophils Absolute: 0 10*3/uL (ref 0.0–0.1)
Basophils Relative: 0 % (ref 0–1)
Eosinophils Absolute: 0 10*3/uL (ref 0.0–0.7)
Eosinophils Relative: 1 % (ref 0–5)
Lymphs Abs: 1.5 10*3/uL (ref 0.7–4.0)
Neutrophils Relative %: 67 % (ref 43–77)

## 2010-06-22 LAB — PROTIME-INR
INR: 4.25 — ABNORMAL HIGH (ref 0.00–1.49)
Prothrombin Time: 16.2 seconds — ABNORMAL HIGH (ref 11.6–15.2)
Prothrombin Time: 33.5 seconds — ABNORMAL HIGH (ref 11.6–15.2)
Prothrombin Time: 40.6 seconds — ABNORMAL HIGH (ref 11.6–15.2)

## 2010-06-22 LAB — CBC
HCT: 40.3 % (ref 39.0–52.0)
MCHC: 33.8 g/dL (ref 30.0–36.0)
MCHC: 33.8 g/dL (ref 30.0–36.0)
MCV: 84.4 fL (ref 78.0–100.0)
MCV: 85.5 fL (ref 78.0–100.0)
Platelets: 125 10*3/uL — ABNORMAL LOW (ref 150–400)
Platelets: 137 10*3/uL — ABNORMAL LOW (ref 150–400)
RBC: 4.58 MIL/uL (ref 4.22–5.81)
RDW: 14.5 % (ref 11.5–15.5)
WBC: 6 10*3/uL (ref 4.0–10.5)
WBC: 7.5 10*3/uL (ref 4.0–10.5)

## 2010-06-22 LAB — POCT CARDIAC MARKERS
CKMB, poc: 3.5 ng/mL (ref 1.0–8.0)
Myoglobin, poc: 118 ng/mL (ref 12–200)
Troponin i, poc: <0.05 ng/mL (ref 0.00–0.09)

## 2010-06-22 LAB — CULTURE, BLOOD (ROUTINE X 2)
Culture: NO GROWTH
Culture: NO GROWTH

## 2010-06-25 LAB — DIFFERENTIAL
Basophils Absolute: 0 K/uL (ref 0.0–0.1)
Basophils Relative: 1 % (ref 0–1)
Eosinophils Absolute: 0 K/uL (ref 0.0–0.7)
Eosinophils Relative: 0 % (ref 0–5)
Lymphocytes Relative: 38 % (ref 12–46)
Lymphs Abs: 1.6 10*3/uL (ref 0.7–4.0)
Monocytes Absolute: 0.6 K/uL (ref 0.1–1.0)
Monocytes Relative: 14 % — ABNORMAL HIGH (ref 3–12)
Neutro Abs: 2 10*3/uL (ref 1.7–7.7)
Neutrophils Relative %: 48 % (ref 43–77)

## 2010-06-25 LAB — LIPID PANEL
HDL: 34 mg/dL — ABNORMAL LOW (ref >39)
Total CHOL/HDL Ratio: 3.7 RATIO
VLDL: 10 mg/dL (ref 0–40)

## 2010-06-25 LAB — CBC
HCT: 38.1 % — ABNORMAL LOW (ref 39.0–52.0)
Hemoglobin: 12.5 g/dL — ABNORMAL LOW (ref 13.0–17.0)
Hemoglobin: 12.6 g/dL — ABNORMAL LOW (ref 13.0–17.0)
Hemoglobin: 13.1 g/dL (ref 13.0–17.0)
MCHC: 34 g/dL (ref 30.0–36.0)
MCHC: 34.3 g/dL (ref 30.0–36.0)
MCV: 86 fL (ref 78.0–100.0)
Platelets: 125 K/uL — ABNORMAL LOW (ref 150–400)
RBC: 4.23 MIL/uL (ref 4.22–5.81)
RBC: 4.24 MIL/uL (ref 4.22–5.81)
RBC: 4.43 MIL/uL (ref 4.22–5.81)
RDW: 13.9 % (ref 11.5–15.5)
WBC: 3.9 10*3/uL — ABNORMAL LOW (ref 4.0–10.5)
WBC: 3.9 10*3/uL — ABNORMAL LOW (ref 4.0–10.5)
WBC: 4.2 10*3/uL (ref 4.0–10.5)

## 2010-06-25 LAB — BASIC METABOLIC PANEL WITH GFR
BUN: 19 mg/dL (ref 6–23)
CO2: 28 meq/L (ref 19–32)
Glucose, Bld: 94 mg/dL (ref 70–99)
Potassium: 4.3 meq/L (ref 3.5–5.1)
Sodium: 132 meq/L — ABNORMAL LOW (ref 135–145)

## 2010-06-25 LAB — PROTIME-INR
INR: 1.82 — ABNORMAL HIGH (ref 0.00–1.49)
INR: 1.82 — ABNORMAL HIGH (ref 0.00–1.49)
INR: 2.12 — ABNORMAL HIGH (ref 0.00–1.49)
Prothrombin Time: 20.9 seconds — ABNORMAL HIGH (ref 11.6–15.2)
Prothrombin Time: 20.9 seconds — ABNORMAL HIGH (ref 11.6–15.2)

## 2010-06-25 LAB — BASIC METABOLIC PANEL
Calcium: 8.1 mg/dL — ABNORMAL LOW (ref 8.4–10.5)
Chloride: 98 mEq/L (ref 96–112)
Creatinine, Ser: 0.73 mg/dL (ref 0.4–1.5)
GFR calc Af Amer: >60 mL/min (ref >60)
GFR calc non Af Amer: >60 mL/min (ref >60)

## 2010-06-25 LAB — TSH: TSH: 3.153 u[IU]/mL (ref 0.350–4.500)

## 2010-06-25 LAB — BRAIN NATRIURETIC PEPTIDE: Pro B Natriuretic peptide (BNP): 190 pg/mL — ABNORMAL HIGH (ref 0.0–100.0)

## 2010-06-25 LAB — URINALYSIS, ROUTINE W REFLEX MICROSCOPIC
Bilirubin Urine: NEGATIVE
Glucose, UA: NEGATIVE mg/dL
Hgb urine dipstick: NEGATIVE
Ketones, ur: NEGATIVE mg/dL
Nitrite: NEGATIVE
Protein, ur: NEGATIVE mg/dL
Specific Gravity, Urine: 1.018 (ref 1.005–1.030)
Urobilinogen, UA: 1 mg/dL (ref 0.0–1.0)
pH: 5 (ref 5.0–8.0)

## 2010-06-25 LAB — CULTURE, BLOOD (ROUTINE X 2): Culture: NO GROWTH

## 2010-07-04 ENCOUNTER — Telehealth: Payer: Self-pay | Admitting: *Deleted

## 2010-07-04 NOTE — Telephone Encounter (Signed)
Pt would like to have 02 to use outside in the pollen.

## 2010-07-04 NOTE — Telephone Encounter (Signed)
ok 

## 2010-07-04 NOTE — Telephone Encounter (Signed)
Called pt - wanting portable o2  Will fax order to [facility]

## 2010-07-07 ENCOUNTER — Telehealth: Payer: Self-pay | Admitting: *Deleted

## 2010-07-07 LAB — APTT: aPTT: 30 seconds (ref 24–37)

## 2010-07-07 LAB — DIFFERENTIAL
Basophils Relative: 1 % (ref 0–1)
Lymphocytes Relative: 30 % (ref 12–46)
Monocytes Absolute: 0.4 10*3/uL (ref 0.1–1.0)
Monocytes Relative: 8 % (ref 3–12)
Neutro Abs: 3.1 10*3/uL (ref 1.7–7.7)

## 2010-07-07 LAB — CBC
Hemoglobin: 13 g/dL (ref 13.0–17.0)
RBC: 4.53 MIL/uL (ref 4.22–5.81)
RDW: 15.9 % — ABNORMAL HIGH (ref 11.5–15.5)
WBC: 5.3 10*3/uL (ref 4.0–10.5)

## 2010-07-07 LAB — CK TOTAL AND CKMB (NOT AT ARMC)
CK, MB: 3.6 ng/mL (ref 0.3–4.0)
Total CK: 94 U/L (ref 7–232)

## 2010-07-07 LAB — COMPREHENSIVE METABOLIC PANEL
AST: 26 U/L (ref 0–37)
Albumin: 3.3 g/dL — ABNORMAL LOW (ref 3.5–5.2)
Alkaline Phosphatase: 66 U/L (ref 39–117)
BUN: 18 mg/dL (ref 6–23)
GFR calc Af Amer: >60 mL/min (ref >60)
Potassium: 4.3 mEq/L (ref 3.5–5.1)
Total Protein: 6.2 g/dL (ref 6.0–8.3)

## 2010-07-07 LAB — LIPID PANEL
Cholesterol: 169 mg/dL (ref 0–200)
HDL: 41 mg/dL (ref >39)
LDL Cholesterol: 116 mg/dL — ABNORMAL HIGH (ref 0–99)
Total CHOL/HDL Ratio: 4.1 RATIO

## 2010-07-07 LAB — TROPONIN I: Troponin I: 0.07 ng/mL — ABNORMAL HIGH (ref 0.00–0.06)

## 2010-07-07 LAB — GLUCOSE, CAPILLARY: Glucose-Capillary: 98 mg/dL (ref 70–99)

## 2010-07-07 LAB — PROTIME-INR: Prothrombin Time: 25.6 seconds — ABNORMAL HIGH (ref 11.6–15.2)

## 2010-07-07 NOTE — Telephone Encounter (Signed)
Confirm the patient is using Symbicort 2 puffs twice daily and also using his nebulizer treatment every 6 hours;  office visit this week if patient desires

## 2010-07-07 NOTE — Telephone Encounter (Signed)
o2 is helping but not enough.....wants to know if there is anything else he can use??  Uses 3.5 liters.

## 2010-07-08 NOTE — Telephone Encounter (Signed)
Spoke with [name] - informed of dr. Tanna Savoy instructions - he had not been using symbicort bid - will call if he thinks he need appt . Also told him dr. Amador Cunas will be out of office thurs and office closed fri.

## 2010-07-14 ENCOUNTER — Encounter: Payer: Self-pay | Admitting: Internal Medicine

## 2010-07-14 ENCOUNTER — Ambulatory Visit (INDEPENDENT_AMBULATORY_CARE_PROVIDER_SITE_OTHER): Payer: Medicare Other | Admitting: Internal Medicine

## 2010-07-14 DIAGNOSIS — I10 Essential (primary) hypertension: Secondary | ICD-10-CM

## 2010-07-14 DIAGNOSIS — J441 Chronic obstructive pulmonary disease with (acute) exacerbation: Secondary | ICD-10-CM

## 2010-07-14 DIAGNOSIS — I5022 Chronic systolic (congestive) heart failure: Secondary | ICD-10-CM

## 2010-07-14 DIAGNOSIS — I4891 Unspecified atrial fibrillation: Secondary | ICD-10-CM

## 2010-07-14 MED ORDER — SULFACETAMIDE SODIUM 10 % OP SOLN: 2.0000 [drp] | Freq: Four times a day (QID) | OPHTHALMIC | Status: DC

## 2010-07-14 NOTE — Progress Notes (Signed)
  Subjective:    Patient ID: [name], male    DOB: 15-Mar-1918, 77 y.o.   MRN: [id number]  HPI   77 year old patient has a history of advanced COPD and chronic systolic heart failure. He has chronic atrial for ablation and has done quite well his only complaint today is some itching and redness involving his left eye. He's had some significant allergy symptoms when he ventures out doors. Otherwise his cardiopulmonary status has been stable. He has spinal stenosis which has been reasonably well-controlled denies much in the way of pain today. He has a history of coronary artery disease and has been stable. Denies any chest pain medical regimen includes Plavix which he continues to tolerate well.   Review of Systems  Constitutional: Negative for fever, chills, appetite change and fatigue.  HENT: Negative for hearing loss, ear pain, congestion, sore throat, trouble swallowing, neck stiffness, dental problem, voice change and tinnitus.   Eyes: Positive for redness and itching. Negative for pain, discharge and visual disturbance.  Respiratory: Negative for cough, chest tightness, wheezing and stridor.   Cardiovascular: Negative for chest pain, palpitations and leg swelling.  Gastrointestinal: Negative for nausea, vomiting, abdominal pain, diarrhea, constipation, blood in stool and abdominal distention.  Genitourinary: Negative for urgency, hematuria, flank pain, discharge, difficulty urinating and genital sores.  Musculoskeletal: Negative for myalgias, back pain, joint swelling, arthralgias and gait problem.  Skin: Negative for rash.  Neurological: Negative for dizziness, syncope, speech difficulty, weakness, numbness and headaches.  Hematological: Negative for adenopathy. Does not bruise/bleed easily.  Psychiatric/Behavioral: Negative for behavioral problems and dysphoric mood. The patient is not nervous/anxious.        Objective:   Physical Exam  Constitutional: He is oriented  to person, place, and time. He appears well-developed.  HENT:  Head: Normocephalic.  Right Ear: External ear normal.  Left Ear: External ear normal.  Eyes: Conjunctivae and EOM are normal.  Neck: Normal range of motion.  Cardiovascular: Normal rate and normal heart sounds.   Pulmonary/Chest: Breath sounds normal.       Generally diminished breath sounds O2 saturation 93 Pulse rate 90 and irregular  Abdominal: Bowel sounds are normal.  Musculoskeletal: Normal range of motion. He exhibits no edema and no tenderness.  Neurological: He is alert and oriented to person, place, and time.  Psychiatric: He has a normal mood and affect. His behavior is normal.          Assessment & Plan:  Chronic atrial fibrillation Hypertension stable COPD stable Chronic systolic heart failure well compensated  Allergic rhinitis/ allergic conjunctivitis

## 2010-07-14 NOTE — Patient Instructions (Signed)
Limit your sodium (Salt) intake  Return in 3 months for follow-up   

## 2010-07-15 LAB — DIFFERENTIAL
Basophils Absolute: 0 10*3/uL (ref 0.0–0.1)
Basophils Absolute: 0 10*3/uL (ref 0.0–0.1)
Basophils Relative: 1 % (ref 0–1)
Eosinophils Absolute: 0.1 10*3/uL (ref 0.0–0.7)
Eosinophils Relative: 2 % (ref 0–5)
Lymphocytes Relative: 15 % (ref 12–46)
Monocytes Absolute: 0.5 10*3/uL (ref 0.1–1.0)
Monocytes Absolute: 0.3 10*3/uL (ref 0.1–1.0)
Monocytes Relative: 8 % (ref 3–12)
Neutro Abs: 4.9 10*3/uL (ref 1.7–7.7)
Neutrophils Relative %: 80 % — ABNORMAL HIGH (ref 43–77)

## 2010-07-15 LAB — HEMOGLOBIN A1C: Mean Plasma Glucose: 123 mg/dL

## 2010-07-15 LAB — CBC
HCT: 37.8 % — ABNORMAL LOW (ref 39.0–52.0)
HCT: 42 % (ref 39.0–52.0)
Hemoglobin: 12.7 g/dL — ABNORMAL LOW (ref 13.0–17.0)
MCHC: 33.5 g/dL (ref 30.0–36.0)
MCV: 86.3 fL (ref 78.0–100.0)
MCV: 86.5 fL (ref 78.0–100.0)
Platelets: 153 10*3/uL (ref 150–400)
RBC: 4.38 MIL/uL (ref 4.22–5.81)
RDW: 15.1 % (ref 11.5–15.5)
WBC: 6.2 10*3/uL (ref 4.0–10.5)

## 2010-07-15 LAB — URINALYSIS, ROUTINE W REFLEX MICROSCOPIC
Bilirubin Urine: NEGATIVE
Glucose, UA: NEGATIVE mg/dL
Glucose, UA: NEGATIVE mg/dL
Hgb urine dipstick: NEGATIVE
Nitrite: NEGATIVE
Specific Gravity, Urine: 1.01 (ref 1.005–1.030)
Specific Gravity, Urine: 1.016 (ref 1.005–1.030)
Urobilinogen, UA: 0.2 mg/dL (ref 0.0–1.0)
pH: 7 (ref 5.0–8.0)

## 2010-07-15 LAB — POCT CARDIAC MARKERS: Troponin i, poc: <0.05 ng/mL (ref 0.00–0.09)

## 2010-07-15 LAB — CARDIAC PANEL(CRET KIN+CKTOT+MB+TROPI): Relative Index: INVALID (ref 0.0–2.5)

## 2010-07-15 LAB — URINE CULTURE
Colony Count: NO GROWTH
Culture: NO GROWTH

## 2010-07-15 LAB — CK TOTAL AND CKMB (NOT AT ARMC)
CK, MB: 4.5 ng/mL — ABNORMAL HIGH (ref 0.3–4.0)
Relative Index: INVALID (ref 0.0–2.5)
Total CK: 38 U/L (ref 7–232)

## 2010-07-15 LAB — BASIC METABOLIC PANEL
CO2: 32 mEq/L (ref 19–32)
Calcium: 8.5 mg/dL (ref 8.4–10.5)
Chloride: 98 mEq/L (ref 96–112)
GFR calc Af Amer: >60 mL/min (ref >60)
Glucose, Bld: 104 mg/dL — ABNORMAL HIGH (ref 70–99)
Sodium: 133 mEq/L — ABNORMAL LOW (ref 135–145)

## 2010-07-15 LAB — LIPASE, BLOOD: Lipase: 16 U/L (ref 11–59)

## 2010-07-15 LAB — COMPREHENSIVE METABOLIC PANEL
Albumin: 3.2 g/dL — ABNORMAL LOW (ref 3.5–5.2)
BUN: 15 mg/dL (ref 6–23)
Chloride: 99 mEq/L (ref 96–112)
Creatinine, Ser: 0.55 mg/dL (ref 0.4–1.5)
GFR calc non Af Amer: >60 mL/min (ref >60)
Glucose, Bld: 157 mg/dL — ABNORMAL HIGH (ref 70–99)
Total Bilirubin: 0.7 mg/dL (ref 0.3–1.2)

## 2010-07-15 LAB — APTT: aPTT: 27 seconds (ref 24–37)

## 2010-07-15 LAB — LIPID PANEL
LDL Cholesterol: 143 mg/dL — ABNORMAL HIGH (ref 0–99)
Total CHOL/HDL Ratio: 5.2 RATIO
Triglycerides: 59 mg/dL (ref <150)
VLDL: 12 mg/dL (ref 0–40)

## 2010-07-16 LAB — CARDIAC PANEL(CRET KIN+CKTOT+MB+TROPI)
CK, MB: 3.6 ng/mL (ref 0.3–4.0)
CK, MB: 4 ng/mL (ref 0.3–4.0)
Relative Index: INVALID (ref 0.0–2.5)
Total CK: 175 U/L (ref 7–232)
Total CK: 81 U/L (ref 7–232)
Troponin I: 0.02 ng/mL (ref 0.00–0.06)
Troponin I: 0.04 ng/mL (ref 0.00–0.06)
Troponin I: 0.04 ng/mL (ref 0.00–0.06)

## 2010-07-16 LAB — CBC
HCT: 34.3 % — ABNORMAL LOW (ref 39.0–52.0)
HCT: 36.3 % — ABNORMAL LOW (ref 39.0–52.0)
HCT: 39.5 % (ref 39.0–52.0)
Hemoglobin: 12.8 g/dL — ABNORMAL LOW (ref 13.0–17.0)
MCHC: 33 g/dL (ref 30.0–36.0)
MCHC: 33.9 g/dL (ref 30.0–36.0)
MCHC: 34.1 g/dL (ref 30.0–36.0)
MCHC: 35.1 g/dL (ref 30.0–36.0)
MCV: 85.6 fL (ref 78.0–100.0)
MCV: 86.3 fL (ref 78.0–100.0)
MCV: 86.4 fL (ref 78.0–100.0)
MCV: 87.7 fL (ref 78.0–100.0)
MCV: 87.9 fL (ref 78.0–100.0)
Platelets: 136 10*3/uL — ABNORMAL LOW (ref 150–400)
Platelets: 136 10*3/uL — ABNORMAL LOW (ref 150–400)
RBC: 3.98 MIL/uL — ABNORMAL LOW (ref 4.22–5.81)
RBC: 4.37 MIL/uL (ref 4.22–5.81)
RBC: 4.5 MIL/uL (ref 4.22–5.81)
RDW: 13.4 % (ref 11.5–15.5)
RDW: 13.6 % (ref 11.5–15.5)
RDW: 13.7 % (ref 11.5–15.5)
RDW: 14 % (ref 11.5–15.5)
WBC: 11 10*3/uL — ABNORMAL HIGH (ref 4.0–10.5)
WBC: 3.2 10*3/uL — ABNORMAL LOW (ref 4.0–10.5)
WBC: 5.6 10*3/uL (ref 4.0–10.5)

## 2010-07-16 LAB — BASIC METABOLIC PANEL
BUN: 12 mg/dL (ref 6–23)
BUN: 15 mg/dL (ref 6–23)
BUN: 16 mg/dL (ref 6–23)
BUN: 19 mg/dL (ref 6–23)
CO2: 30 mEq/L (ref 19–32)
CO2: 34 mEq/L — ABNORMAL HIGH (ref 19–32)
Calcium: 8 mg/dL — ABNORMAL LOW (ref 8.4–10.5)
Calcium: 8.2 mg/dL — ABNORMAL LOW (ref 8.4–10.5)
Chloride: 94 mEq/L — ABNORMAL LOW (ref 96–112)
Chloride: 96 mEq/L (ref 96–112)
Chloride: 98 mEq/L (ref 96–112)
Creatinine, Ser: 0.58 mg/dL (ref 0.4–1.5)
Creatinine, Ser: 0.58 mg/dL (ref 0.4–1.5)
Creatinine, Ser: 0.59 mg/dL (ref 0.4–1.5)
Creatinine, Ser: 0.6 mg/dL (ref 0.4–1.5)
Creatinine, Ser: 0.66 mg/dL (ref 0.4–1.5)
GFR calc Af Amer: >60 mL/min (ref >60)
GFR calc non Af Amer: >60 mL/min (ref >60)
GFR calc non Af Amer: >60 mL/min (ref >60)
Glucose, Bld: 129 mg/dL — ABNORMAL HIGH (ref 70–99)
Glucose, Bld: 130 mg/dL — ABNORMAL HIGH (ref 70–99)
Potassium: 4.1 mEq/L (ref 3.5–5.1)
Sodium: 133 mEq/L — ABNORMAL LOW (ref 135–145)

## 2010-07-16 LAB — DIFFERENTIAL
Basophils Absolute: 0 10*3/uL (ref 0.0–0.1)
Basophils Absolute: 0 10*3/uL (ref 0.0–0.1)
Basophils Relative: 0 % (ref 0–1)
Basophils Relative: 1 % (ref 0–1)
Eosinophils Absolute: 0 10*3/uL (ref 0.0–0.7)
Eosinophils Absolute: 0 10*3/uL (ref 0.0–0.7)
Eosinophils Absolute: 0 10*3/uL (ref 0.0–0.7)
Eosinophils Relative: 0 % (ref 0–5)
Eosinophils Relative: 0 % (ref 0–5)
Eosinophils Relative: 1 % (ref 0–5)
Lymphs Abs: 0.5 10*3/uL — ABNORMAL LOW (ref 0.7–4.0)
Lymphs Abs: 0.7 10*3/uL (ref 0.7–4.0)
Lymphs Abs: 1 10*3/uL (ref 0.7–4.0)
Monocytes Absolute: 0.3 10*3/uL (ref 0.1–1.0)
Monocytes Relative: 13 % — ABNORMAL HIGH (ref 3–12)
Neutrophils Relative %: 67 % (ref 43–77)
Neutrophils Relative %: 93 % — ABNORMAL HIGH (ref 43–77)

## 2010-07-16 LAB — CULTURE, BLOOD (ROUTINE X 2)

## 2010-07-16 LAB — BRAIN NATRIURETIC PEPTIDE: Pro B Natriuretic peptide (BNP): 714 pg/mL — ABNORMAL HIGH (ref 0.0–100.0)

## 2010-07-17 ENCOUNTER — Encounter: Payer: Self-pay | Admitting: Internal Medicine

## 2010-07-17 ENCOUNTER — Telehealth: Payer: Self-pay | Admitting: Internal Medicine

## 2010-07-17 ENCOUNTER — Ambulatory Visit (INDEPENDENT_AMBULATORY_CARE_PROVIDER_SITE_OTHER): Payer: Medicare Other | Admitting: Internal Medicine

## 2010-07-17 DIAGNOSIS — H01009 Unspecified blepharitis unspecified eye, unspecified eyelid: Secondary | ICD-10-CM

## 2010-07-17 MED ORDER — TOBRAMYCIN-DEXAMETHASONE 0.3-0.1 % OP SUSP: 1.0000 [drp] | OPHTHALMIC | Status: AC

## 2010-07-17 NOTE — Patient Instructions (Signed)
Discontinue Bleph 10 eye drops  Call or return to clinic prn if these symptoms worsen or fail to improve as anticipated.

## 2010-07-17 NOTE — Progress Notes (Signed)
  Subjective:    Patient ID: [name], male    DOB: April 12, 1917, 77 y.o.   MRN: [id number]  HPI  77-year-old patient who is seen today in followup. He was seen recently and placed on ophthalmic drops for left eye conjunctivitis. He has had worsening soft tissue swelling involving the lower eyelid with some increase in itching and mild discomfort. No fever or constitutional complaints    Review of Systems  Eyes: Positive for redness and itching.       Objective:   Physical Exam  Constitutional: He appears well-developed and well-nourished. No distress.  Eyes:       The right lower lid was erythematous and swollen. The conjunctiva was clear;  there was soft tissue swelling beneath the left eye          Assessment & Plan:   Blepharitis. Probably a component of contact dermatitis related to the sulfa allergy. We'll discontinue the sulfa eyedrops and substitute TobraDex He has been asked to apply warm compresses 4 times daily He will call if he does not promptly improve for eye  referral

## 2010-07-17 NOTE — Telephone Encounter (Signed)
Pt called and still has eye inf and now has redness in tongue and stinging sensation when he eats. Tongue may be slightly swollen. Pt req work in appt asap.  Pls advise.

## 2010-07-18 NOTE — Telephone Encounter (Signed)
Please call pt and if he still wants to be seen please make appt for today. KIK

## 2010-07-18 NOTE — Telephone Encounter (Signed)
Pt already came in for ov on 07/17/10 to see Dr Amador Cunas.

## 2010-07-22 ENCOUNTER — Ambulatory Visit (INDEPENDENT_AMBULATORY_CARE_PROVIDER_SITE_OTHER): Payer: Medicare Other | Admitting: Internal Medicine

## 2010-07-22 ENCOUNTER — Encounter: Payer: Self-pay | Admitting: Internal Medicine

## 2010-07-22 DIAGNOSIS — M48061 Spinal stenosis, lumbar region without neurogenic claudication: Secondary | ICD-10-CM

## 2010-07-22 DIAGNOSIS — R300 Dysuria: Secondary | ICD-10-CM

## 2010-07-22 DIAGNOSIS — M4800 Spinal stenosis, site unspecified: Secondary | ICD-10-CM

## 2010-07-22 MED ORDER — METHYLPREDNISOLONE (PAK) 4 MG PO TABS: ORAL_TABLET | ORAL | Status: AC

## 2010-07-22 MED ORDER — CIPROFLOXACIN HCL 500 MG PO TABS: 500.0000 mg | ORAL_TABLET | Freq: Two times a day (BID) | ORAL | Status: AC

## 2010-07-22 NOTE — Progress Notes (Signed)
  Subjective:    Patient ID: [name], male    DOB: Sep 16, 1917, 77 y.o.   MRN: [id number]  HPI patient presents to clinic for evaluation of left leg pain and difficulty urination. Chart is reviewed and he has a long-standing history of lumbar spinal stenosis having undergone lumbar laminectomy in the past. Has had intermittent difficulty with radicular pain and underwent epidural injections without improvement. Did have improvement with physical therapy in the past and was felt not to be a surgical candidate. Recently yesterday noted left leg pain radiating down intermittently to the foot. His been no injury or trauma or precipitating event. Denies any weakness or loss of strength. Takes Ultram chronically and feels that Ultram helps his leg pain somewhat. Also notes recent the past two weeks fatigue as well as difficulty with urination and mild dysuria. No fever or chills. States he had the symptoms in the past and had unremarkable UA is at the time. Has history of atrial fibrillation and denies palpitations.  Reviewed past medical history, medications and allergies.    Review of Systems  Constitutional: Negative for fever and chills.  Cardiovascular: Negative for chest pain and palpitations.  Genitourinary: Positive for dysuria and difficulty urinating.  Musculoskeletal: Positive for back pain and gait problem.  Neurological: Negative for weakness.       Objective:   Physical Exam  [nursing notereviewed. Constitutional: He appears well-developed and well-nourished. No distress.  HENT:  Head: Normocephalic and atraumatic.  Left Ear: External ear normal.  Nose: Nose normal.  Eyes: Conjunctivae are normal. No scleral icterus.  Musculoskeletal:       ambulates with assistance of walker. Left leg strength 5/5. Full range of motion. No calf swelling or tenderness. No palpable cords. Modified straight leg raise positive.  Neurological: He is alert.  Skin: Skin is warm and dry.  No rash noted. He is not diaphoretic. No erythema.  Psychiatric: He has a normal mood and affect.          Assessment & Plan:

## 2010-07-22 NOTE — Assessment & Plan Note (Signed)
Clinical presentation is most consistent with radicular leg pain. Neurologically nonfocal. Attempt Medrol Dosepak. Follow closely for improvement or worsening.

## 2010-07-22 NOTE — Assessment & Plan Note (Signed)
Clinically suggestive of prostatitis versus UTI. Begin antibiotic  course of Cipro x10 days. Followup if no improvement or worsening.

## 2010-07-31 ENCOUNTER — Emergency Department (HOSPITAL_COMMUNITY): Payer: Medicare Other

## 2010-07-31 ENCOUNTER — Encounter (HOSPITAL_COMMUNITY): Payer: Self-pay

## 2010-07-31 ENCOUNTER — Inpatient Hospital Stay (HOSPITAL_COMMUNITY)
Admission: EM | Admit: 2010-07-31 | Discharge: 2010-08-05 | DRG: 351 | Disposition: A | Discharge status: Skilled Nursing Facility | Payer: Medicare Other | Attending: Pulmonary Disease | Admitting: Pulmonary Disease

## 2010-07-31 DIAGNOSIS — Z7902 Long term (current) use of antithrombotics/antiplatelets: Secondary | ICD-10-CM

## 2010-07-31 DIAGNOSIS — E039 Hypothyroidism, unspecified: Secondary | ICD-10-CM | POA: Diagnosis present

## 2010-07-31 DIAGNOSIS — R5381 Other malaise: Secondary | ICD-10-CM | POA: Diagnosis present

## 2010-07-31 DIAGNOSIS — Z9981 Dependence on supplemental oxygen: Secondary | ICD-10-CM

## 2010-07-31 DIAGNOSIS — J449 Chronic obstructive pulmonary disease, unspecified: Secondary | ICD-10-CM | POA: Diagnosis present

## 2010-07-31 DIAGNOSIS — Y832 Surgical operation with anastomosis, bypass or graft as the cause of abnormal reaction of the patient, or of later complication, without mention of misadventure at the time of the procedure: Secondary | ICD-10-CM | POA: Diagnosis present

## 2010-07-31 DIAGNOSIS — IMO0002 Reserved for concepts with insufficient information to code with codable children: Secondary | ICD-10-CM | POA: Diagnosis not present

## 2010-07-31 DIAGNOSIS — I472 Ventricular tachycardia, unspecified: Secondary | ICD-10-CM | POA: Diagnosis not present

## 2010-07-31 DIAGNOSIS — Y9210 Unspecified residential institution as the place of occurrence of the external cause: Secondary | ICD-10-CM | POA: Diagnosis present

## 2010-07-31 DIAGNOSIS — Z87891 Personal history of nicotine dependence: Secondary | ICD-10-CM

## 2010-07-31 DIAGNOSIS — I10 Essential (primary) hypertension: Secondary | ICD-10-CM | POA: Diagnosis present

## 2010-07-31 DIAGNOSIS — F411 Generalized anxiety disorder: Secondary | ICD-10-CM | POA: Diagnosis present

## 2010-07-31 DIAGNOSIS — N400 Enlarged prostate without lower urinary tract symptoms: Secondary | ICD-10-CM | POA: Diagnosis present

## 2010-07-31 DIAGNOSIS — I2510 Atherosclerotic heart disease of native coronary artery without angina pectoris: Secondary | ICD-10-CM | POA: Diagnosis present

## 2010-07-31 DIAGNOSIS — K4031 Unilateral inguinal hernia, with obstruction, without gangrene, recurrent: Principal | ICD-10-CM | POA: Diagnosis present

## 2010-07-31 DIAGNOSIS — K5900 Constipation, unspecified: Secondary | ICD-10-CM | POA: Diagnosis present

## 2010-07-31 DIAGNOSIS — J4489 Other specified chronic obstructive pulmonary disease: Secondary | ICD-10-CM | POA: Diagnosis present

## 2010-07-31 DIAGNOSIS — I509 Heart failure, unspecified: Secondary | ICD-10-CM | POA: Diagnosis present

## 2010-07-31 DIAGNOSIS — M1990 Unspecified osteoarthritis, unspecified site: Secondary | ICD-10-CM | POA: Diagnosis present

## 2010-07-31 DIAGNOSIS — K219 Gastro-esophageal reflux disease without esophagitis: Secondary | ICD-10-CM | POA: Diagnosis present

## 2010-07-31 DIAGNOSIS — I4891 Unspecified atrial fibrillation: Secondary | ICD-10-CM | POA: Diagnosis present

## 2010-07-31 DIAGNOSIS — I4729 Other ventricular tachycardia: Secondary | ICD-10-CM | POA: Diagnosis not present

## 2010-07-31 DIAGNOSIS — I080 Rheumatic disorders of both mitral and aortic valves: Secondary | ICD-10-CM | POA: Diagnosis present

## 2010-07-31 DIAGNOSIS — E46 Unspecified protein-calorie malnutrition: Secondary | ICD-10-CM | POA: Diagnosis present

## 2010-07-31 LAB — CBC
HCT: 42.5 % (ref 39.0–52.0)
Hemoglobin: 13.8 g/dL (ref 13.0–17.0)
MCHC: 32.5 g/dL (ref 30.0–36.0)
RBC: 5.07 MIL/uL (ref 4.22–5.81)

## 2010-07-31 LAB — DIGOXIN LEVEL: Digoxin Level: <0.2 ng/mL — ABNORMAL LOW (ref 0.8–2.0)

## 2010-07-31 LAB — DIFFERENTIAL
Basophils Absolute: 0 10*3/uL (ref 0.0–0.1)
Basophils Relative: 0 % (ref 0–1)
Lymphocytes Relative: 8 % — ABNORMAL LOW (ref 12–46)
Monocytes Absolute: 0.5 10*3/uL (ref 0.1–1.0)
Monocytes Relative: 5 % (ref 3–12)
Neutro Abs: 9.4 10*3/uL — ABNORMAL HIGH (ref 1.7–7.7)
Neutrophils Relative %: 87 % — ABNORMAL HIGH (ref 43–77)

## 2010-07-31 LAB — COMPREHENSIVE METABOLIC PANEL
ALT: 29 U/L (ref 0–53)
AST: 24 U/L (ref 0–37)
Alkaline Phosphatase: 60 U/L (ref 39–117)
CO2: 29 mEq/L (ref 19–32)
Chloride: 95 mEq/L — ABNORMAL LOW (ref 96–112)
GFR calc Af Amer: >60 mL/min (ref >60)
GFR calc non Af Amer: >60 mL/min (ref >60)
Glucose, Bld: 167 mg/dL — ABNORMAL HIGH (ref 70–99)
Potassium: 4.5 mEq/L (ref 3.5–5.1)
Sodium: 134 mEq/L — ABNORMAL LOW (ref 135–145)
Total Bilirubin: 0.9 mg/dL (ref 0.3–1.2)

## 2010-07-31 LAB — URINALYSIS, ROUTINE W REFLEX MICROSCOPIC
Bilirubin Urine: NEGATIVE
Leukocytes, UA: NEGATIVE
Nitrite: NEGATIVE
Protein, ur: 30 mg/dL — AB
Urobilinogen, UA: 1 mg/dL (ref 0.0–1.0)
pH: 7 (ref 5.0–8.0)

## 2010-07-31 LAB — APTT: aPTT: 27 seconds (ref 24–37)

## 2010-07-31 LAB — MRSA PCR SCREENING: MRSA by PCR: NEGATIVE

## 2010-07-31 LAB — PROTIME-INR
INR: 1.11 (ref 0.00–1.49)
Prothrombin Time: 14.5 seconds (ref 11.6–15.2)

## 2010-07-31 LAB — LIPASE, BLOOD: Lipase: 23 U/L (ref 11–59)

## 2010-07-31 LAB — BRAIN NATRIURETIC PEPTIDE: Pro B Natriuretic peptide (BNP): 286 pg/mL — ABNORMAL HIGH (ref 0.0–100.0)

## 2010-07-31 LAB — POCT CARDIAC MARKERS
CKMB, poc: 2.2 ng/mL (ref 1.0–8.0)
Troponin i, poc: <0.05 ng/mL (ref 0.00–0.09)

## 2010-07-31 LAB — TSH: TSH: 5.547 u[IU]/mL — ABNORMAL HIGH (ref 0.350–4.500)

## 2010-07-31 MED ORDER — IOHEXOL 300 MG/ML  SOLN
100.0000 mL | Freq: Once | INTRAMUSCULAR | Status: AC | PRN
  Administered 2010-07-31: 100 mL via INTRAVENOUS

## 2010-08-01 ENCOUNTER — Inpatient Hospital Stay (HOSPITAL_COMMUNITY): Payer: Medicare Other

## 2010-08-01 LAB — CARDIAC PANEL(CRET KIN+CKTOT+MB+TROPI): Total CK: 50 U/L (ref 7–232)

## 2010-08-01 LAB — CBC
HCT: 39.8 % (ref 39.0–52.0)
Hemoglobin: 12.5 g/dL — ABNORMAL LOW (ref 13.0–17.0)
MCH: 26.5 pg (ref 26.0–34.0)
MCHC: 31.4 g/dL (ref 30.0–36.0)
MCV: 84.5 fL (ref 78.0–100.0)
RDW: 14 % (ref 11.5–15.5)

## 2010-08-01 LAB — BASIC METABOLIC PANEL
BUN: 20 mg/dL (ref 6–23)
CO2: 29 mEq/L (ref 19–32)
Calcium: 8.3 mg/dL — ABNORMAL LOW (ref 8.4–10.5)
GFR calc non Af Amer: >60 mL/min (ref >60)
Glucose, Bld: 115 mg/dL — ABNORMAL HIGH (ref 70–99)

## 2010-08-02 ENCOUNTER — Inpatient Hospital Stay (HOSPITAL_COMMUNITY): Payer: Medicare Other

## 2010-08-02 LAB — CARDIAC PANEL(CRET KIN+CKTOT+MB+TROPI)
CK, MB: 1.8 ng/mL (ref 0.3–4.0)
Relative Index: INVALID (ref 0.0–2.5)
Relative Index: INVALID (ref 0.0–2.5)
Troponin I: 0.06 ng/mL (ref 0.00–0.06)
Troponin I: 0.06 ng/mL (ref 0.00–0.06)

## 2010-08-02 LAB — CBC
Hemoglobin: 12 g/dL — ABNORMAL LOW (ref 13.0–17.0)
MCH: 26.7 pg (ref 26.0–34.0)
MCV: 84.9 fL (ref 78.0–100.0)
Platelets: 132 10*3/uL — ABNORMAL LOW (ref 150–400)
RBC: 4.49 MIL/uL (ref 4.22–5.81)
WBC: 7.9 10*3/uL (ref 4.0–10.5)

## 2010-08-02 LAB — BASIC METABOLIC PANEL
BUN: 19 mg/dL (ref 6–23)
CO2: 29 mEq/L (ref 19–32)
Chloride: 101 mEq/L (ref 96–112)
Creatinine, Ser: 0.62 mg/dL (ref 0.4–1.5)
GFR calc Af Amer: >60 mL/min (ref >60)
Potassium: 3.9 mEq/L (ref 3.5–5.1)

## 2010-08-03 LAB — CBC
HCT: 34.8 % — ABNORMAL LOW (ref 39.0–52.0)
Hemoglobin: 10.9 g/dL — ABNORMAL LOW (ref 13.0–17.0)
RBC: 4.12 MIL/uL — ABNORMAL LOW (ref 4.22–5.81)
WBC: 6.2 10*3/uL (ref 4.0–10.5)

## 2010-08-03 LAB — BASIC METABOLIC PANEL
CO2: 32 mEq/L (ref 19–32)
Glucose, Bld: 102 mg/dL — ABNORMAL HIGH (ref 70–99)
Potassium: 3.3 mEq/L — ABNORMAL LOW (ref 3.5–5.1)
Sodium: 134 mEq/L — ABNORMAL LOW (ref 135–145)

## 2010-08-05 LAB — CBC
HCT: 32.5 % — ABNORMAL LOW (ref 39.0–52.0)
MCV: 82.5 fL (ref 78.0–100.0)
Platelets: 146 10*3/uL — ABNORMAL LOW (ref 150–400)
RBC: 3.94 MIL/uL — ABNORMAL LOW (ref 4.22–5.81)
WBC: 6.7 10*3/uL (ref 4.0–10.5)

## 2010-08-05 LAB — CREATININE, SERUM
Creatinine, Ser: 0.49 mg/dL (ref 0.4–1.5)
GFR calc non Af Amer: >60 mL/min (ref >60)

## 2010-08-05 LAB — POTASSIUM: Potassium: 3.4 mEq/L — ABNORMAL LOW (ref 3.5–5.1)

## 2010-08-06 NOTE — Op Note (Signed)
[name], [name]         ACCOUNT NO.:  [id number]  MEDICAL RECORD NO.:  [id number]           PATIENT TYPE:  I  LOCATION:  1610                         FACILITY:  [facility]  PHYSICIAN:  Abigail Miyamoto, M.D. DATE OF BIRTH:  09/26/1917  DATE OF PROCEDURE:  07/31/2010 DATE OF DISCHARGE:                              OPERATIVE REPORT   PREOPERATIVE DIAGNOSIS:  Incarcerated right inguinal hernia.  POSTOPERATIVE DIAGNOSIS:  Incarcerated right inguinal hernia.  PROCEDURE:  Repair of incarcerated right inguinal hernia with mesh (Parietex ProGrip Prolene mesh).  SURGEON:  Abigail Miyamoto, M.D.  ANESTHESIA:  General.  ESTIMATED BLOOD LOSS:  Minimal.  INDICATIONS:  [name] is a 77 year old gentleman who presents with nausea, vomiting, and abdominal pain.  He was found on CT of the abdomen to have an incarcerated right inguinal hernia.  Surgery was thus consulted and the decision was made to proceed to the operating room emergently.  FINDINGS:  The patient was found to have an incarcerated right inguinal hernia containing small-bowel.  There was no evidence of bowel ischemia. The hernia was repaired with mesh.  PROCEDURE IN DETAIL:  The patient was brought to the operative room and identified as [name].  He was placed supine on the operating room table and general anesthesia was induced.  His abdomen was then prepped and draped in the usual sterile fashion.  Using a #15 blade, a right lower quadrant incision was then made through the patient's previous groin incision.  This was taken down to the hernia, which was easily identified.  It was coming out under the edge of the external oblique fascia, which I had to open.  The hernia was actually an indirect inguinal hernia.  As I was manipulating the sac, the contents fell back into the abdominal cavity.  I thus opened the sac. The patient had a moderate amount of ascites, which I aspirated.  I then was  able to grasp the small-bowel with the Babcock clamp and eviscerate it through the small incision.  I identified the area of bowel which was incarcerated into the hernia sac.  It was mildly erythematous and bruised, but there was no evidence of perforation or ischemia.  I eviscerated several feet of small-bowel proximal and distal to this and found no other evidence of problems.  I was able to easily milk the small-bowel contents through the area that had been incarcerated.  This seemed to flow through the bowel easily.  I then reduced all of the small-bowel back into the abdominal cavity.  The patient did have the cecum stuck to the hernia sac, which I excised with the Metzenbaum scissors and I was able to reduce this into the peritoneum as well.  I then closed the base of the sac with several 2-0 silk sutures.  Once this was done I reinforced the fourth inguinal floor with several interrupted 0 Novafil sutures.  I then brought a piece of Parietex ProGrip mesh onto the field.  I placed it around the cord structures and secured it to the inguinal floor.  I placed a Novafil suture securing the mesh to the pubic tubercle.  I then was able to  close the tissue and fascia over the top of this with a running 3-0 Vicryl suture.  I then closed the skin with a running 4-0 Monocryl.  Steri-Strips, gauze and tape were then applied.  The patient tolerated the procedure well.  All counts were correct at the end of the procedure.  The patient was then extubated in the operating room and taken in stable condition to the recovery room.     Abigail Miyamoto, M.D.     DB/MEDQ  D:  07/31/2010  T:  07/31/2010  Job:  [id number]  Electronically Signed by Abigail Miyamoto M.D. on 08/06/2010 01:56:20 PM

## 2010-08-06 NOTE — Op Note (Signed)
  [name], [name]         ACCOUNT NO.:  [id number]  MEDICAL RECORD NO.:  [id number]           PATIENT TYPE:  I  LOCATION:  9562                         FACILITY:  [facility]  PHYSICIAN:  Abigail Miyamoto, M.D. DATE OF BIRTH:  [birth year]  DATE OF PROCEDURE: DATE OF DISCHARGE:                              OPERATIVE REPORT   ADDENDUM  PREOPERATIVE DIAGNOSIS:  Incarcerated recurrent right inguinal hernia.  POSTOPERATIVE DIAGNOSIS:  Incarcerated recurrent right inguinal hernia.  PROCEDURE:  Repair of incarcerated recurrent right inguinal hernia with mesh.     Abigail Miyamoto, M.D.     DB/MEDQ  D:  07/31/2010  T:  07/31/2010  Job:  [id number]  Electronically Signed by Abigail Miyamoto M.D. on 08/06/2010 01:56:22 PM

## 2010-08-11 NOTE — Consult Note (Signed)
NAME:  [name], [name] NO.:  [id number]  MEDICAL RECORD NO.:  [id number]           PATIENT TYPE:  E  LOCATION:  [facility]LED                         FACILITY:  [facility]  PHYSICIAN:  [name], MDDATE OF BIRTH:  1917-06-30  DATE OF CONSULTATION:  07/31/2010 DATE OF DISCHARGE:                                CONSULTATION   REFERRING PHYSICIAN:  Dr. [name], [facility]  PRIMARY CARE PHYSICIAN:  Gordy Savers, MD, from [place].  REASON FOR ADMISSION:  Nausea, vomiting, and abdominal pain secondary to incarcerated hernia and small bowel obstruction.  HISTORY OF PRESENT ILLNESS:  [name] is a 77 year old male with a past medical history significant for chronic atrial fibrillation, COPD, hypertension, hypothyroidism, CHF, and coronary artery disease who came from home through EMS with complaint of nausea, vomiting, and abdominal pain.  The patient reports that the symptoms have been present for the last 24-48 hours and worsening in nature, being unable to keep things down.  While the patient was in the emergency department, a CT of his abdomen demonstrated incarcerated inguinal hernia on the right side and Surgery was consulted for immediate surgery.  Due to his chronic medical problems, [facility] has been asked for a formal consultation in order to take care of his other medical issues, which are chronic and currently stable.  PAST MEDICAL HISTORY:  As mentioned above, 1. Chronic atrial fibrillation, not on Coumadin. 2. COPD. 3. Hypertension. 4. Hypothyroidism. 5. Congestive heart failure. 6. Coronary artery disease. 7. Peripheral vascular disease. 8. History of peptic ulcer. 9. Osteoarthritis. 10.Spinal stenosis.  PAST SURGICAL HISTORY:  The patient also had a past surgical history of appendectomy; inguinal herniorrhaphy; tonsillectomy; abdominal aortic aneurysm repair; lumbar laminectomy in 1967, 1977, 1998; TURP;  and also cataract surgery.  SOCIAL HISTORY:  The patient is a former smoker.  He is married, lives at home with his wife.  The patient denies alcohol or any illicit drugs use.  DRUG ALLERGIES:  There is a questionable allergic reaction to NOVOCAINE, unfortunately, the patient is not aware of having any allergies to medications, it is just listed on the records.  MEDICATIONS:  The patient's med rec is still pending at the moment of this dictation, but looking into his records and previous hospitalization plus discharge summaries, the patient is supposed to be taking, 1. Levothyroxine 50 mcg by mouth daily. 2. Omeprazole 20 mg by mouth daily. 3. Lorazepam 0.5 mg by mouth twice a day as needed for agitation and     anxiety. 4. Sublingual nitroglycerin 0.4 mg by mouth every 5 minutes p.r.n. for     chest pain. 5. Symbicort 80/4.5 mg 2 puffs b.i.d. 6. Atrovent 1 nebulizer t.i.d. as needed. 7. Lasix 20 mg by mouth twice a day. 8. Flomax 0.4 mg p.o. daily. 9. Coreg 3.125 mg by mouth twice a day. 10.Potassium 10 mEq by mouth daily. 11.Tramadol 50 mg q.6 h. as needed for pain. 12.Fentanyl patch 25 mcg topically every 72 hours. 13.Plavix 75 mg by mouth daily. 14.MiraLax 17 g by mouth daily. 15.Digoxin 0.125 mg by mouth daily.  FAMILY HISTORY:  Noncontributory.  REVIEW OF SYSTEMS:  Negative  for all 10-organ systems except for pertinent positives as stated above on the patient's HPI.  PHYSICAL EXAMINATION:  VITAL SIGNS:  Temperature 97.6, heart rate 113/120, respiratory 17, blood pressure 165/102. GENERAL:  The patient is in no acute distress. HEENT:  The patient is edentulous with mildly dry mucous membranes.  No exudates or erythema.  PERRLA.  Extraocular muscles intact.  Anicteric. No nystagmus. NECK:  Without JVD or bruits.  Supple.  No thyromegaly. HEART:  Irregularly irregular.  S1, S2 heard.  No murmurs, gallops, or rubs. LUNGS:  Clear to auscultation bilaterally. ABDOMEN:   Soft, tender to palpation especially in the right lower quadrant, mild distention.  Decreased bowel sounds.  There is a right inguinal hernia that is not reducible manually and is in fact hard to palpation. EXTREMITIES:  There is no cyanosis, clubbing, or edema.  There are decreased distal pulses bilaterally. SKIN:  Without rashes. LYMPH NODES:  No adenopathy. NEUROLOGIC:  Nonfocal.  Cranial nerves II through XII are grossly intact.  Reflexes 2+ symmetrically bilaterally.  Motor strength 4/5 in all 4 extremities bilaterally and symmetrically all secondary to poor effort.  Pinprick intact.  PERTINENT LABORATORY DATA:  The patient had a BMP of 286.  Occult blood positive.  Urinalysis, yellow, cloudy with a negative nitrite, negative leukocytes, negative blood.  Cardiac markers demonstrated a troponin of less than 0.05, CK-MB 2.2, myoglobin 52.7, lipase was 23.  CMP with a sodium of 134, potassium 4.5, chloride 95, CO2 of 29, blood sugar 167, BUN 19, creatinine 0.67, total bilirubin 0.9, alkaline phosphatase 60, AST 24, ALT 29, protein 6.5, albumin 3.4, calcium 9.1.  The patient had a CBC with differential showing white blood cells of 10.8, hemoglobin 13.8, platelets 163, MCV 83.8, absolute neutrophils count was 9.4.  RADIOLOGIC EXAMINATIONS: 1. The patient had a chest x-ray on the 26th that demonstrated     increased interstitial accentuation, favoring pulmonary venous     hypertension and borderline interstitial edema or atypical     pneumonia. 2. Cardiomegaly. 3. Small bilateral pleural effusions, left greater than right, with     suspected passive atelectasis in the left lower lobe. 4. Severe emphysema changes. 5. Biapical scarring, right greater than left. 6. Atherosclerotic calcification of the aortic arch. 7. Left rib deformities compatible with old fractures. 8. The patient also had a CT of the abdomen and pelvis that     demonstrated,     a.     Diffuse distention of the  small bowel loops throughout the      abdomen and pelvis with extensive air-fluid levels, compatible      with a small bowel obstruction.  The transition point is at the      patient's right inguinal hernia with a 3.1 cm segment of      incarcerated distal ileum.  This may be manually reducible or not.      Terminal ileum is decompressed.     b.     Trace ascites within the abdomen and pelvis surrounding a      small bowel loops, particularly in the pelvis.     c.     A small left pleural effusion with partial consolidation of      the left lower lobe.  Trace right-sided pleural effusion also      noted.     d.     Bilateral emphysema seen.     e.     Very mild left-sided hydronephrosis appears to be chronic  and is a stable from the prior study.  No definite etiology for      distal obstruction is appreciated.     f.     Diffuse coronary artery calcifications.     g.     Left renal cyst, again noted.     h.     Impression of the prostate on the base of the bladder.  The      prostate remains normal in size.     i.     Mild diffuse soft tissue stranding may reflect mild      anasarca.     j.     Left convex lumbar scoliosis with degenerative changes along      the lumbar spine.  No other images performed at this time.  ASSESSMENT AND PLAN: 1. If the patient has right incarcerated inguinal hernia and small     bowel obstruction, which will be the cause of the patient's     abdominal pain, nausea, vomiting, the patient is going to receive a     gentle fluid resuscitation.  He is going to go to the operating     room per Dr. [name] in order to have a surgical repair of the     patient's hernia and probably small bowel resection if require. 2. If the patient has hypertension, plan is to continue with the     patient's home medications.  At this point, it may be higher than     his usual normal blood pressure secondary to pain.  We are going     also to provide pain  medications. 3. If the patient has a history of peptic ulcer disease and     gastroesophageal reflux, plan is to continue using Protonix daily. 4. If the patient has atrial fibrillation, not a candidate for chronic     Coumadin use, at this moment, we are going to continue using     digoxin and also Coreg to continue providing rate control. 5. If the patient has hypothyroidism, which are going to continue     Synthroid 50 mcg by mouth daily and we are going to check a TSH. 6. If the patient has chronic obstructive pulmonary disease, currently     stable without any wheezing, we are going to continue Symbicort. 7. If the patient has coronary artery disease, plan is to continue     using Plavix. 8. If the patient has nausea and vomiting all secondary to his right     incarcerated inguinal hernia and small bowel obstruction, at this     point we are going to use Zofran p.r.n. to control his symptoms and     med rec have been already ordered in order to Pharmacy to confirm     the patient's medication and we are going to resume the meds     that he required to continue taking while he is in the hospital.     The rest of his problems are stable and did not require any changes     on medication.  The patient is do not resuscitate, I do not     resuscitate/no code blue deformed, has been signed and placed on     the chart.     [name], MD   CEM/MEDQ  D:  07/31/2010  T:  07/31/2010  Job:  [id number]  Electronically Signed by Vassie Loll MD on 08/11/2010 10:15:32 PM

## 2010-08-12 ENCOUNTER — Telehealth: Payer: Self-pay | Admitting: Internal Medicine

## 2010-08-12 NOTE — Telephone Encounter (Signed)
Spoke with [name] - can't locate doctor's name in echart - but i believe it was [facility] [employer] [facility] - i gave her that number. KIK

## 2010-08-12 NOTE — Telephone Encounter (Signed)
Pt was suppose to see surgeon today  For fup re: surgery had pt had 2 weeks ago, but apparently no appt has been made and pt was told by surgeon to come in 8 days after surgery for fup. Pts caregiver is wondering what she needs to do to sch?

## 2010-08-13 NOTE — Consult Note (Signed)
[name], [name] NO.:  [id number]  MEDICAL RECORD NO.:  [id number]           PATIENT TYPE:  E  LOCATION:  WLED                         FACILITY:  [facility]  PHYSICIAN:  Abigail Miyamoto, M.D. DATE OF BIRTH:  01/13/1918  DATE OF CONSULTATION: DATE OF DISCHARGE:  HISTORY & PHYSICAL     REFERRING PHYSICIAN:  ER physician, Lorelle Gibbs. Corey Duel, MD.  PRIMARY CARE:  Gordy Savers, MD  CHIEF COMPLAINT:  Abdominal pain, nausea, diarrhea.  BRIEF HISTORY:  The patient is a 77 year old gentleman who has chronic illnesses who developed nausea and vomiting and was brought to the ER by EMS.  He knows he has chronic constipation.  Workup in the ER included CT scan which shows some consolidation of left lower lobes and questionable pneumonia with left-sided hydronephrosis.  He has diffuse distention of his small bowel with 3.5 cm extensive obstruction, transition in the right inguinal hernia.  There is a 3.1 cm segment of the distal ileum which is incarcerated and he also has aortic coronary calcifications.  His bladder is slightly distended.  There was trace of ascites, emphysema, and scoliosis.  Chest x-ray shows cardiomegaly, small bilateral pleural effusions, some mild interstitial edema, biapical scarring, left rib deformity compatible with fractures, severe emphysema.  LABORATORY DATA:  Stool guaiac was positive.  CK and troponin were negative.  Lipase was 23.  Sodium is 134, potassium is 4.5, chloride is 95, CO2 is 29, BUN is 19, creatinine is 0.67, glucose 167.  LFTs were normal.  White count 10.8, hemoglobin is 13.8, hematocrit is 42.5, platelets are 163,000.  He is being admitted by the hospitalist.  We were contacted and asked to see the patient.  He was examined by Dr. [name].  He examined the patient and he was found to have obvious inguinal hernia defect on the right; with bowel which is hard and tender in the right inguinal space.  It was his opinion  the patient needs to be taken directly to the OR as soon as possible for repair of incisional hernia and possible small bowel resection.  The risks and benefits were discussed with the patient in detail and informed consent was obtained. He lives with his wife who is demented, does not appear to have any children closeby.  PAST MEDICAL HISTORY: 1. He was hospitalized January 17 through January 18 with nausea,     vomiting, constipation, fecal impaction. 2. History of AF, coronary artery disease and congestive heart     failure. 3. COPD, on home O2. 4. Hypothyroid. 5. GERD. 6. Peripheral vascular occlusive disease. 7. Questionable history of CVA.  A 2-D echo, April 04, 2009, shows     an EF of 55-60%, mild aortic stenosis, AR, and mild MR.  PAST SURGICAL HISTORY: 1. Appendectomy. 2. Prior inguinal hernia repair. 3. Tonsils and adenoids. 4. Abdominal aortic aneurysm repair. 5. TURP. 6. History of cataract surgery. 7. Lumbar laminectomy in 1967, 1977, 1998.  FAMILY HISTORY:  Father died of TB at 73.  Mother died with MI at 46. He has 2 sisters, both are deceased and have a history of dementia, renal and breast cancer.  SOCIAL HISTORY:  He is a former smoker, quit 60 years ago.  Alcohol, none.  Drugs, none.  He is a retired Printmaker.  He lives with his wife who has dementia and they live at home with help.(Later found out wife is in SNF)  REVIEW OF SYSTEMS:  WEIGHT:  The patient says his weight is stable but he cannot exceedingly.  PSYCH:  No changes.  PULMONARY:  He has chronic shortness of breath.  He sleeps in a chair and bed.  He has a chronic cough, yellow productive sputum, he is on O2 all the time.  GI; Positive for GERD.  Positive for nausea and vomiting.  Positive for constipation, better part of 2 months.  GU:  No trouble voiding.  LOWER EXTREMITIES:  Positive for claudication.  No edema.  MUSCULOSKELETAL: Positive for arthritis.  CURRENT MEDICATIONS:  This is  from the discharge on April 23, 2010, includes: 1. Levothyroxine 50 mcg daily. 2. Prilosec 20 mg daily. 3. Ativan 0.5 mg b.i.d. p.r.n. sublingual. 4. Nitroglycerin p.r.n. 5. Symbicort 80/1.5 two puffs b.i.d. 6. Atrovent nebulizers t.i.d. p.r.n. 7. Lasix 40 mg tablets 1/2 tablet Monday, Wednesday and Friday. 8. Flomax 0.4 mg daily. 9. KCl 10 mEq daily. 10.Coreg 3.125 p.o. b.i.d. 11.Tramadol p.o. q.6 h p.r.n. 12.Avodart 0.5 daily. 13.Fentanyl patch 25 mcg q.72 h. 14.Plavix 75 mg daily. 15.MiraLax 17 g p.o. daily. 16.Digoxin 0.125 mg daily.  PHYSICAL EXAMINATION:  GENERAL:  This is a thin, cachectic white male, in no acute distress. VITAL SIGNS:  His blood pressure this morning on admission at 2:30, heart rate was 113, blood pressure 165/102, respiratory rate is 17, sats are 99% on 2 L nasal cannula.  Currently, heart rate is 115, blood pressure is 146/100, respiratory rate is 20. HEENT:  Head:  Normocephalic.  Ears, nose and mouth are grossly normal. He is a edentulous.  Pupils are equal and reactive to light. NECK:  No bruits.  No JVD.  Thyroid is not palpable. CARDIAC:  Normal S1, tachycardic, slightly irregular.  Pulses are decreased in distal lower extremities. ABDOMEN:  Bowel sounds are somewhat diminished.  Abdomen is distended. It is not really tender.  The right inguinal hernia is present which is hard and tight.  There are no abscesses.  There is well-healed midline surgical incision.  No masses. GU/RECTAL:  Deferred. LYMPHADENOPATHY:  None palpated. MUSCULOSKELETAL:  No acute abnormalities. SKIN:  No changes. NEUROLOGIC:  Cranial nerves are intact.  There are no focal deficits. PSYCH:  Normal affect.  IMPRESSION: 1. Incarcerated right inguinal hernia to. 2. History of atrial fibrillation, coronary artery disease, congestive     heart failure. 3. Chronic obstructive pulmonary disease, on home oxygen. 4. Hypothyroid. 5. Gastroesophageal reflux disease. 6.  Malnutrition. 7. Peripheral vascular occlusive disease. 8. History of abdominal aortic aneurysm repair. 9. Questionable history of cerebrovascular accident.  PLAN:  He has been seen and evaluated by Dr. [name] with plan to take him to the OR as soon as room is available.  Further treatment as indicated.     Eber Hong, P.A.   ______________________________ Abigail Miyamoto, M.D.    WDJ/MEDQ  D:  07/31/2010  T:  07/31/2010  Job:  [id number]  Electronically Signed by Sherrie George P.A. on 08/11/2010 03:25:00 PM Electronically Signed by Abigail Miyamoto M.D. on 08/13/2010 10:37:06 AM

## 2010-08-14 ENCOUNTER — Emergency Department (HOSPITAL_COMMUNITY): Payer: Medicare Other

## 2010-08-14 ENCOUNTER — Inpatient Hospital Stay (HOSPITAL_COMMUNITY)
Admission: EM | Admit: 2010-08-14 | Discharge: 2010-08-17 | DRG: 193 | Disposition: A | Discharge status: Skilled Nursing Facility | Payer: Medicare Other | Attending: Internal Medicine | Admitting: Internal Medicine

## 2010-08-14 DIAGNOSIS — Z79899 Other long term (current) drug therapy: Secondary | ICD-10-CM

## 2010-08-14 DIAGNOSIS — E039 Hypothyroidism, unspecified: Secondary | ICD-10-CM | POA: Diagnosis present

## 2010-08-14 DIAGNOSIS — I4891 Unspecified atrial fibrillation: Secondary | ICD-10-CM | POA: Diagnosis present

## 2010-08-14 DIAGNOSIS — Z7902 Long term (current) use of antithrombotics/antiplatelets: Secondary | ICD-10-CM

## 2010-08-14 DIAGNOSIS — J189 Pneumonia, unspecified organism: Principal | ICD-10-CM | POA: Diagnosis present

## 2010-08-14 DIAGNOSIS — J9620 Acute and chronic respiratory failure, unspecified whether with hypoxia or hypercapnia: Secondary | ICD-10-CM | POA: Diagnosis present

## 2010-08-14 DIAGNOSIS — I10 Essential (primary) hypertension: Secondary | ICD-10-CM | POA: Diagnosis present

## 2010-08-14 DIAGNOSIS — J441 Chronic obstructive pulmonary disease with (acute) exacerbation: Secondary | ICD-10-CM | POA: Diagnosis present

## 2010-08-14 DIAGNOSIS — Z8673 Personal history of transient ischemic attack (TIA), and cerebral infarction without residual deficits: Secondary | ICD-10-CM

## 2010-08-14 DIAGNOSIS — Y834 Other reconstructive surgery as the cause of abnormal reaction of the patient, or of later complication, without mention of misadventure at the time of the procedure: Secondary | ICD-10-CM | POA: Diagnosis present

## 2010-08-14 DIAGNOSIS — Z87891 Personal history of nicotine dependence: Secondary | ICD-10-CM

## 2010-08-14 DIAGNOSIS — I2510 Atherosclerotic heart disease of native coronary artery without angina pectoris: Secondary | ICD-10-CM | POA: Diagnosis present

## 2010-08-14 DIAGNOSIS — IMO0002 Reserved for concepts with insufficient information to code with codable children: Secondary | ICD-10-CM | POA: Diagnosis present

## 2010-08-14 DIAGNOSIS — Y92009 Unspecified place in unspecified non-institutional (private) residence as the place of occurrence of the external cause: Secondary | ICD-10-CM

## 2010-08-14 DIAGNOSIS — E43 Unspecified severe protein-calorie malnutrition: Secondary | ICD-10-CM | POA: Diagnosis present

## 2010-08-14 DIAGNOSIS — K219 Gastro-esophageal reflux disease without esophagitis: Secondary | ICD-10-CM | POA: Diagnosis present

## 2010-08-14 LAB — CBC
HCT: 38.5 % — ABNORMAL LOW (ref 39.0–52.0)
Hemoglobin: 12.4 g/dL — ABNORMAL LOW (ref 13.0–17.0)
MCH: 26.9 pg (ref 26.0–34.0)
MCHC: 32.2 g/dL (ref 30.0–36.0)
MCV: 83.5 fL (ref 78.0–100.0)
Platelets: 226 10*3/uL (ref 150–400)
RBC: 4.61 MIL/uL (ref 4.22–5.81)
RDW: 13.9 % (ref 11.5–15.5)
WBC: 11.8 10*3/uL — ABNORMAL HIGH (ref 4.0–10.5)

## 2010-08-14 LAB — BASIC METABOLIC PANEL
BUN: 15 mg/dL (ref 6–23)
Calcium: 9.2 mg/dL (ref 8.4–10.5)
Creatinine, Ser: <0.47 mg/dL (ref 0.4–1.5)
Glucose, Bld: 113 mg/dL — ABNORMAL HIGH (ref 70–99)

## 2010-08-14 LAB — BASIC METABOLIC PANEL WITH GFR
CO2: 35 meq/L — ABNORMAL HIGH (ref 19–32)
Chloride: 92 meq/L — ABNORMAL LOW (ref 96–112)
Potassium: 4.2 meq/L (ref 3.5–5.1)
Sodium: 133 meq/L — ABNORMAL LOW (ref 135–145)

## 2010-08-14 LAB — APTT: aPTT: 28 seconds (ref 24–37)

## 2010-08-14 LAB — URINALYSIS, ROUTINE W REFLEX MICROSCOPIC
Bilirubin Urine: NEGATIVE
Glucose, UA: NEGATIVE mg/dL
Hgb urine dipstick: NEGATIVE
Ketones, ur: NEGATIVE mg/dL
Nitrite: NEGATIVE
Protein, ur: NEGATIVE mg/dL
Specific Gravity, Urine: 1.01 (ref 1.005–1.030)
Urobilinogen, UA: 1 mg/dL (ref 0.0–1.0)
pH: 7.5 (ref 5.0–8.0)

## 2010-08-14 LAB — PROCALCITONIN: Procalcitonin: <0.1 ng/mL

## 2010-08-14 LAB — POCT I-STAT 3, ART BLOOD GAS (G3+)
Acid-Base Excess: 8 mmol/L — ABNORMAL HIGH (ref 0.0–2.0)
pO2, Arterial: 117 mmHg — ABNORMAL HIGH (ref 80.0–100.0)

## 2010-08-14 LAB — DIFFERENTIAL
Basophils Absolute: 0 10*3/uL (ref 0.0–0.1)
Basophils Relative: 0 % (ref 0–1)
Eosinophils Absolute: 0.1 10*3/uL (ref 0.0–0.7)
Eosinophils Relative: 1 % (ref 0–5)
Lymphocytes Relative: 8 % — ABNORMAL LOW (ref 12–46)
Lymphs Abs: 0.9 K/uL (ref 0.7–4.0)
Monocytes Absolute: 0.8 K/uL (ref 0.1–1.0)
Monocytes Relative: 7 % (ref 3–12)
Neutro Abs: 10 K/uL — ABNORMAL HIGH (ref 1.7–7.7)
Neutrophils Relative %: 85 % — ABNORMAL HIGH (ref 43–77)

## 2010-08-14 LAB — PROTIME-INR
INR: 1.12 (ref 0.00–1.49)
Prothrombin Time: 14.6 seconds (ref 11.6–15.2)

## 2010-08-14 LAB — LACTIC ACID, PLASMA: Lactic Acid, Venous: 1.1 mmol/L (ref 0.5–2.2)

## 2010-08-15 ENCOUNTER — Inpatient Hospital Stay (HOSPITAL_COMMUNITY): Payer: Medicare Other

## 2010-08-15 LAB — URINE CULTURE
Colony Count: NO GROWTH
Culture  Setup Time: 201205101922
Culture: NO GROWTH

## 2010-08-15 LAB — COMPREHENSIVE METABOLIC PANEL
ALT: 8 U/L (ref 0–53)
Albumin: 2.2 g/dL — ABNORMAL LOW (ref 3.5–5.2)
Alkaline Phosphatase: 56 U/L (ref 39–117)
BUN: 17 mg/dL (ref 6–23)
Calcium: 8.6 mg/dL (ref 8.4–10.5)
Potassium: 4.2 mEq/L (ref 3.5–5.1)
Sodium: 135 mEq/L (ref 135–145)
Total Protein: 5.2 g/dL — ABNORMAL LOW (ref 6.0–8.3)

## 2010-08-15 LAB — CBC
Platelets: 212 10*3/uL (ref 150–400)
RDW: 14 % (ref 11.5–15.5)
WBC: 8.9 10*3/uL (ref 4.0–10.5)

## 2010-08-15 LAB — TSH: TSH: 1.943 u[IU]/mL (ref 0.350–4.500)

## 2010-08-15 LAB — GLUCOSE, CAPILLARY: Glucose-Capillary: 141 mg/dL — ABNORMAL HIGH (ref 70–99)

## 2010-08-15 NOTE — H&P (Signed)
NAME:  [name], GELLES NO.:  [id number]  MEDICAL RECORD NO.:  [id number]           PATIENT TYPE:  E  LOCATION:  [facility]                         FACILITY:  [facility]  PHYSICIAN:  Marinda Elk, M.D.DATE OF BIRTH:  09/09/1917  DATE OF ADMISSION:  08/14/2010 DATE OF DISCHARGE:                             HISTORY & PHYSICAL   PRIMARY CARE DOCTOR:  Gordy Savers, MD [facility].  CHIEF COMPLAINT:  Shortness of breath and productive cough for the past.  HISTORY OF PRESENT ILLNESS:  This is a 77 year old male with past medical history of COPD, currently on home O2, also hypothyroidism. Recently, discharged from the hospital on Aug 05, 2010, for a recurrent incarcerated hernia, also history of AFib and malnutrition that comes in for cough and shortness of breath for the past 2 days, progressively getting worse.  He relates he has been having this cough and shortness of breath that has progressively gotten worse to the point where he cannot breathe.  When EMS saw him at that time, he was satting 83% on 2 L.  So, he decided to bring him to the ED.  Here in the ED, he was put on BiPAP and giving breathing treatments and steroids, and he improved. Now, he is on nasal cannula.  Breathing about 20 times per minute, but unable to speak in full sentences.  Now, he is able to speak in full sentences.  He relates no nausea, vomiting, or diarrhea, but he does relate fevers.  ALLERGIES:  PROCAINE and NITROFURANTOIN.  PAST MEDICAL HISTORY: 1. History of incarcerated hernia status post surgery on Aug 05, 2010.     Previous surgery before this on July 31 1010. 2. History of acute COPD exacerbation, requiring intubation. 3. Chronic AFib, currently not on Coumadin. 4. Hypothyroidism. 5. GERD. 6. Peripheral vascular disease. 7. History of CVA . 8. History of benign prostatic hypertrophy. 9. History of DJD. 10.History of peptic ulcer. 11.History of spinal  stenosis.  MEDICATIONS:  He is on; 1. Albuterol 2.5 inhaled t.i.d. 2. Avodart 0.5 mg p.o. daily. 3. Cormax topical apply b.i.d. 4. Coreg 6.25 mg p.o. b.i.d. 5. Septic spray q.4 h. as needed. 6. Digoxin 0.125 mg p.o. every morning. 7. Lasix 40 mg daily on Monday, Wednesday, and Friday. 8. Atrovent 0.5 to 2.5 mL solution t.i.d. 9. Klor-Con 10 mEq daily. 10.Synthroid 50 mcg daily. 11.Lorazepam 0.5 mg b.i.d. 12.MiraLax 17 g daily. 13.Nitroglycerin sublingual 0.5 mg every 5 minutes x3 doses as needed. 14.Protonix 40 mg daily. 15.Plavix 75 mg daily. 16.Saline packet b.i.d. 17.Symbicort 80/4.5 mcg inhaled puff b.i.d. 18.Tamsulosin 0.4 mg daily. 19.Tramadol 50 mg every 6 hours p.r.n.  SOCIAL HISTORY:  The patient quit smoking for about 30 years.  He is married, currently lives in ALF, spent a lot of time over [facility].  FAMILY HISTORY:  Father died at the age of 16 of TB.  Mother died of 59 of heart attack.  Has 2 sisters, positive for dementia and breast cancer.  REVIEW OF SYSTEMS:  A 10-point review of system done, pertinent positive for HPI.  PHYSICAL EXAMINATION:  VITAL SIGNS:  Temperature 101.8, heart rate of 69,  blood pressure 153/80, breathing 20 times per minute, and satting 99% on BiPAP, then switched to nasal cannula and satting 97% on 2 liters. GENERAL:  He is awake, alert, and oriented x3, able to speak in full sentences, cachectic male. HEENT:  Dry mucous membrane.  No JVD.  No bruits.  No thyromegaly. Anicteric.  No pallor. CARDIOVASCULAR:  He has regular rate and rhythm with positive S1 and S2. No murmurs, rubs, or gallops. ABDOMEN:  Positive bowel sounds, nontender, and nondistended.  Soft. LUNGS:  He has moderate movement and clear to auscultation except on the right lung base, which shows some crackles. EXTREMITIES:  Extremities thin.  Positive pulses.  No edema. SKIN:  No rashes or ulceration.  Lymphadenopathy nonpalpable. NEUROLOGIC:  Awake, alert, and  oriented x4.  Coherent and fluent language and nonfocal.  LABORATORY DATA:  Labs on admission show procalcitonin less than 0.10. UA negative.  Lactic acid 1.1.  Sodium 133, potassium 4.2, chloride 92, bicarb of 35, glucose 113, BUN of 15, creatinine 0.7, and calcium 9.2. PTT of 28, PT 14.6, and INR 1.2.  His white count is 11.8 with an ANC of 10.0, hemoglobin of 12.4 with an MCV of 83.  His blood gas shows a pH of 7.42, pCO2 of 52, and pO2 of 117.  Chest x-ray showed focal opacity at the right lung base.  ASSESSMENT AND PLAN: 1. Healthcare-associated right lower lobe pneumonia.  We will get     sputum cultures continue vanc and Zosyn.  He was discharged from the     hospital less than 2 weeks ago.  We will check a urine Legionella     antigen. 2. Acute on chronic hypercarbic respiratory failure, probably     secondary to chronic obstructive pulmonary disease, now off BiPAP.     We will start him on Solu-Medrol, placed on nasal cannula.     Continue albuterol treatment.  Monitor O2 sats noted to maintain     above 88%.  His pH actually not changed.  It is 7.42, so it seems     to be corrected.  His bicarb is slightly increased, so we will     continue to monitor. He is breathing more comfortable now sat >90%     on 2 l. 3. Acute chronic obstructive pulmonary disease exacerbation on 2 to 4     liters.  We will start him on steroids, antibiotics, and inhaler.     We will monitor sats.  His bicarb is worse, but when he was     discharged from the hospital last time it was 29, today is 61.     Probably worsening for chronic obstructive pulmonary disease, we     will continue to monitor. 4. History of hypothyroidism.  Lytes and TSH was checked, it was     really high on Aug 08, 2010.  I will increase his insulin to 75.     Continue his Synthroid at 75 and will be followed up in 6 weeks. 5. Severe protein malnutrition.  We will do Ensure t.i.d.     Marinda Elk,  M.D.     AF/MEDQ  D:  08/14/2010  T:  08/14/2010  Job:  [id number]  cc:   Gordy Savers, MD  Electronically Signed by Marinda Elk M.D. on 08/15/2010 06:47:39 AM

## 2010-08-16 LAB — BASIC METABOLIC PANEL
BUN: 22 mg/dL (ref 6–23)
CO2: 36 mEq/L — ABNORMAL HIGH (ref 19–32)
Chloride: 99 mEq/L (ref 96–112)
Creatinine, Ser: <0.47 mg/dL (ref 0.4–1.5)
Glucose, Bld: 130 mg/dL — ABNORMAL HIGH (ref 70–99)
Potassium: 3.9 mEq/L (ref 3.5–5.1)

## 2010-08-16 LAB — CBC
HCT: 30.6 % — ABNORMAL LOW (ref 39.0–52.0)
Hemoglobin: 9.9 g/dL — ABNORMAL LOW (ref 13.0–17.0)
MCH: 27.1 pg (ref 26.0–34.0)
MCV: 83.8 fL (ref 78.0–100.0)
RBC: 3.65 MIL/uL — ABNORMAL LOW (ref 4.22–5.81)

## 2010-08-17 LAB — GLUCOSE, CAPILLARY

## 2010-08-19 NOTE — H&P (Signed)
Corey, [name] NO.:  [id number]   MEDICAL RECORD NO.:  [id number]          PATIENT TYPE:  EMS   LOCATION:  ED                           FACILITY:  [facility]   PHYSICIAN:  Hollice Espy, M.D.DATE OF BIRTH:  1917-07-25   DATE OF ADMISSION:  08/20/2008  DATE OF DISCHARGE:                              HISTORY & PHYSICAL   PRIMARY CARE PHYSICIAN:  Corey Savers, MD.   CONSULTANTS ON THIS CASE:  Deanna Artis. Velda Shell, M.D., Neurology.   CHIEF COMPLAINT:  Trouble speaking.   HISTORY OF PRESENT ILLNESS:  The patient is an 76 year old white male  with past medical history of paroxysmal atrial fibrillation, COPD and  systolic CHF who was actually just discharged from the [facility] approximately 11 days ago for a resolving intestinal  obstruction.  He otherwise did well but then for the last 3 or 4 days he  noticed that he had some trouble speaking.  His symptoms did not improve  over the course of the weekend so he called his PCP who advised him to  come to the hospital to be checked out.  In the emergency room the  patient underwent a CT scan of the head which noted a new left internal  capsule hypodensity, question acute to subacute infarct, recommended  MRI.  The patient had a followup MRI in the emergency room noting acute  bland infarct affecting the left internal capsule and posterior putamen.  With these findings Neurology was consulted.  Because of the patient's 4-  day symptomatology thi was felt to be subacute CVA and no acute  thrombolytic intervention was warranted.  In the meantime, the patient  himself was otherwise doing well.  He said that he had not noted any  choking problems.  His speech actually he felt like it was coming and  going.  He had a friend with him who said that she had not noted any  type of confusion and he said he has no focal extremity weakness one  side or the other, upper or lower extremity.  He is  otherwise doing  well.  He denies any headaches, vision changes, dysphagia, chest pain,  palpitations, nausea, vomiting, wheeze, cough, abdominal pain,  hematuria, dysuria, constipation, diarrhea, focal extremity numbness,  weakness or pain.  His review of systems is otherwise negative.   PAST MEDICAL HISTORY:  Includes:  1. Recent ileus versus small bowel obstruction,  resolving.  2. COPD.  3. Paroxysmal AFib.  4. Systolic CHF with an ejection fraction around 35% from an echo done      in April of 2010.  5. He has history of hypothyroidism.  6. Hypertension.   MEDICATIONS:  The patient is on:  1. MiraLax daily.  2. Zoloft 25.  3. K-Dur 20 mEq.  4. Lisinopril 10.  5. Lasix 40 p.r.n. only for feet swelling.  6. Atrovent b.i.d.  7. Aspirin daily 81 mg.  8. Coreg 6.25 p.o. b.i.d.  9. Symbicort two puff b.i.d.  10.Nitrorex 0.4 mg sublingual p.r.n.  11.Plavix 75.  12.Albuterol daily.  13.Ativan 0.5 p.o. b.i.d.  14.Flomax 0.4 daily.  15.Prilosec 20.  16.Synthroid 50 mcg.   ALLERGIES:  THE PATIENT HAS ALLERGIES TO DIGOXIN, NITROFURANTOIN AND  PROCAINE.   SOCIAL HISTORY:  No tobacco, alcohol or drug use.   FAMILY HISTORY:  Noncontributory.   PHYSICAL EXAM:  PATIENT'S VITALS ON ADMISSION:  Heart rate 54, blood  pressure 115/45, respirations 16, O2 saturation 95% on room air.  GENERAL:  He is alert and oriented x3, in no apparent distress.  HEENT:  Normocephalic, atraumatic.  Mucous membranes slightly dry.  He  has no carotid bruits.  Cranial nerves II-XII are intact.  HEART:  Is an irregular rhythm, but rate controlled.  LUNGS:  Clear to auscultation bilaterally.  ABDOMEN:  Soft, nontender, nondistended.  Positive bowel sounds.  EXTREMITIES:  Show no clubbing, cyanosis or edema.  His left upper  extremity is in an elbow cast which he has had now for close to a year  secondary to a fall with poorly healing elbow fracture.  SKIN:  Shows signs of age but no acute tears pr  wounds.  NEUROLOGICAL EXAM:  Downgoing Babinski's.  Signs of chronic rheumatoid  arthritis.  The patient as well is cachectic.   LABORATORY WORK:  CT scan and MRI as per HPI.  Chest x-ray notes  improving left lower lobe airspace disease and left pleural effusion.  Sodium 132, potassium 4.2, chloride 98, bicarb 32, BUN 20, creatinine  0.6, glucose 104.  White count 6.9, no shift.  H and H 13 and 38, MCV of  86, platelet count 319.  Coags unremarkable.  Urinalysis negative.   ASSESSMENT/PLAN:  1. New cerebrovascular accident.  The patient is already on aspirin      and Plavix although we can increase the aspirin from 81 to 325.      Check Dopplers and an echocardiogram.  The question is that with      him being in atrial fibrillation, should he be on Coumadin.  We      will check a physical therapy, occupational therapy evaluation and      get an idea of his fall risk.  The patient is actually quite well      managed on his medications.  Have an open discussion about whether      or not he should start Coumadin, can be determined.  2. History of chronic obstructive pulmonary disease, stable.  3. History of systolic congestive heart failure with 35% ejection      fraction, currently stable.  No signs of acute congestive heart      failure.  4. History of hypothyroidism.      Hollice Espy, M.D.  Electronically Signed     SKK/MEDQ  D:  08/20/2008  T:  08/20/2008  Job:  [id number]   cc:   Deanna Artis. Sharene Skeans, M.D.  Fax: [phone]   Corey Savers, MD  [address] [place]  [place] [address]

## 2010-08-19 NOTE — Discharge Summary (Signed)
[name], [name]         ACCOUNT NO.:  [id number]   MEDICAL RECORD NO.:  [id number]          PATIENT TYPE:  INP   LOCATION:  6712                         FACILITY:  [facility]   PHYSICIAN:  Valetta Mole. Swords, MD    DATE OF BIRTH:  12-12-1917   DATE OF ADMISSION:  07/06/2008  DATE OF DISCHARGE:                               DISCHARGE SUMMARY   DISCHARGE DIAGNOSES:  1. Pneumonia.  2. Chronic obstructive pulmonary disease exacerbation.  3. Chronic atrial fibrillation.  4. Acute on chronic heart failure.  5. Chronic atrial fibrillation.  6. Hypertension.  7. Coronary artery disease.  8. Hypothyroidism.   DISCHARGE MEDICATIONS:  See medication reconciliation form.   FOLLOWUP PLANS:  Gordy Savers, MD, in 1-2 weeks.   CONDITION ON DISCHARGE:  Improved.   HOSPITAL PROCEDURES:  1. Chest x-ray on July 06, 2008, demonstrated right greater than left      pneumonia.  2. Chest x-ray on July 10, 2008, demonstrated progression of diffuse      bilateral air space disease.  3. Echocardiogram.  Results:  Left ventricular size is normal with      depressed ejection fraction estimated at 35%.  Aortic valve appears      restricted, but Doppler interrogation demonstrates no significant      aortic stenosis and moderate left atrial enlargement and moderate      to severe right atrial enlargement.  There is significantly      increased RV filling pressures with a dilated IVC.   HOSPITAL LABORATORY DATA:  CBC on discharge with a hemoglobin of 12.8,  platelet count of 146,000.  BMET at discharge, sodium 133, potassium  3.5, and glucose 106.  BNP on July 09, 2008, was 714.  Blood cultures  negative.   HOSPITAL COURSE:  The patient admitted on July 06, 2008.  See Dr. Yetta Barre'  admission note.  1. Pneumonia:  The patient with long history of O2-dependent COPD      admitted with pneumonia.  Treated aggressively with antibiotics and      steroids.  He will be discharged on a tapering dose of  steroids and      continuous home oxygen and nebulizers.  2. Cardiac:  The patient with chronic atrial fibrillation, not an      anticoagulation candidate.  Continue aspirin.  3. Acute on chronic congestive heart failure:  The patient's      furosemide was increased.  He states that the increased furosemide      was responsible for his improvement.  He will be discharged on a      replacement dose of potassium chloride.  The patient was started on      low-dose lisinopril due to depressed LV function.  4. Fluids, electrolytes, and nutrition, hypokalemia, resolved after      replacement.  He will go home on replacement dose of potassium due      to increased dose of furosemide.  This may need to be followed      given utilization of ACE inhibitor.  5. Other medical problems stable.   All the patient's questions were answered at  the time of discharge.  Greater than 30 minutes spent on discharge planning.      Bruce Rexene Edison Swords, MD  Electronically Signed     BHS/MEDQ  D:  07/13/2008  T:  07/13/2008  Job:  [id number]

## 2010-08-19 NOTE — Discharge Summary (Signed)
[name], [name]         ACCOUNT NO.:  [id number]  MEDICAL RECORD NO.:  [id number]           PATIENT TYPE:  I  LOCATION:  1403                         FACILITY:  [facility]  PHYSICIAN:  Ardeth Sportsman, MD     DATE OF BIRTH:  1917/09/02  DATE OF ADMISSION:  07/31/2010 DATE OF DISCHARGE:  08/05/2010                              DISCHARGE SUMMARY   ADMISSION DIAGNOSES: 1. Recurrent incarcerated hernia on the right. 2. History of atrial fibrillation. 3. Coronary artery disease. 4. History of congestive heart failure. 5. Chronic obstructive pulmonary disease, on home oxygen. 6. Hypothyroid. 7. Chronic constipation. 8. Gastroesophageal reflux disease. 9. Malnutrition. 10.Peripheral vascular occlusive disease. 11.History of abdominal aortic aneurysm repair. 12.Questionable history of transient ischemic attack/cerebrovascular     accident.  DISCHARGE DIAGNOSES: 1. Recurrent incarcerated hernia on the right. 2. History of atrial fibrillation. 3. Coronary artery disease. 4. History of congestive heart failure. 5. Chronic obstructive pulmonary disease, on home oxygen. 6. Hypothyroid. 7. Chronic constipation. 8. Gastroesophageal reflux disease. 9. Malnutrition. 10.Peripheral vascular occlusive disease. 11.History of abdominal aortic aneurysm repair. 12.Questionable history of transient ischemic attack/cerebrovascular     accident.  PROCEDURES: 1. CT of the abdomen and pelvis on April 23, 2010, which shows     diffuse distention of the small bowel loops throughout the abdomen.     Pelvis with extensive air-fluid level is compatible with small-     bowel obstruction.  Transition point is in the right inguinal     hernia with a 3.1-cm segment of incarcerated distal ileum.  Trace     ascites.  Small left effusion and trace right pleural effusion. 2. Bilateral emphysema. 3. Mild left-sided hydronephrosis and diffuse coronary artery     calcifications. 4. Repair of  incarcerated right inguinal hernia with mesh (Parietex     ProGrip Prolene mesh on July 31, 2010, Dr. [name]).  BRIEF HISTORY:  The patient is a 77 year old gentleman who presented to the ER at [facility] with acute abdominal pain.  He has a history of known constipation and workup found he had a recurrent left inguinal hernia.  On exam, this was clearly evident that he had a hard knuckle of bowel, which was palpable on physical exam with right inguinal hernia.  The patient was tender and after review by Dr. [name], it was his opinion that the patient should be admitted for surgical repair of his incarcerated right inguinal hernia as soon as possible.  At that point, we had no idea whether the bowel was viable or had infarcted.  PAST MEDICAL HISTORY: 1. He was hospitalized from January 17 to April 23, 2010, with     nausea, vomiting, constipation, fecal impaction. 2. History of Afib. 3. History of coronary artery disease/congestive heart failure. 4. COPD, on oxygen. 5. Hypothyroid. 6. GERD. 7. Peripheral vascular occlusive disease. 8. Question of history of CVA. 9. 2-D echo on April 04, 2009, shows an EF of 55% to 60% with mild     aortic stenosis, aortic regurgitation, and mild MR.  For further history and physical, please see the dictated note.  HOSPITAL COURSE:  The patient was admitted by  [facility] and seen in consultation by Dr. [name] to help with medical management of the medical problems listed above.  As soon as the OR was ready, he was taken directly to the OR for surgical repair of his incarcerated right inguinal hernia as described above.  He tolerated this procedure well and returned to the Postanesthesia Care Unit and then Intensive Care Unit in satisfactory condition.  First postoperative day, he was doing much better, minimal NG drainage.  His chest was clear.  His abdomen was fairly soft and he had some flatus, but no  bowel movements.  He was started on sips of clear liquids.  The second day, he continued to do well.  He was followed in the Intensive Care Unit by the Hospitalist.  He was transferred to the floor on the second postoperative day and developed some mild heart failure and was treated with Lasix.  Over the next couple of days, he continued to do well.  His heart failure was treated with IV Lasix.  PT and OT was asked to see the patient.  He developed some swelling in his right groin incision on August 03, 2010, in the evening and was seen by Dr. [name].  It was his opinion that the patient had a hematoma and his Lovenox (DVT propylaxis) was held at that point.  He has continued to make slow steady progress.  Plans have been made for the patient to go to rehab.  Case manager has worked that out for him to go to [facility] which is where his wife is located.  Apparently, they have arranged for him to be in the same room with her.  She has a history of dementia.  At this point, the patient is eating a regular diet.  He was constipated, but today, he has had 2 bowel movements.  He is mobilizing somewhat with OT and PT.  From medical standpoint, he is being cleared.  His groin wound is healing nicely.  The hematoma seems stable.  At this point, it was Dr. [name]' opinion that the patient be transferred to the skilled nursing facility with his wife.  DISCHARGE MEDICATIONS: 1. Albuterol 25 mg t.i.d. and p.r.n. 2. Carmex ointment for his lips. 3. Coreg has been increased to 6.25 mg b.i.d. and he has a new     prescription for 1 month. 4. Chloraseptic spray can be continued p.r.n. for sore throat. 5. Protonix 40 mg daily. 6. Psyllium 1 packet b.i.d. 7. Avodart 0.5 mg daily. 8. Digoxin 0.125 mg daily. 9. Lasix 40 mg half tablet by mouth Monday, Wednesday and Friday his     preadmission dose. 10.DuoNeb t.i.d. p.r.n. 11.Potassium 10 mEq 1 tablet daily. 12.Levothyroxine  50 mcg 1 daily. 13.Lorazepam 0.5 mg b.i.d. p.r.n. 14.MiraLax b.i.d. p.r.n. 15.Nitroglycerin sublingual p.r.n. for chest pain. 16.Plavix 75 mg daily. 17.Symbicort 2 puffs b.i.d. 18.Flomax 0.4 mg daily. 19.Tramadol 50 mg 1 tablet q.6 h. p.r.n. for pain. 20.Cipro, Prilosec and Coreg, the original doses were discontinued.  DISCHARGE ACTIVITY:  The patient to continue OT and PT.  He is on chronic home O2 and will need to continue his home O2 at 2 to 3 L for saturations of 90% or better. FOLLOWUP:  He will follow up with Dr. [name] on Friday, May 11, at 1:30.  His wounds are Steri-Stripped, he can shower. No bathing until wound is completely healed. Follow up by Dr. Amador Cunas, his primary care, and/or the in- house physician for the  skilled nursing facility to be scheduled by them.  CONDITION ON DISCHARGE:  Improving.     Eber Hong, P.A.   ______________________________ Ardeth Sportsman, MD    WDJ/MEDQ  D:  08/05/2010  T:  08/05/2010  Job:  [id number]  cc:   Gordy Savers, MD [address] [place] [address]  [name], MD  Electronically Signed by Sherrie George P.A. on 08/11/2010 03:31:12 PM Electronically Signed by Karie Soda MD on 08/19/2010 12:03:51 PM

## 2010-08-19 NOTE — Discharge Summary (Signed)
NAMEQUENCY, TOBER         ACCOUNT NO.:  [id number]   MEDICAL RECORD NO.:  [id number]          PATIENT TYPE:  INP   LOCATION:  1405                         FACILITY:  [facility]   PHYSICIAN:  Rosalyn Gess. Norins, MD  DATE OF BIRTH:  1917-04-20   DATE OF ADMISSION:  08/06/2008  DATE OF DISCHARGE:  08/09/2008                               DISCHARGE SUMMARY   ADMITTING DIAGNOSES:  1. Unspecified intestinal obstruction.  2. History of atrial fibrillation.  3. Chronic systolic heart failure.  4. Chronic obstructive pulmonary disease.  5. Hypothyroid disease.  6. Hypertension.   DISCHARGE DIAGNOSES:  1. Intestinal obstruction, resolved spontaneously.  Numbers 2 through 6 the same.   CONSULTANTS:  None.   PROCEDURES:  1. Acute abdominal film May 3 which showed mild dilated loops of small      bowel with air fluid levels suggesting ileus or partial small-bowel      obstruction.  No free air.  2. Follow-up film May 4 which showed interval resolution of dilated      loops of small bowel with no acute abnormality.  3. CT scan of the abdomen and pelvis, abdomen showing no evidence of      bowel obstruction or intra-abdominal abscess.  Left greater than      right pleural effusion with patchy lower lobe airspace opacities      suspicious for pneumonia, possibly on the basis of aspiration.  4. No suspicious __________ parenchymal organ findings.  5. Diffuse lumbar spondylosis pelvis with possible mural thickening of      the terminal ileum without evidence of associated bowel      obstruction.  Moderate free pelvic fluid of undetermined etiology.   HISTORY OF PRESENT ILLNESS:  The patient is an 77 year old gentleman,  recently hospitalized April 2 to April 9 with double pneumonia who was  doing well, feeling healthy until 48 hours prior to admission.  He had  been self treating for constipation using MiraLAX resulting in explosive  diarrhea.  The patient then developed abdominal  cramping diffusely  described as a 3/10 pain.  The night prior to admission he did need a  small meal had the onset of nausea, vomiting soon thereafter which  persisted throughout the night.  Because of his symptoms he presented to  the emergency department for evaluation and was subsequently found to  have what appeared to be a partial small-bowel obstruction and was  admitted for care.  Please see the EMR generated H and P for past  medical history, family history, social history.   HOSPITAL COURSE:  1. GI:  The patient was admitted to a regular floor.  He had an NG      tube placed in the emergency department to low intermittent      __________ suction.  The patient became pain free.  He did have      follow-up films which showed resolution spontaneously of his what      appeared to be small-bowel obstruction.  The patient was able to be      advanced to a diet which he took without difficulty.  Again no  nausea, vomiting.  No diarrhea is reported.  With the patient's      obstructive bowel pattern and nausea and vomiting being resolved he      is stable and ready for discharge.  2. Atrial fibrillation, the patient remained rate controlled.  3. Congestive heart failure. the patient had no evidence of      decompensation.  4. COPD/pneumonia, the patient has some residual changes on CT scan      but he has no clinical evidence of active infection with no fever,      no cough, no sputum, no shortness of breath with adequate O2      saturations for a gentleman his age with COPD.   DISCHARGE EXAMINATION:  Temperature was 98, blood pressure 149/88, heart  rate 98, respirations 18, O2 sats 91% on room air.  GENERAL APPEARANCE:  This is a thin, almost emaciated Caucasian male in  no acute distress.  HEENT:  Unremarkable.  CHEST:  Patient is moving air well.  I appreciated no wheezing, no rales  and increased work of breathing.  CARDIOVASCULAR:  The patient had a quiet precordium, he  had an irregular  irregular rhythm that was rate-controlled.  ABDOMEN:  Soft, he had no guarding or rebound, he had positive bowel  sounds.  There is no tenderness to deep palpation.   FINAL LABORATORY:  Urine culture from admission was negative.  RPR was  normal.  Lactic acid from the admission was normal at 1.5.  Urinalysis  revealed specific gravity of 1.016, cloudy urine but otherwise negative.  BNP was 304 at admission.  Cardiac markers:  Point care with a CK of  1.9, troponin-I of less than 0.05; followup CK was 38 with a troponin of  0.02.  Lipase at admission was 16 and normal.  Comprehensive metabolic  panel was remarkable for glucose of 157 but otherwise normal.  Creatinine was 0.55.  With the patient being stable he is ready for  discharge to home.  He will continue on all of his home medications  without change including:  1. Aspirin 81 mg daily.  2. __________ 0.4 mg daily.  3. Furosemide 40 mg daily.  4. Levothyroxine 50 mcg daily.  5. Carvedilol 6.25 mg b.i.d.  6. Lorazepam 0.5 mg b.i.d.  7. Lisinopril 10 mg daily.  8. Plavix 75 mg daily.  9. Sertraline 25 mg daily.  10.Nitroglycerin 0.4 mg sublingual p.r.n.  11.Omeprazole 20 mg q.a.m.  12.Potassium 20 mEq daily.  13.Albuterol 2.5 mg nebulizer treatments q.6 hours p.r.n.  14.Symbicort 80/4.5 two puffs through metered-dose inhaler b.i.d.  15.MiraLAX 17 grams daily.   DISPOSITION:  Home.   The patient will see Dr. Amador Cunas in follow-up in 10 - 14 days.   Patient's condition at time of discharge dictation is stable and  improved.      Rosalyn Gess Norins, MD  Electronically Signed     MEN/MEDQ  D:  08/09/2008  T:  08/09/2008  Job:  [id number]   cc:   Gordy Savers, MD  [address] [place]  [place] [address]

## 2010-08-19 NOTE — Discharge Summary (Signed)
[name], [name]         ACCOUNT NO.:  [id number]   MEDICAL RECORD NO.:  [id number]          PATIENT TYPE:  INP   LOCATION:  3003                         FACILITY:  [facility]   PHYSICIAN:  Titus Dubin. Hopper, MD,FACP,FCCPDATE OF BIRTH:  02-May[birth year]19[birth year]   DATE OF ADMISSION:  08/20/2008  DATE OF DISCHARGE:  08/23/2008                               DISCHARGE SUMMARY   ADMITTING DIAGNOSES:  1. Cerebrovascular accident, subacute.  2. Chronic chronic obstructive pulmonary disease, stable.  3. History of systolic heart failure with 35% ejection fraction  4. Hypothyroidism.   DISCHARGE DIAGNOSES:  1. Cerebrovascular accident, subacute.  2. Chronic chronic obstructive pulmonary disease, stable.  3. History of systolic heart failure with 35% ejection fraction  4. Hypothyroidism.   For details of the history and physical, please see the dictation by Dr.  Virginia Rochester on Aug 20, 2008.   In brief, this 77 year old white male with a history of paroxysmal  atrial fibrillation had been discharged 11 days prior after treatment  for intestinal obstruction.  For 3-4 days prior to admission, he noted  difficulty speaking.  In the emergency room, a CT scan revealed a new  left internal capsule hypodensity, question acute versus subacute  infarct.  MRI revealed an acute bland infarct affecting the left  internal capsule and posterior putamen.   Dr. Sharene Skeans of Neurology was consulted.  Because of the 4-day  symptomatology, this was thought to be subacute and no acute  thrombolytic intervention was warranted.   He remained asymptomatic throughout the hospital.  He did have some  fluctuation in blood pressure.  Blood pressure was noted to be as low as  98/65, prompting discontinuation of his Flomax.   He was evaluated by Speech Therapy, Physical Therapy, and Occupational  Therapy.  There were no identified acute needs.  Home Health Services  were to follow up with the patient.  He states he  does have a nurse in  the home where he lives with his wife.  He does manage all bills and  finances.  Speech Therapy felt that dysnomia was not impacting these  complex functional skills.  PT also felt like he was baseline, using a  straight cane with benefit.HHPT  was recommended to assess safety and  balance issues.   His hospitalization was also complicated by a 5-beat of V-tach  unassociated with the hypotension.  This did not recur.   No internal carotid stenosis was noted on carotid Dopplers.   The 2-D echocardiogram suggested an atrial web which had not been noted  on a 2-D echo on July 09, 2008.  His ejection fraction had previously  been 35%; on this new echo ejection fraction was felt to be 50-55%.   Because of the infarct while supposedly on Plavix and baby aspirin,  Cardiology was consulted.  He had previously been followed by Dr.  Randa Evens, cardiologist, and Coumadin had been prescribed.  The  patient felt that the Coumadin had caused a 40-pound weight loss and  adamantly declined to take it.   No further workup was recommended for the 5-beats of V-tach.  Coumadin  was recommended as  not contraindicated. The atrial web required no  treatment.   On the day of discharge, I discussed these issues with the patient.  He  admitted that he had not taken his Plavix and Coumadin for at least 3-4  days prior to the event.  I did explain that the Coumadin would not have  caused 40-pound weight loss and that this would be the only option or he  is to have recurrent infarcts while on Plavix and aspirin.  The aspirin  that he had been taking was 81 mg and certainly this could be increased.   Labs revealed a normal homocysteine level and A1c of 5.9.  His LDL was  143 and HDL 37.  Pravastatin 20 mg was added.  His cardiac panels  revealed no acute process.  His CBC revealed minimal anemia with a  hematocrit of 37.8 with platelet count of 319,000.   On the day of discharge,  the patient was oriented x3.  He was afebrile.  Pulse was variable from 76-109.  Respiratory rate was 22.  Blood  pressure was 150/82, it had ranged from 84/53 to the high of 150/82.  Additionally on day of discharge, he had an O2 sat of 96% on room air.  He had a slow irregular rhythm.  There was no increased work of  breathing.  He appeared somewhat cachectic and barrel-chested.  Abdomen  was nontender with good bowel sounds.  He had no peripheral edema.   He was discharged on his admission medications with the exception of  holding the Flomax and the addition of pravastatin 20 mg at bedtime.   He was to resume his coated baby aspirin 81 mg daily with the Plavix 75  mg daily.   He was asked to follow up with Dr. Amador Cunas in 7-10 days. Followup  can be with Dr. Myrtis Ser as needed and with Dr. Sharene Skeans as needed.   Diet will be as tolerated.  He will receive Home Health Services with  skilled nursing, PT, OT, HHA.      Titus Dubin. Alwyn Ren, MD,FACP,FCCP  Electronically Signed     WFH/MEDQ  D:  08/23/2008  T:  08/23/2008  Job:  [id number]   cc:   Luis Abed, MD, [facility]  Gordy Savers, MD  Deanna Artis. Sharene Skeans, M.D.

## 2010-08-19 NOTE — Consult Note (Signed)
NAMEPASCO, MARCHITTO NO.:  [id number]   MEDICAL RECORD NO.:  [id number]          PATIENT TYPE:  INP   LOCATION:  3003                         FACILITY:  [facility]   PHYSICIAN:  Luis Abed, MD, FACCDATE OF BIRTH:  11-20-1917   DATE OF CONSULTATION:  08/22/2008  DATE OF DISCHARGE:                                 CONSULTATION   [name] is admitted to [facility] with a CVA.  He was seen  today by Dr. Alwyn Ren.  The patient has a history of an ejection fraction  in the range of 35%.  He has chronic atrial fibrillation.  He has not  been on Coumadin.  It is my understanding that he could not take  Coumadin but I do not have the specific specifics concerning this.  On  his monitor, he has atrial fib.  He had five beats of ventricular  tachycardia.  He also had a 2-D echo done on Aug 21, 2008.  This study  showed that in fact his ejection fraction seemed to be better, in the  50% range.  However, there was question of a web in the right atrium.  I am consulted to comment further on his rhythm and the question of the  web in his right atrium.   He is stable.  He is not having any significant cardiac symptoms.   PAST MEDICAL HISTORY:  1. Allergies:  Question of Novocaine.  2. Medications:  See the documented H and P with this admission      listing his medications.  3. Other medical problems:  See the complete list below.   SOCIAL HISTORY:  The patient his married.  He spends a good deal of time  at [facility].   FAMILY HISTORY:  There is no strong family history of coronary disease.   REVIEW OF SYSTEMS:  Today he appears to be quite stable.  He does not  have any fevers or chills.  There are no skin rashes.  He is not having  any headaches.  He has no problems with his eyes or his hearing today.  There is no cough.  There is no chest pain.  There is no shortness of  breath.  He does not have any skin rashes.  He has no edema.  All other  systems are  reviewed and are negative.   PHYSICAL EXAMINATION:  Blood pressure is 120/83 with a pulse of 70.  The patient is oriented to person, time, and place.  Affect is normal.  In general, he appears cachectic but his overall responsiveness is quite  good.  There is no xanthelasma.  There is normal extraocular motion.  There are  no carotid bruits.  There is no jugular venous distention.  LUNGS:  Clear.  Respiratory effort is not labored.  CARDIAC:  Exam reveals S1-S2.  There are no clicks or significant  murmurs.  ABDOMEN:  Thin.  He has no significant peripheral edema.   His monitor strips reveal atrial fibrillation with a controlled rate  with five beats of ventricular tachycardia.  His 12-lead EKG reveals no  diagnostic change.  2-D echo  was mentioned in the HPI.  There is an  ejection fraction of 50%.  There is question of a web seen in the right  atrium.  This is a difficult call.  Hemoglobin was 12.7.  BUN was 20  with a creatinine of 0.6.  Troponins revealed no marked abnormality.   PROBLEMS:  1. Chronic atrial fibrillation.  Historically he has not been      anticoagulated because he could not take Coumadin.  I do not know      the specifics.  This needs to be reconsidered.  2. Chronic obstructive pulmonary disease.  3. Hypertension.  4. History in the past of congestive heart failure.  5. History of coronary disease.  6. History of ejection fraction of 35%, but by current echo, his      ejection fraction is 50%.  7. Hypothyroidism, treated.  8. *Five beats of ventricular tachycardia seen on the current monitor.      I would not work this up any further at this time.  It would be      helpful to be sure that his potassium is normal.  9. *Possible web in the right atrium.  This is a difficult call.  I do      not think it needs further workup.  In this situation, there is no      finding at this point that would change the approach to the care.      Overall, the patient has had  a cerebrovascular accident with a      history of atrial fibrillation.  He needs to be on Coumadin.  I      understand from prior notes that this may not be possible.  My      recommendation would be to use Coumadin if it is not      contraindicated and if it is felt that this was an embolic      cerebrovascular accident.   We will follow the patient as needed.  I will be happy to follow his  cardiac status after the hospitalization.      Luis Abed, MD, [facility]  Electronically Signed     JDK/MEDQ  D:  08/22/2008  T:  08/22/2008  Job:  [id number]

## 2010-08-20 LAB — CULTURE, BLOOD (ROUTINE X 2)
Culture  Setup Time: 201205102049
Culture  Setup Time: 201205102049
Culture: NO GROWTH
Culture: NO GROWTH

## 2010-08-22 NOTE — H&P (Signed)
[name], [name]         ACCOUNT NO.:  [id number]   MEDICAL RECORD NO.:  [id number]          PATIENT TYPE:  INP   LOCATION:  4704                         FACILITY:  [facility]   PHYSICIAN:  Eugenio Hoes. Tawanna Cooler, M.D. [facility] OF BIRTH:  1918/02/09   DATE OF ADMISSION:  03/20/2005  DATE OF DISCHARGE:  03/21/2005                                HISTORY & PHYSICAL   PRIMARY CARE PHYSICIAN:  Gordy Savers, M.D.   This is one of many [name] admissions for this 77-year-  old married white male who comes to the office and was subsequently admitted  for evaluation of shortness of breath.   Patient states he felt well until yesterday when he became increasingly  short of breath.  He had no fever, chills, cough, increased weight, etc.,  etc., etc.  He said he had spells like this every now and then but this time  the spell will not go away.  He was able to sleep last night.  He came to  the office and was found to have an O2 saturation of 90%.  He was  immediately started on one liter of O2.  O2 saturation jumped to 98 and he  felt remarkably better.  He was subsequently admitted to the hospital for  further evaluation.   PAST MEDICAL HISTORY:  This will be extensively outlined in his cardiac  evaluations by Dr. Samule Ohm.  He has had history of cardiomyopathy,  hypothyroidism, hypertension, peripheral vascular disease, COPD, stent in  his right femoral, degenerative joint disease, intermittent atrial  fibrillation which he is currently in, AAA repair, cervical disc, three  lumbar laminectomies, appendectomy, a T&A, bilateral hernia repair and a  TURP in 1998, spontaneous pneumothorax in 1947, coronary disease with stents  in 1998 too.   PAST ILLNESSES:  Above, negative.   INJURIES:  Negative.   ALLERGIES:  NO KNOWN DRUG ALLERGIES.   CURRENT MEDICATIONS:  1.  Synthroid 0.05 daily.  2.  Protonix 40 daily.  3.  Aspirin 325 mg daily.  4.  Plavix 75 mg daily.  5.  Flomax 0.4 daily.  6.  He was on Lasix but Dr. Kirtland Bouchard discontinued that in early December because he      had lost so much weight.  7.  Coreg 6.25 mg half tablet b.i.d.  8.  His p.r.n.'s are hydrocodone, nitroglycerin, lorazepam 0.5.   He does not smoke anymore.  He quit in 1961.  He does not drink any alcohol  except for an occasional drink.   FAMILY HISTORY:  As recorded, unchanged.   VACCINATION HISTORY:  I am unsure of his vaccination status.  It is not  recorded on the face sheet.   PHYSICAL EXAMINATION:  GENERAL APPEARANCE:  He is a thin male, pale, short  of breath, in distress with remarkable improvement of one liter of O2.  HEENT:  Negative.  NECK:  Supple.  Thyroid not enlarged.  CHEST:  Clear to auscultation except he had markedly decreased breath sounds  bilaterally and very small chest.  He also has some scoliosis.  CARDIOVASCULAR:  No rubs or gallops, nor  murmurs.  ABDOMEN:  Scar from AAA, otherwise negative.  EXTREMITIES:  No edema.   Patient has shortness of breath and hypoxia with a pO2 pulse oximeter of 90  on room air with underlying atrial fibrillation, cardiomyopathy, coronary  disease, chronic obstructive pulmonary disease.  I suspect that he is  probably just at end-point chronic obstructive pulmonary disease and he is  going to need to be on continuous oxygen at home.  However, will admit and  monitor to make sure there is no other correctable etiology.           ______________________________  Eugenio Hoes Tawanna Cooler, M.D. [facility]     JAT/MEDQ  D:  03/20/2005  T:  03/23/2005  Job:  [id number]   cc:   Gordy Savers, M.D. [facility]  [address] [place]  [place] [address]   Corwin Levins, M.D. [facility]  [address]  [place]  [place] [address]   Melissa S. Peggyann Juba, NP

## 2010-08-22 NOTE — Cardiovascular Report (Signed)
NAME:  [name], BATTA NO.:  [id number]   MEDICAL RECORD NO.:  [id number]                   PATIENT TYPE:  INP   LOCATION:  6529                                 FACILITY:  [facility]   PHYSICIAN:  Salvadore Farber, M.D. [facility]         DATE OF BIRTH:  Nov 08, 1917   DATE OF PROCEDURE:  08/30/2003  DATE OF DISCHARGE:                              CARDIAC CATHETERIZATION   PROCEDURE:  Left heart catheterization, left ventriculography, coronary  angiography, intravascular ultrasound of the LAD, drug-eluting stent  placement to the ostium of the RCA, Angio-Seal closure of the left common  femoral artery.   INDICATION:  Mr. Favor is an 77 year old gentleman with history of  coronary and peripheral vascular disease.  He is status post stenting of the  distal circumflex in 1998.  He now presents with several weeks of shortness  of breath.  While in hospital he has had a 19-beat run of nonsustained VT.  He ruled out for myocardial infarction by serial enzymes.  Based on his  known coronary disease, shortness of breath, and VT he was referred for  diagnostic angiography with an eye to percutaneous revascularization.   PROCEDURAL TECHNIQUE:  Informed consent was obtained.  Under 1% lidocaine  local anesthesia, a 6 French sheath was placed in the left common femoral  artery using the modified Seldinger technique.  Diagnostic angiography and  ventriculography was performed using JL-4, JR-4 and pigtail catheters.  These images demonstrated  a hazy 50% stenosis of the mid LAD that was not  clearly severe.  However, the electrocardiogram had precordial deep T wave  inversion suggestive of involvement in the anterior wall and the apex was  akinetic on ventriculogram.  Thus, I was concerned that this hazy lesion may  in fact be the culprit lesion.  In addition, he had a thrombotic 95%  stenosis of the ostium of the RCA.  Decision was made to perform  intravascular  ultrasound of the LAD to guide further therapy and proceed to  stenting of the RCA.   Anticoagulation was initiated with 600 mg of Plavix, heparin and double-  bolus eptifibatide to achieve and maintain an ACT of greater than 200  seconds.  I began with the LAD.  A 6-French CLS 3.5 guide was advanced over  wire and engaged in the ostium of the left main.  A Luge wire was advanced  to the distal LAD.  The intravascular ultrasound was performed by automated  pullback.  This demonstrated approximately 50% diameter stenosis. The  haziness was explained by dense 1-2 quadrant superficial calcification.  There was no evidence of thrombus.  Based on these findings, no intervention  was performed.   Attention was then turned to the ostial RCA lesion.  A 6 [place] JR-4 guide  with side holes was advanced over wire and engaged in the ostium of the RCA.  The lesion was crossed without difficulty with Luge wire.  The lesion was  then predilated using  a 3.0 x 15-mm Quantum at 12 atmospheres.  I then  attempted to pass a 3.5 x 23-mm Cypher across the lesion.  However, this  could not be made to pass.  It appeared to be hanging up at a cleft  approximately 10 mm into the vessel.  A 2.0 x 9-mm Maverick was then passed  distally.  Wire was exchanged via the Maverick for a support wire.  The  Maverick was use to dilate the segment with the cleft at 10 atmospheres.  Another attempt was made with the Cypher again without success.  I then  attempted to pass a 3.5 x 20-mm Quantum. This to could not be passed.  I  then advanced a wiggle wire and with the wiggle wire I was able to pass a  3.5 x 20-mm Quantum. It was then used to dilate the segment with cleft at 12  atmospheres.  After this, I was able to pass the 3.5 x 23-mm Cypher over the  wiggle wire.  It was carefully positioned to protrude approximately 1 mm  into the aorta covering the ostium.  The support wire was removed and the  Cypher deployed at 18  atmospheres.  The proximal portion of the stent was  then post dilated using a 3.5 x 20-mm Quantum at 20 atmospheres.  Final  angiogram demonstrated TIMI-3 flow to the distal vasculature with less than  10% residual stenosis.   Angiography via the sheath confirmed sheath to enter the common femoral  artery.  The arteriotomy was then closed using a 6 [place] Angio-Seal device.  Complete hemostasis was obtained.  The patient was transferred to the  holding room in stable condition.   COMPLICATIONS:  None.   FINDINGS:  1. LV:  146/15/29.  EF 50% with apical akinesis.  2. Left main:  20% stenosis of the mid vessel.  3. LAD:  Large vessel giving rise to a single large branching diagonal.     There is 50% hazy stenosis of the mid vessel.  This was demonstrated by     IVUS to be only a moderate stenosis with dense superficial calcium.  4. Circumflex:  Luminal irregularities proximally.  The stent in the distal     vessel was widely patent with approximately 30% in-stent restenosis.  5. RCA:  Large, dominant vessel.  There was a 95% ostial stenosis treated     with drug-eluting stent to less than 10% residual.  There is a 50%     stenosis of the distal vessel proximal to the take off of the PDA.   IMPRESSION/RECOMMENDATIONS:  1. Hazy stenosis of the mid left anterior descending demonstrated to be only     moderate and without evidence of plaque rupture by intravascular     ultrasound.  Continued medical therapy for this.  2. Successful drug-eluting stent placement in the ostial lesion of the right     coronary artery.  Recommend Plavix for a minimum of six months.  Aspirin     to be continued indefinitely at 81 mg daily.  Will reinitiate Coumadin     for his paroxysmal atrial fibrillation and apical akinesis.  Goal INR 2.0     to 3.0.  Eptifibatide will be continued for 18 hours, then discontinued.  Salvadore Farber, M.D. [facility]   WED/MEDQ  D:   08/30/2003  T:  08/31/2003  Job:  [id number]   cc:   Vida Roller, M.D.  Fax: [phone]

## 2010-08-22 NOTE — Discharge Summary (Signed)
NAME:  [name], [name] NO.:  [id number]   MEDICAL RECORD NO.:  [id number]                   PATIENT TYPE:  INP   LOCATION:  3729                                 FACILITY:  [facility]   PHYSICIAN:  Wanda Plump, MD [facility]                 DATE OF BIRTH:  25-Apr-1917   DATE OF ADMISSION:  11/08/2003  DATE OF DISCHARGE:  11/10/2003                                 DISCHARGE SUMMARY   ADMISSION DIAGNOSES:  1. Recent fall with laceration of the forehead and questionable subdural     hematoma.  2. Atrial fibrillation.  3. History of ischemic cardiomyopathy with ejection fraction of 40 to 55%.  4. History of nonsustained ventricular tachycardia.  5. Hypertension.  6. Hypothyroidism.  7. Chronic obstructive pulmonary disease.  8. History of duodenal ulcer.  9. Restless leg syndrome.  10.      Coronary artery disease status post stent placement May 2005.  11.      Chronic Coumadin.   HISTORY OF PRESENT ILLNESS:  The patient was admitted to the hospital after  he sustained a fall.  Please see the History and Physical for details.   LABORATORY AND X-RAY DATA:  Initial PT/INR 3.5, at time of discharge 3.1.  Cardiac enzymes x 3 were negative.  Potassium 4.2, glucose 110, BUN 17,  creatinine 0.8.   Chest x-ray showed no evidence of disease.   Right hand x-ray showed no fracture.   Initial CT of the head showed atrophy, cannot exclude a small extra-axial  hemorrhagic fluid bullation over the left frontal lobe.   CT of the spine showed no evidence of acute cervical spine injury.   Repeat CT of the head showed questionable tiny left subdural hematoma  without any mass effect or midline shift.   HOSPITAL COURSE:  The patient was admitted to the hospital and continued  with his routine medicines.  EKG showed that he was in atrial fibrillation.  His hospital stay was basically unremarkable.  At the time of discharge, his  vital signs were stable.  He was alert and  oriented, in no apparent  distress.  His neurological exam was basically nonfocal.  He stated today  that he is ready to go home.  I also mentioned the findings of the CT scan,  and he said he would not let anybody operate on him.  Because of the  findings of the CT scan, I talked with the neurosurgeon on call, and we  decided to let the patient go home on Coumadin.  The factors contributing to  our decision include the fact that the finding was very questionable and  small, also the fact that he needs Coumadin and that he is doing well  clinically.  At this point, he will be discharged home with the following  instructions.   DISCHARGE INSTRUCTIONS:  1. Continue with the regular doses of Synthroid, Protonix, Cozaar, Mirapex,     Flomax.  2. Hold aspirin and Plavix until he sees his primary care physician,  Dr.     Amador Cunas early next week.  3. Will reduce the dose of Coumadin to 5 mg every day except Monday and     Thursday take only 2.5 mg.  4. He is to return to the emergency room immediately if he has headache,     nausea, vomiting, or weakness.  5. The patient understands there is a risk with taking the Coumadin, but     there is also a risk in stopping taking it since he is in atrial     fibrillation.  This was discussed with the family, and they seem to     understand.                                                Wanda Plump, MD [facility]    JEP/MEDQ  D:  11/10/2003  T:  11/11/2003  Job:  [id number]

## 2010-08-22 NOTE — Consult Note (Signed)
[name], [name] NO.:  [id number]   MEDICAL RECORD NO.:  [id number]          PATIENT TYPE:  INP   LOCATION:  1830                         FACILITY:  [facility]   PHYSICIAN:  Arvilla Meres, M.D. LHCDATE OF BIRTH:  Jun 30, 1917   DATE OF CONSULTATION:  03/20/2005  DATE OF DISCHARGE:                                   CONSULTATION   CONSULTANT:  Arvilla Meres, M.D.   PRIMARY CARE PHYSICIAN:  Gordy Savers, M.D.   CARDIOLOGIST:  Salvadore Farber, M.D.   REQUESTING PHYSICIAN:  Rene Paci, M.D.   REASON FOR CONSULTATION:  Acute on chronic dyspnea.   HISTORY OF THE PRESENT ILLNESS:  Mr. Skalski is a delightful 77-year-  old male with  a history of coronary artery disease status post several  previous stents, mild ischemic cardiomyopathy with an EF of about 40% and  COPD.  He reports a long history of chronic severe dyspnea.  He states that  over the past week or two this has progressed to the point where he cannot  walk more than about 50 feet before having to stop.  He went to his primary  care physician today who sent him to the ER for further evaluation.   In talking with him he does have occasional chest tightness, but he states  this is very different from his previous angina.  Her also has chronic two-  pillow orthopnea, but denies any PND or lower extremity edema.  He was  previously on Lasix, but this was stopped as he was losing too much weight.  He does have a mildly nonproductive cough, but denies any fevers or chills.  He denies any night sweats.   REVIEW OF SYSTEMS:  The patient does complain of some anorexia as well as  some headaches.  He has occasional nausea and gastroesophageal reflux  disease as well as arthralgias related to his osteoarthritis.   PROBLEM LIST:  1.  Coronary artery disease:      1.  status post three stents; most recently a stent to the ostial right          coronary artery in May 2005;      2.   cardiac catheterization at that time showed an ejection fraction of          50% with apical akinesis; the left main had 20% stenosis, left          anterior descending had 50% midvessel stenosis, circumflex had 30%          in-stent restenosis, right coronary artery had 95% ostial stenosis,          which was treated with a drug-eluting stent, and there was a 50%          stenosis distally;      3.  adenosine Cardiolite in June 2005 showed an EF of 39% with no          evidence of inducible ischemia.   1.  Ischemic cardiomyopathy; ejection fraction of approximately 40%.  2.  Chronic atrial fibrillation; refusing Coumadin.  3.  Abdominal aortic aneurysm status post repair.  4.  History of nonsustained ventricular tachycardia.  5.  Chronic obstructive pulmonary disease.  6.  Hypertension.  7.  Hypothyroidism.  8.  Peptic ulcer disease.  9.  Status post appendectomy.  10. Osteoarthritis.  11. Cervical disk disease status post several surgeries.   MEDICATIONS:  Medications on admission included:  1.  Synthroid 50 mcg a day.  2.  Protonix 40 mg a day.  3.  Aspirin 325 mg a day.  4.  Flomax 0.4 mg a day  5.  Coreg 3.125 mg a day.  6.  Ativan.   ALLERGIES:  Medication allergies are NOVOCAIN.   SOCIAL HISTORY:  The patient lives in [place] with his wife.  He is a  former Technical brewer for [employer]; he is now retired.  Tobacco;  one pack/day for about 20-25 years and quit 35 years ago.  Alcohol;  occasional beer.   FAMILY HISTORY:  Mother died of an MI at age 62.  Father died of a  pneumothorax related to surgery.  He has two sisters; one has dementia and  the other has cancer.   PHYSICAL EXAMINATION:  GENERAL APPEARANCE:  On physical exam he is an  elderly male lying flat in bed in no acute distress.  He appears weak and  fatigued.  VITAL SIGNS:  Blood pressure is 152/82.  His heart rate is 90 and irregular.  His temperature is 96.8.  he is satting 98% on room air.   HEENT:  Sclerae anicteric.  EOMI.  There is no xanthelasma.  Mucous  membranes are moist.  NECK:  The neck is supple.  There is no JVD.  Carotids are 2+ bilaterally  without any bruits.  There is no lymphadenopathy or thyromegaly.  HEART:  Cardiac - he has an irregularly irregular rhythm with no obvious  murmurs, rubs or gallops.  LUNGS:  The lungs are clear to auscultation; however, there is decreased air  movement throughout.  There are no crackles or wheezes.  ABDOMEN:  The abdomen is soft, nontender and nondistended.  There is no  hepatosplenomegaly.  No bruits.  No masses.  There is a well-healed surgical  scar from his abdominal aortic aneurysm,  there are good bowel sounds.  EXTREMITIES:  The extremities are warm with no cyanosis, clubbing or edema.  Dorsalis pedis pulses are 1+ bilaterally.  NEUROLOGIC EXAMINATION:  The patient is alert and oriented times three.  Cranial nerves II-XII are intact. He can use all four extremities without  difficulty.  His affect is appropriate.   ANCILLARY DATA:  Chest x-ray showed COPD changes with hyperinflation, but no  acute cardiopulmonary process.  EKG showed atrial fibrillation with a  ventricular response of 87,  there are no significant ST-T wave changes.   LABORATORY DATA:  Labs showed sodium of 135, potassium 4.2, BUN of 15,  creatinine 0.9, and glucose 93.  White count 4.6, hemoglobin 14.3 and  platelets 160,000.  CK total 107, MB .4 and troponin 0.02.  BNP 124. Albumin  3.2.   ASSESSMENT AND PLAN:  Acute on chronic dyspnea and failure to thrive.   I doubt this is cardiac-related and likely more related to his intrinsic  lung disease and perhaps other comorbidities.  Cardiac markers at this point  are not significantly elevated and there is no other evidence of significant  heart failure or cardiac ischemia.  He did have a echocardiogram today, which showed an ejection fraction of 35-45% with mild mitral regurgitation  and tricuspid  regurgitation, and  mild diastolic dysfunction.   Plan:  At this point I would recommend:  1.  Rule out myocardial infarction on telemetry.  2.  If this is negative we will proceed with adenosine Cardiolite tomorrow.  3.  We will treat his hypertension given his cardiomyopathy he might benefit      from and ACE inhibitor.  4.  Would check pulmonary function testing with DLCO.  5.  Agree with checking TSH.  6.  If this workup is unrevealing I might consider pulmonary stress test if      he can walk on the treadmill.   We appreciate the consult and we will follow.      Arvilla Meres, M.D. [facility]  Electronically Signed     DB/MEDQ  D:  03/20/2005  T:  03/24/2005  Job:  [id number]   cc:   Gordy Savers, M.D. [facility]  [address] [place]  [place] [address]   Salvadore Farber, M.D. [facility]  [address]  [address]  [place]  [place] [address]   Rene Paci, M.D. [facility]  [address] [place], [place] [address]

## 2010-08-22 NOTE — Discharge Summary (Signed)
NAME:  [name], [name] NO.:  [id number]   MEDICAL RECORD NO.:  [id number]                   PATIENT TYPE:  INP   LOCATION:  6529                                 FACILITY:  [facility]   PHYSICIAN:  Vida Roller, M.D.                DATE OF BIRTH:  Apr 24, 1917   DATE OF ADMISSION:  08/29/2003  DATE OF DISCHARGE:  09/01/2003                                 DISCHARGE SUMMARY   ADMISSION DIAGNOSES:  1. Recurrent atrial fibrillation (paroxysmal).     a. Symptoms of fatigue and dyspnea.  2. Two dimensional echocardiogram  performed Aug 28, 2003 showed an ejection     fraction of 40-45% with mild mitral regurgiation, mild aortic     insufficiency, and apical akinesis.  3. Nonsustained ventricular tachycardia (NSVT).  4. Known coronary artery disease-1998 [facility], catheterized with     possible stenting.  5. Hypertension.  6. Hypothyroidism.  7. Peripheral vascular disease status post right common femoral artery     stenting.  8. Chronic pulmonary obstructive disease.  9. Degenerative joint disease.  10.      History of abdominal aortic aneurysm repair 2001.  11.      Remote duodenal ulcer.  12.      Cervical disk disease.   DISCHARGE DIAGNOSES:  Same with the exception of:  1. Paroxysmal atrial fibrillation-patient continues to remain in atrial     fibrillation.     a. May require outpatient cardioversion in four weeks.  2. Nonsustained ventricular tachycardia-felt to be in the setting of a non Q     wave and mild to normal ejection fraction.  Recommend continued beta     blocker therapy with no ICD warranted.  3. Coronary artery disease-cardiac catheterization performed during     hospitalization with stenting of an RCA lesion.  Remainder of the     diagnoses is the same.   HISTORY OF PRESENT ILLNESS:  Mr. Flori is an 77 year old gentleman  who has a history of coronary artery disease with possible remote stenting  in 1998 [place] in [place], [place] by Dr. Task, phone number [phone].  Recently, the patient had been having shortness of breath waking  him from sleep.  He saw Dr. Lauretta Chester on Aug 27, 2003.  An EKG showed  atrial fibrillation with controlled ventricular response.  He was referred  to [facility] for 2-D echo Aug 28, 2003 which showed an EF reduced  to 40-45% with mild MR and mild AI with apical akinesis.  He was admitted to  [facility] Aug 29, 2003 for further evaluation of suspected congestive heart  failure and probable cardiac catheterization because of abnormal echo  findings.  Of note, upon admission to the hospital, he was back in sinus  rhythm with PACs but did have a 19 beat run of NSVT.   It is felt that the patient has evidence of congestive heart failure and  will undergo cardiac catheterization on Aug 30, 2003.  He will need an EP  consult for evaluation of a biventricular ASCD.  Coumadin will be held and  recommend up titration of Coreg with down titration of calcium channel  blocker.   PROCEDURES:  Cardiac catheterization Aug 30, 2003 by Dr. Samule Ohm revealing  high-grade obstruction of the RCA of 95% with heavy thrombus.  Drug-eluding  stent placed successfully with residual less than 10% Luminal stenosis.   CONSULTATIONS:  Internal medicine for medical management.  2. [facility] EP for  NSVT.   COMPLICATIONS:  None.   COURSE IN THE HOSPITAL:  Mr. Revoir was admitted to [facility] on Aug 29, 2003 as mentioned above.  Coumadin was held.  INR was  1.1 and he was started on heparin therapy.  Pharmacy managed this.  Admitted  lab showed a WBC of 4.6, hemoglobin 11.7, and platelets 181.  INR 1.1.  Sodium 136, potassium 4.2, BUN 11, creatinine 0.8, glucose 102.  LFTs within  normal limits.  TSH 0.608.  Total cholesterol 152, triglycerides 48, HDL 48,  and LDL 94.  Cardiac enzymes series as follows:  PK 129, 108, 80.  MV 6.0,  5.5, 6.4.  ___________ 0.04, 0.03, 0.03.   Hemoglobin A1C is 5.7%.  ENP  949.6.   With respect to CHF, the patient remained clinically stable and did not  require any aggressive diuretic therapy.   Cardiac catheterization was performed on Aug 30, 2003 and revealed a 20%  proximal LAD, 50% hazy mid-LAD confirmed by ___________, and a patent stent  in the distal circumflex with about 30% in-stent restenosis.  ERCA had a 95%  proximal/osteal lesion and a distal 50% lesion.   Dr. Samule Ohm proceeded with Cypher stenting with a 3.50 x 23 mm stent to the  proximal RCA.  He successfully reduced this to a less than 10% residual.  The patient tolerated the procedure well.  Recommend Plavix for six months  and aspirin indefinitely.  Coumadin for PAF and apical akinesis.   POST CATHETERIZATION:  The patient remained stable.  He did have diffuse  deep T wave inversion on EKG.  No significant enzyme bump.   [facility] was consulted on Aug 31, 2003 and they felt that given the NSVT  occurring in the setting of probable coronary ischemia and a normal EF of  50% by cath, that ICD was not warranted at this time but would recommend  continuation of beta blocker therapy.  With respect to atrial fibrillation,  if he continued to be symptomatic with CHF, they would consider TEE  cardioversion after four weeks of therapeutic anticoagulation.   Of note, there is an office note from Dr. Delfin Edis office from [facility]  on the chart, dated October 04, 2002 at which time the patient was evaluated  for PAF.  He elected to use Plavix and aspirin because he felt the Coumadin  made him feel poorly.  They discussed risks of embolic events, that the  patient felt that Coumadin was severely impairing his quality of life.   [facility] that the patient should be started on Coumadin therapy.  Also they are aware of the increased risk of bleed being on Coumadin,  aspirin, and Plavix and will defer to Dr. Lauretta Chester.  The patient was seen on Sep 01, 2003 by  Dr. Chales Abrahams who felt that he was  stable for discharge to home.  Still in atrial fibrillation with a rate of  68.  Lungs  relatively clear to auscultation.  INR 1.7.  Hemoglobin 10.4, and  platelet 158,000.   He recommended discharge to home on aspirin and Plavix for 2-3 months and  Coumadin for an indefinite period of time.  Goal INR 2-3.  Followup in  Coumadin clinic on Tuesday.  If the patient has problems with bleeding,  would recommend discontinuation of aspirin therapy.  Followup will with EP  in four weeks to consider cardioversion.  Recommends increasing carvedilol  to 12.5 mg b.i.d.   Decision about statin therapy will need to be made on an outpatient basis.  The patient has a list of profiles but consider low dose versus clot  stabilization.   DISCHARGE MEDICATIONS:  1. Enteric coated aspirin 325 mg daily.  2. Plavix 75 mg a day (prescription     written).  3. Synthroid 50 mcg daily.  4. Protonix 40 mg daily.  5.     Cozaar 50 mg q.h.s.  7. Mirapex 0.25 mg one tablet  in the morning, one     at noon, and two at bedtime.  8. Coreg 12.5 mg b.i.d.  9. Flomax 0.4 mg     daily.  10. Coumadin 2.5 mg daily.  11. Multivitamin daily.  12.     Nitroglycerin as needed for chest pain.   Avoid heavy lifting for the next two weeks.  Low sodium, low cholesterol  diet.  He may begin driving Sunday, May 29.  He may shower. He should call  the office if any problems or questions.   He should followup with Dr. Dorethea Clan in two weeks, followup with [facility] in  four weeks, and Coumadin clinic this Tuesday.  The office has been called to  reinstate his appointment.      Georgiann Cocker Jernejcic, P.A.                   Vida Roller, M.D.    TCJ/MEDQ  D:  09/01/2003  T:  09/01/2003  Job:  [id number]   cc:   Graciela Husbands DR   Ladona Ridgel DR

## 2010-08-22 NOTE — H&P (Signed)
NAME:  [name], [name] NO.:  [id number]   MEDICAL RECORD NO.:  [id number]                   PATIENT TYPE:  EMS   LOCATION:  MAJO                                 FACILITY:  [facility]   PHYSICIAN:  Rollene Rotunda, M.D.                DATE OF BIRTH:  May 24, 1917   DATE OF ADMISSION:  09/03/2003  DATE OF DISCHARGE:                                HISTORY & PHYSICAL   PRIMARY CARE PHYSICIAN:  Gordy Savers, M.D.   REASON FOR PRESENTATION:  Evaluate patient with shortness of breath.   HISTORY OF PRESENT ILLNESS:  The patient is a pleasant 77 year old gentleman  who was discharged a couple of days ago after admission for dyspnea.  He was  noted on that admission to have a profoundly abnormal EKG with deep  anterolateral T-wave inversions.  He had known prior coronary disease as  described above.  He also has a reduced ejection fraction of 40-45%, which  has previously been known.  He did undergo cardiac catheterization by Dr.  Samule Ohm and was found to have a hazy 50% LAD lesion.  However, this was  evaluated with an intravascular ultrasound and there was no evidence of a  thrombus.  He did have a 95% ostial right coronary lesion that was stented  with a Cypher stent.   At the time of discharge, the patient was in atrial fibrillation which he  has been in intermittently over the years.  His breathing was improved.  He  said he did well for a day.  However, yesterday his shortness of breath  started again.  He was unable to lie flat because of this.  He was not  having any symptoms when he would get up and move around, however.  He has  not had any chest discomfort (which has not been a consistent symptom).  He  has had no neck discomfort or arm discomfort.  He has had no activity-  induced nausea, vomiting, or excessive diaphoresis.  He has not noticed any  palpitations.  He had no presyncope or syncope.  He presented to the  emergency room for further  evaluation.   PAST MEDICAL HISTORY:  1. Cardiomyopathy (EF 40-45%).  2. Mild mitral regurgitation.  3. Mild aortic insufficiency.  4. Coronary artery disease as described.  5. Nonsustained ventricular tachycardia.  6. Persistent atrial fibrillation.  7. Hypertension.  8. Hypothyroidism.  9. Chronic obstructive pulmonary disease.  10.      Degenerative joint disease.  11.      Remote duodenal ulcer.  12.      Tremor/spasm of unclear etiology.   PAST SURGICAL HISTORY:  1. Abdominal aortic aneurysm repair.  2. TURP.  3. Tonsillectomy.  4. Cervical disk surgery x 3.   ALLERGIES:  1. DIGOXIN caused weight loss.  2. NOVOCAINE.  3. MACRODANTIN.   MEDICATIONS:  1. Aspirin 325 mg daily.  2. Plavix 75 mg daily.  3. Synthroid 50  mcg daily.  4. Protonix 40 mg daily.  5. Cozaar 50 mg daily.  6. Mirapex 0.25 mg q.a.m., at noon, and two q.h.s.  7. Coreg 12.5 mg b.i.d.  8. Flomax 0.4 mg q.h.s.  9. Coumadin 2.5 mg daily.  10.      Cardizem 120 mg daily.   SOCIAL HISTORY:  The patient is married.  [place].  He is retired after having worked with the [employer].  He quit smoking approximately 40 years ago.   FAMILY HISTORY:  Noncontributory for early coronary artery disease.   REVIEW OF SYSTEMS:  As stated in the HPI and otherwise negative for all  other systems.   PHYSICAL EXAMINATION:  GENERAL APPEARANCE:  The patient is in no acute  distress.  VITAL SIGNS:  Blood pressure 130/78, heart rate 80 and irregular, afebrile.  HEENT:  Eyelids unremarkable.  Pupils equal, round, and reactive to light.  Fundi not visualized.  Oral mucosa unremarkable.  NECK:  No jugular venous distention.  Wave form within normal limits.  Carotid upstrokes brisk and symmetric.  No thyromegaly.  LYMPHATICS:  No lymphadenopathy.  LUNGS:  Clear to auscultation bilaterally.  BACK:  No costovertebral angle tenderness.  CHEST:  Unremarkable.  HEART:  PMI  not displaced or sustained.  S1 and S2 within normal limits.  No  S3.  No murmurs.  ABDOMEN:  Flat.  Positive bowel sounds normal in frequency and pitch.  No  bruits, rebound, or guarding.  No midline pulsatile mass.  No hepatomegaly  or splenomegaly.  SKIN:  No rashes.  No nodules.  EXTREMITIES:  2+ pulses.  No cyanosis, clubbing, or edema.  NEUROLOGIC:  Oriented to person, place, and time.  Cranial nerves II-XII  grossly intact.  Motor grossly intact.   LABORATORY DATA:  EKG with atrial fibrillation, rate 89, axis within normal  limits, intervals within normal limits, deep inferior and anterolateral T-  wave inversions.  Chest x-ray pending.   Laboratories pending.   ASSESSMENT AND PLAN:  1. Shortness of breath.  This is the patient's predominant symptom.  This     presentation is similar to that which was bothering him on Aug 29, 2003,     when he was admitted.  He has no chest pain.  He has a profoundly     abnormal EKG, though unchanged.  Laboratories are pending.  At this     point, it is unclear whether he is simply having an acute exacerbation of     shortness of breath related to his chronic cardiomyopathy versus an     anginal equivalent.  It does appear that he had a thorough workup of the     LAD lesion at his recent catheterization to exclude this as a     possibility.  I think there is only a small chance that he is having     recurrent problems with the right coronary stented lesion.  I discussed     with Dr. Samule Ohm whether he thinks repeat catheterization would be     helpful.  It is possible that his symptoms may be related to atrial     fibrillation and that he may need more definitive therapy of this.  2. Atrial fibrillation.  The patient will be on heparin with his Coumadin     held (the heparin will be started if his INR is less than 2).  This will    be pending  decision about invasive procedure.  Given the fact that he has     persistent atrial fibrillation, he  may need treatment with amiodarone and     possible cardioversion as this may be contributing to his symptoms.  He     will continue Cardizem for rate control.                                                Rollene Rotunda, M.D.    JH/MEDQ  D:  09/03/2003  T:  09/03/2003  Job:  [id number]   cc:   Gordy Savers, M.D. [facility]

## 2010-08-22 NOTE — H&P (Signed)
NAME:  [name], [name]                  ACCOUNT NO.:  [id number]   MEDICAL RECORD NO.:  [id number]                   PATIENT TYPE:  INP   LOCATION:  1823                                 FACILITY:  [facility]   PHYSICIAN:  Valetta Mole. Swords, M.D. [facility]           DATE OF BIRTH:  01/15/1918   DATE OF ADMISSION:  11/08/2003  DATE OF DISCHARGE:                                HISTORY & PHYSICAL   CHIEF COMPLAINT:  Fall.   HISTORY OF PRESENT ILLNESS:  Mr. Corky Crafts is an 77 year old male with  multiple medical problems.  He felt quite nauseated yesterday and today.  He  got out of bed today rather suddenly going to the kitchen.  He felt very  lightheaded.  His wife stated that he became very white.  He then fell down  and hit his head on the table.  He had a significant amount of bleeding.  His wife got scared.  He came to the emergency department.  It does sound  like there was some loss of consciousness but they are quite unclear about  that.  He had no associated chest pain, shortness of breath, PND.  He does  remember feeling nauseated and lightheaded prior to falling.   PAST MEDICAL HISTORY:  1. Significant for atrial fibrillation.  2. Ischemic cardiomyopathy with an ejection fraction of 40-55%.  3. Nonsustained ventricular tachycardia.  4. Mild MR, mild AR.  5. Hypertension.  6. Hypothyroidism.  7. COPD.  8. History of duodenal ulcer.  9. Restless leg syndrome.  10.      The patient has coronary artery disease, status post stent     placement in May of 2005.  This stent was in the ostial lesion of the     right coronary artery.   PAST SURGICAL HISTORY:  1. Abdominal aortic aneurysm repair.  2. TURP.  3. Tonsillectomy.  4. He has had cervical disc surgeries x 3.  5. He has had hernia repair.  6. Appendectomy.  7. Tonsillectomy.  8. Cataract surgery.   CURRENT MEDICATIONS:  1. Synthroid 50 mcg p.o. q.d.  2. Aspirin 325 mg q.d.  3. Plavix 75 mg q.d.  4. Protonix 40 mg  p.o. q.d.  5. Cozaar 50 mg q.d.  6. Mirapex unknown dose.  7. Flomax 0.4 mg p.o. q.d.  8. Coumadin unknown dose per day.  9. Discharge summary June 2005 suggest he is on Coreg 12.5 mg b.i.d.  There     is no record of that in the records he brings in and he does not remember     taking it.   SOCIAL HISTORY:  He lives with his wife.  He is a nonsmoker (quit 35 years  ago).   ALLERGIES:  He is allergic to NOVOCAIN and MACRODANTIN.   FAMILY HISTORY:  Noncontributory.   REVIEW OF SYMPTOMS:  He denies any chest pain, shortness of breath, PND,  orthopnea, headache, back pain, abdominal pain, change in bowel movements.  Does admit to some nausea but no emesis.  Denies any lower extremity edema,  rashes, neurologic deficits, visual deficits.  He did have a significant  laceration to his forehead that has been sutured in the emergency  department.   PHYSICAL EXAMINATION:  VITAL SIGNS:  Temperature 98, pulse 70 and irregular,  blood pressure 140/70, respirations 16.  GENERAL:  He appears as an elderly male in no acute distress.  HEENT:  A laceration on his forehead.  Otherwise normocephalic and  atraumatic.  Extraocular movements intact.  NECK:  Supple without lymphadenopathy, thyromegaly, jugular venous  distention, or carotid bruits.  CHEST:  Clear to auscultation without any increase work of breathing.  CARDIOVASCULAR:  S1 and S2 are irregularly irregular with a 2/6 holosystolic  murmur at the apex.  ABDOMEN:  Thin.  Active bowel sounds.  Soft and nontender.  There is no  hepatosplenomegaly.  No masses are palpated.  I do not feel any abdominal  aneurysm.  EXTREMITIES:  There is no clubbing, cyanosis, or edema.  He does have some  abrasions to his right forearm.   LABORATORY DATA:  EKG demonstrates irregularly irregular rhythm.  CT of the  head:  There is concern for a possible subdural or epidural hematoma.  CT of  the neck:  No fracture.   Cardiac markers thus far are normal.   Electrolytes:  Sodium 133, potassium  4.2, chloride 101, glucose 110.  Hemoglobin 12.6, creatinine 0.8, AGTT 334  seconds.  INR of 3.5.   ASSESSMENT/PLAN:  1. Recent fall with laceration to the forehead, questionable subdural     hematoma:  He needs an inpatient evaluation and we will put on telemetry     bed.  Neurologic checks q.2h. x 3.  We will rule out for myocardial     infarction.  2. Given the subdural hematoma and multiple blood thinners, we will evaluate     overnight.  We will do another CT of his head in the morning.  If there     is no change, likely we will follow up as an outpatient.  3. Syncope:  I think this is vasovagal with his nausea.  This has since     resolved.  We will not further evaluate at this time but will rule him     out for myocardial infarction.                                                Bruce Rexene Edison Swords, M.D. [facility]    BHS/MEDQ  D:  11/08/2003  T:  11/08/2003  Job:  [id number]   cc:   Gordy Savers, M.D. [facility]

## 2010-08-22 NOTE — Discharge Summary (Signed)
NAME:  [name], [name] NO.:  [id number]   MEDICAL RECORD NO.:  [id number]                   PATIENT TYPE:  INP   LOCATION:  6533                                 FACILITY:  [facility]   PHYSICIAN:  Salvadore Farber, M.D. [facility]         DATE OF BIRTH:  [birth year]   DATE OF ADMISSION:  09/03/2003  DATE OF DISCHARGE:  09/06/2003                           DISCHARGE SUMMARY - REFERRING   DISCHARGE DIAGNOSES:  1. Congestive heart failure, improved.  This was felt to be secondary to     atrial fibrillation and new left ventricular dysfunction.  2. Atrial fibrillation, rate controlled.  3. Elevated troponin felt secondary to congestive heart failure.  4. Chronic obstructive pulmonary disease.  5. History of coronary artery disease, recent drug-eluting stent to the     right coronary artery.  6. Hypertension, treated.  7. Hypothyroidism, treated.  8. Nonsustained ventricular tachycardia.  9. Elevated D-dimer with a negative CT of the chest.   HOSPITAL COURSE:  [name] is an 77 year old male patient with a  known history of coronary artery disease who underwent a drug-eluting stent  to the right coronary artery on Aug 30, 2003.  He was discharged to home but  overnight has developed dyspnea at rest and somewhat better with ambulation.  He states that these symptoms are similar to his prior stent last week.  For  these reasons, he was admitted to the hospital.  Lab studies actually  revealed an elevated BNP at 856.  He was felt to be in mild CHF.  His  troponins were slightly elevated at 0.26.  This was felt to be secondary to  his CHF.  His D-dimer was 1.28.  CT of the chest was negative with the  exception of some small effusions.   The patient was admitted, placed on telemetry, and diuresed with IV Lasix.  His calcium channel blocker was stopped to eliminate negative inotropy.  If  his rate does increase over time, we may need to go ahead and increase  his  Coreg but at this time his rate has remained stable well below 100 beats per  minute.   By September 06, 2003, the patient was ready to go home.  He was able to lie flat  breathing comfortably.  His INR was 2.1.  His temperature was 96.8, blood  pressure 120/60, pulse 60, respirations 20, O2 saturation 95% on room air.  Lungs are clear.  Heart irregular.  He has no lower extremity edema and no  JVD.   MEDICATIONS:  He will be discharged to home on his same medications with the  exception of the Cardizem.  His medications at home are as follows:  1. Aspirin 325 mg a day.  2. Plavix 75 mg a day.  3. Synthroid 50 mcg a day.  4. Protonix 40 mg a day.  5. Cozaar 50 mg a day.  6. Mirapex.  7. Coreg 12.5 mg b.i.d.  8. Flomax 0.4 mg q.h.s.  9. Coumadin 2.5 mg a day.   He may use Tylenol as needed for pain, sublingual nitroglycerin p.r.n. chest  pain.  No straining or lifting over 10 pounds for the next few days until he  gradually increases his activity.  I am going to ask him to remain on the  low-fat and no-salt diet.  He needs to weigh daily, record these weights,  and bring them to his office visit.  He is to call if his weight increases  by 2-4 pounds over a one- to two-day period.  His appointment is on September 19, 2003 at 11 a.m.  At that point, we need to make him a followup appointment  with Dr. Samule Ohm.      Guy Franco, P.A. [facility]                      Salvadore Farber, M.D. [facility]    LB/MEDQ  D:  09/06/2003  T:  09/06/2003  Job:  [id number]   cc:   Salvadore Farber, M.D. [facility]  [address]  [address]  [place]  [place] [address]

## 2010-08-22 NOTE — H&P (Signed)
NAME:  [name], [name] NO.:  [id number]   MEDICAL RECORD NO.:  [id number]                   PATIENT TYPE:  INP   LOCATION:  3743                                 FACILITY:  [facility]   PHYSICIAN:  Gordy Savers, M.D. [facility]      DATE OF BIRTH:  06/27/1917   DATE OF ADMISSION:  08/29/2003  DATE OF DISCHARGE:                                HISTORY & PHYSICAL   HISTORY OF PRESENT ILLNESS:  The patient is an 77 year old white gentleman,  with an extensive history of coronary artery disease.  He has been fairly  stable until the past two weeks, when he has developed worsening shortness  of breath.  He describes worsening PND and orthopnea, and more recently  shortness of breath with exertion.  He was first seen in our office on Aug 20, 2003, complaining of shortness of breath.  He had a difficult weekend  with constipation and significant nausea and vomiting.  He complained of  sore ribs at that time, but nothing that sounded like ischemic chest pain.  His shortness of breath, which had been problematic over the weekend, was  improved.  He was next seen two days prior to admission complaining of  worsening of shortness of breath, PND and orthopnea.  A clinical examination  at that time revealed a normal oxygen saturation of 96%-97%, and clear lung  fields.  He was noted to be in atrial fibrillation and was resumed on  Coumadin, which he has taken for two days.  In addition to his atrial  fibrillation, an electrocardiogram revealed extensive ST-T wave changes, but  there is no prior electrocardiograms for comparison.  A 2-D echocardiogram  was set up at that time, which was performed.  It revealed moderate LV  dysfunction and considerable wall motion abnormalities.  The patient denied  any real ischemic pain.  He does have a documented history of coronary  artery disease, and is status post stenting of the left circumflex in 1998.  It has been several years  since his [facility] cardiologist has performed  a stress test.  On the day of admission the patient was seen as a walk-in,  complaining of worsening weakness, shortness of breath and nausea.  He is  now admitted for further evaluation and treatment of his suspected ischemic  cardiomyopathy.   PAST MEDICAL/SURGICAL HISTORY:  1. As mentioned, he has a history of coronary artery disease, status post     stenting of the left circumflex coronary artery in 1998.  2. He has a history also of peripheral vascular occlusive disease, and has     stenting of the right femoral artery also in the past.  3. Hypothyroidism.  4. Hypertension.  5. Degenerative joint disease.  6. Chronic obstructive pulmonary disease.  7. He has a remote history of peptic ulcer disease.  8. There is also a history of paroxysmal atrial fibrillation, and he had     been on Coumadin in the past.  More recently he has been on an aspirin     and Plavix regimen.  9. In 2001, he underwent an abdominal aortic aneurysm repair.  10.      He has a history of significant cervical and lumbar disk disease.     He has undergone a lumbar laminectomy in 1962, 1977, and in 2001.  He has     had cervical disk surgery in 1999.  11.      He has had a remote appendectomy.  12.      A remote tonsillectomy.  13.      Remote bilateral hernia repairs in 1968.  14.      He underwent a TURP in 1998.  15.      He was hospitalized in 1947, for a spontaneous pneumothorax.  16.      In 2001, he underwent a repair of an abdominal aortic aneurysm.   CURRENT MEDICATIONS:  1. Synthroid 0.5 mg daily.  2. Plavix 75 mg daily, on hold for the past two days.  3. Aspirin 81 mg daily, on hold for the past two days.  4. Diltiazem extended release 120 mg daily.  5. Protonix 40 mg daily.  6. More recently he has been placed on an unknown medication by his     neurologist, for a movement disorder.  7. Ambien p.r.n.  8. Clarinex p.r.n.  9. Vicodin  p.r.n.   SOCIAL HISTORY:  He is a former smoker, but quit in excess of 35 years ago.   ALLERGIES:  NOVOCAIN AND MACRODANTIN.   SOCIAL HISTORY:  He is married with two children.  One deceased from a  cerebral aneurysm.  He has recently relocated from the [facility] area,  and was first seen in our office on May 09, 2003.   FAMILY HISTORY:  History is reviewed.  His father died at age 33, of  complications of tuberculosis.  His mother died at age 62, of a myocardial  infarction.  No brothers.  Two sisters, positive for dementia, breast, and  renal cancer.   PHYSICAL EXAMINATION:  GENERAL:  A healthy-appearing thin male, who appeared  to be weak and anxious, but in no acute distress.  VITAL SIGNS:  Blood pressure 130/72, pulse regular with only occasional  ectopics.  SKIN:  Warm and dry without rash.  HEENT/NECK:  Revealed normal fundi.  Ears/nose/throat unremarkable.  Hearing  aids were in place.  No bruits or adenopathy in the neck.  CHEST:  Revealed slightly diminished breath sounds, but essentially clear.  CARDIOVASCULAR:  S1 and S2 normal.  Rhythm was essentially regular, with  occasional ectopics.  No murmur noted.  ABDOMEN:  Soft and nontender.  Surgical scar is noted.  Femoral pulses  intact.  GENITOURINARY:  External genitalia normal.  EXTREMITIES:  Revealed dorsalis pedis pulses to be full.  Posterior tibial  pulses were faint.  NEUROLOGIC:  Examination was negative.   IMPRESSION:  1. Ischemic cardiomyopathy.  2. Paroxysmal atrial fibrillation.  3. Coronary artery disease, status post stenting of the left circumflex     coronary artery.  4. Chronic obstructive pulmonary disease.  5. Hypertension.  6. Hypothyroidism.  7. Peripheral vascular occlusive disease, status post abdominal aortic     aneurysm repair.   DISPOSITION:  The patient will be admitted to a telemetry setting.  His Coumadin will be held.  He will be placed on parenteral heparin.  He will be   seen in consultation by cardiology, and will be considered  for either heart  catheterization or a Cardiolite stress test.  Serial cardiac enzymes will be  obtained.  A chest x-ray and serial electrocardiograms reviewed.                                                Gordy Savers, M.D. [facility]    PFK/MEDQ  D:  08/29/2003  T:  08/30/2003  Job:  [id number]

## 2010-08-22 NOTE — Consult Note (Signed)
NAMEPASTOR, SGRO         ACCOUNT NO.:  [id number]   MEDICAL RECORD NO.:  [id number]           PATIENT TYPE:   LOCATION:                                 FACILITY:   PHYSICIAN:  Sharlet Salina T. Hoxworth, M.D.  DATE OF BIRTH:   DATE OF CONSULTATION:  08/25/2005  DATE OF DISCHARGE:                                   CONSULTATION   CHIEF COMPLAINT:  Right groin pain and lump, nausea, and vomiting.   HISTORY OF PRESENT ILLNESS:  Mr. Rufus is a very pleasant 77 year old  white male who gives a history of bilateral inguinal hernia repair in the  1960s.  He has really had no problems with either area until today.  This  afternoon, now about a 5 or 6 hours ago he noted a painful lump about the  size of an egg in his right groin.  This was tender and persistent.  He  developed nausea and vomiting associated with this; and presented to Dr.  Isac Sarna office.  He is followed regularly by Dr. Amador Cunas.  Dr. Isac Sarna  examined the patient; and found him to have an incarcerated right inguinal  hernia.  After consultation with me, by phone, the patient is sent to [facility] Emergency Room and examined.  At the time of my evaluation; however,  the patient states the lump is gone; and he is feeling better.  He is having  no nausea, vomiting, or abdominal pain.   PAST MEDICAL HISTORY:  Surgically, Significant for bilateral inguinal hernia  repair as above.  He has also had a AAA repair several years ago,  appendectomy, and tonsillectomy.  Medically, he is followed for coronary  artery disease, cardiologist Dr. Samule Ohm.  He has had several stents placed  the last being about a year ago.  He states he has a history of congestive  heart failure as well.  Also treated for BPH.   CURRENT MEDICATIONS:  1.  Plavix 75 mg daily.  2.  Synthroid 50 mcg daily.  3.  Coreg 6.25 mg daily.  4.  Flomax 1 daily.  5.  Aspirin 81 mg daily.   ALLERGIES:  Macrodantin.   SOCIAL HISTORY:  The patient is  married and retired.  Does not smoke  cigarettes or drink alcohol.  He remains active.   FAMILY HISTORY:  Noncontributory.   REVIEW OF SYSTEMS:  GENERAL:  Positive for weight loss about a year ago, but  stable recently, but unable to regain the weight.  No fever, chills, or  malaise; energy level pretty good.  RESPIRATORY:  Denies shortness of  breath, cough, wheezing.  CARDIAC:  Denies recent chest pain, palpitations,  leg swelling.  ABDOMEN/GI:  Nausea and vomiting with the current illness.  Otherwise no abdominal pain, nausea, vomiting, or trouble with bowels.  GU:  Positive for urinary hesitancy.   PHYSICAL EXAM:  VITAL SIGNS:  Temperature is not recorded in the emergency  room.  The heart rate is 83; blood pressure 137/84; respirations 18.  GENERAL:  A thin, alert, pleasant, white, elderly male in no acute stress.  SKIN:  Warm and dry.  No  rash or infection.  HEENT:  No palpable mass or thyromegaly.  Sclerae nonicteric.  LUNGS:  Clear without wheezing or increased work of breathing.  LYMPH NODES:  No cervical, subclavicular, or inguinal nodes palpable.  CARDIAC:  Regular rhythm.  No murmurs, no edema.  ABDOMEN:  Nondistended.  Multiple well-healed incisions.  Nontender.  I  cannot feel a mass in either groin with the patient lying down.  With the  patient standing, there is a definite small reducible right inguinal hernia.  I can feel no hernia on the left.  EXTREMITIES:  No joint swelling or  deformity.  NEUROLOGIC:  Alert and oriented.  Motor and sensory exams grossly normal.   LABORATORY AND X-RAYS:  None.   ASSESSMENT/PLAN:  An 77 year old male with history of coronary artery  disease apparently stable; and now with a recurrent right inguinal hernia  and an episode of incarceration.  This has resolved spontaneously.  I do not  see any indication for emergency surgery.  He would probably benefit from  elective repair to prevent further episodes of incarceration; and likely   this could be done under local anesthesia with sedation.  I will plan to see  the patient back, in the office, in the short term and obtain records from  Dr. Amador Cunas and Dr. Samule Ohm in the meantime.  The patient is to call me  for any recurrent symptoms.      Lorne Skeens. Hoxworth, M.D.  Electronically Signed     BTH/MEDQ  D:  08/25/2005  T:  08/26/2005  Job:  [id number]   cc:   Gordy Savers, M.D. [facility]  [address] [place]  [place] [address]   Salvadore Farber, M.D. [facility]  [address]  [address]  [place]  [place] [address]

## 2010-08-22 NOTE — Discharge Summary (Signed)
NAMECHEVEZ, SAMBRANO         ACCOUNT NO.:  [id number]   MEDICAL RECORD NO.:  [id number]          PATIENT TYPE:  INP   LOCATION:  4704                         FACILITY:  [facility]   PHYSICIAN:  Rene Paci, M.D. LHCDATE OF BIRTH:  [birth year]   DATE OF ADMISSION:  03/20/2005  DATE OF DISCHARGE:  03/21/2005                                 DISCHARGE SUMMARY   DISCHARGE DIAGNOSES:  Dyspnea, likely secondary to decreased left  ventricular function with an ejection fraction of 36%.   HISTORY OF PRESENT ILLNESS:  The patient is an 77 year old male with a  history of coronary artery disease, status post previous stent placement in  the RCA with mild ischemic cardiomyopathy and an ejection fraction of 40-  50%, who over the past several weeks noticed chronic increasing shortness of  breath.  The patient was seen by his primary care Tuwanda Vokes and was sent to  the emergency room.  He notes occasional chest tightness, but this is not  like previous angina that he has had.  He has chronic two-pillow orthopnea.  The patient was admitted for further evaluation.   PAST MEDICAL HISTORY:  1.  Atrial fibrillation, has refused Coumadin in past.  2.  Ischemic cardiomyopathy.  3.  Hypertension.  4.  Hypothyroidism.  5.  COPD.  6.  Duodenal ulcer.  7.  Restless legs syndrome.  8.  Coronary artery disease, status post stent in May 2005, and two stents      in 1998.  9.  Abdominal aortic aneurysm repair.  10. Status post TURP.  11. Tonsillectomy.  12. Cervical disk surgery x3.  13. Hernia repair.  14. Appendectomy.  15. Cataract surgery.   COURSE OF HOSPITALIZATION:  SHORTNESS OF BREATH:  The patient had a Myoview performed on March 21, 2005, and was evaluated by cardiology.  The patient was seen by Dr.  Gala Romney.  He was noted to have a left ventricular ejection fraction of 36%  and no ischemia.  He was cleared for discharge to home; however, cardiology  recommended outpatient  pulmonary function tests.   MEDICATIONS AT DISCHARGE:  1.  Synthroid 0.05 mg p.o. daily.  2.  Plavix 75 mg p.o. daily.  3.  Flomax 0.4 mg p.o. daily.  4.  Protonix 40 mg p.o. daily.  5.  Aspirin 81 mg p.o. daily.   DISCHARGE LABORATORY DATA:  Hemoglobin 14.3, hematocrit 42.4.  BUN 15,  creatinine 0.9.   FOLLOW-UP:  The patient was instructed to follow up with Dr. Amador Cunas in  one to two weeks and to continue home medications as before.      Melissa S. Peggyann Juba, NP      Rene Paci, M.D. [facility]  Electronically Signed    MSO/MEDQ  D:  05/05/2005  T:  05/06/2005  Job:  [id number]   cc:   Gordy Savers, M.D. [facility]  [address] [place]  [place] [address]

## 2010-08-22 NOTE — H&P (Signed)
NAME:  [name], [name] NO.:  [id number]   MEDICAL RECORD NO.:  [id number]                   PATIENT TYPE:  INP   LOCATION:  3743                                 FACILITY:  [facility]   PHYSICIAN:  Vida Roller, M.D.                DATE OF BIRTH:  21-Aug-1917   DATE OF ADMISSION:  08/29/2003  DATE OF DISCHARGE:                                HISTORY & PHYSICAL   CARDIOLOGIST:  His cardiologist is in [facility], Dr. Marcy Panning.   PRIMARY CARE-GIVER:  Dr. Gordy Savers.   NEUROLOGIST:  Dr. Marlan Palau.   PRESENTING CIRCUMSTANCE:  I have been short of breath and fatigued  increasingly over the past 2 weeks.   HISTORY OF PRESENT ILLNESS:  [name] is an 77 year old male.  He  has a known history of paroxysmal atrial fibrillation and is lately  complaining of decreased exercise tolerance and increasing shortness of  breath.  He had a heart catheterization in 1998 with possible stent  placement; this was done in [place], [place].  Recently, the  patient has not been sleeping well and he wakes up short of breath.  He saw  Dr. Amador Cunas, Monday, Aug 27, 2003.  Electrocardiogram at that time  showed atrial fibrillation with controlled ventricular rate, heart rate of  91.  He was sent to [facility] for a 2-D echocardiogram on Aug 28, 2003; this study showed ejection fraction moderately reduced to 40% to 45%  with mild mitral regurgitation and mild aortic regurgitation.  There was a  finding of apical akinesis.  He was admitted to [facility], Aug 29, 2003, finding essentially sinus rhythm with frequent PACs, not particularly  dyspneic, but on telemetry today at 1656 hours, he had a 19-beat run of  nonsustained ventricular tachycardia.  Throughout all his complaints of  dyspnea, the patient has not had any particular complaint of chest pain,  diaphoresis, nausea or vomiting.   ALLERGIES:  DIGOXIN, the patient  claims caused severe weight loss which he  had not regained, although digoxin was stopped 1 year ago.   MEDICATIONS:  1. Synthroid 50 mcg daily.  2. Cartia XT 120 mg daily.  3. Protonix 40 mg daily.  4. Cozaar 50 mg daily.  5. Coumadin started on Aug 27, 2003 but stopped today.  6. Ambien as needed.  7. Multivitamin daily.  8. Mirapex 0.25 mg 1 in the morning, 1 at noon and 2 at bedtime.   PAST MEDICAL HISTORY:  1. Coronary artery disease, possible stenting with left heart     catheterization, 1998, [facility].  2. Stent to the right femoral artery.  3. History of paroxysmal atrial fibrillation, remote history of digoxin     therapy and Coumadin therapy, restarted Coumadin, Monday, Aug 27, 2003.  4. History of abdominal aortic aneurysm, status post resection, 2001.  5. Hypertension.  6. Hypothyroidism.  7. 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  8. Chronic obstructive pulmonary disease.  9. Degenerative joint disease.  10.      Remote history of duodenal ulcer.  11.      Cervical disk surgery plus 3 lumbar laminectomies.  12.      Bilateral inguinal herniorrhaphies.  13.      Appendectomy, tonsillectomy.  14.      TURP in 1998 for benign prostatic hypertrophy.  15.      History of spontaneous pneumothorax in 1947.  16.      Involuntary clonic spasms of unknown etiology.   SOCIAL HISTORY:  The patient lives in [place] with his wife.  He has 1  child living who is alive and well.  He has 1 daughter who died young of an  aneurysm.  He is currently retired but worked for the [employer].  He has  no current history of tobacco habituation, having quite 35-40 years ago.  He  very seldom indulges in alcoholic beverages and no recreational drugs.   FAMILY HISTORY:  Mother died at age 75 of a massive myocardial infarction.  His father died at age 40; he had a history of TB and underwent pneumothorax  surgery and died of its  complications.  Siblings:  He has 2 brothers, both  living, 1 at age 60 has history of atrial fibrillation and is status post  pacemaker placement, 1 is 60 and has Alzheimer's.   REVIEW OF SYSTEMS:  The patient denies any prior history of diabetes.  His  lipid status is unknown, although he was on Zocor for a while.  No history  of seizures, cerebrovascular accident or myocardial infarction, per the  patient's knowledge, pulmonary embolism, deep venous thrombosis or GI bleed.  The patient has been having chills, cold intolerance, weight change  secondary to digoxin, per the patient, but no fevers or sweats.  HEENT:  Denies epistaxis, voice changes leading to hoarseness, persistent vertigo or  photophobia.  No recent vision or hearing loss.  INTEGUMENT:  The patient  denies non-healing lesions, no rashes.  CARDIOPULMONARY:  The patient  recently had increasing shortness of breath with decreased exercise  tolerance.  He does have orthopnea and paroxysmal nocturnal dyspnea.  He is  aware of palpitations which are intermittent.  He feels that he has had a  history of syncope; this was momentary and happened several years ago  without workup.  UROGENITAL:  The patient has nocturia sometimes 3-4 times a  night.  NEUROPSYCHIATRIC:  The patient is fatigued lately.  MUSCULOSKELETAL:  The patient has arthralgias, particularly in the back and neck.  GI:  The  patient has a history of heartburn without water brash.  ENDOCRINE:  The  patient has cold intolerance.  Other systems are negative, as dictated.   PHYSICAL EXAMINATION:  VITAL SIGNS:  The patient is afebrile, vital signs  are stable.  GENERAL:  The patient is alert and oriented x3.  HEENT:  Normocephalic, atraumatic.  Eyes:  Pupils are equal, round and  reactive to light.  Extraocular movements are intact.  Mucous membranes are  pink and moist without lesion or erythema.  The patient wears both upper and lower dentures.  NECK:  The neck is  supple, no carotid bruits auscultated, no cervical  lymphadenopathy.  HEART:  Irregular rate and rhythm without murmur.  Telemetry shows sinus  rhythm with frequent PACs.  LUNGS:  Lungs clear to auscultation and percussion  bilaterally.  INTEGUMENT:  No rashes or lesions.  ABDOMEN:  Abdominal exam shows a well-healed abdominal midline cicatrix.  Abdomen is soft, nondistended.  Bowel sounds are present.  No  hepatosplenomegaly.  The abdominal aorta is non-pulsatile.  UROGENITAL AND RECTAL:  Exams deferred.  EXTREMITIES:  Pulses 4/4 in both radial arteries, 4/4 pulses in both the  dorsalis pedis arteries bilaterally and in the posterior tibial arteries.  There is no evidence of clubbing, cyanosis, edema or lesions.  MUSCULOSKELETAL:  The patient has no obvious joint deformities, no  costovertebral angle tenderness.  NEUROLOGIC:  No neurologic deficits.  Fine motor coordination is preserved  and moves both upper and lower extremities appropriately, however, he does  have spontaneous involuntary clonic movements of unknown etiology.   LABORATORY AND ACCESSORY CLINICAL DATA:  Chest x-ray is pending.   Electrocardiogram taken today:  Rate of 72.  Rhythm is sinus rhythm.  Axis  is +15 degrees.  P-R interval is 178.  QRS is 100.  QTc is 499.  He has  inverted T waves, V2 through V6; these were also present on  electrocardiogram, Aug 27, 2003.   LABORATORY STUDIES:  Complete blood count -- these were today:  White cells  4.6, hemoglobin 11.7, hematocrit 35.1, platelets 181,000.  Serum  electrolytes today:  Sodium 136, potassium 4.2, chloride 104, bicarbonate  27, BUN is 11, creatinine 0.8, glucose of 102.  PTT is 29, PT is 14.1, INR  1.1.  Alkaline phosphatase 61, SGOT 25, SGPT 32.  First set of cardiac  enzymes here at [facility] at 1600 hours:  CK 129, CK-MB of 6 and  troponin I 0.04.   ASSESSMENT:  1. Admitted with fatigue, dyspnea, recurrent atrial fibrillation      (paroxysmal).  2. Echocardiogram, Aug 28, 2003:  Moderately reduced ejection fraction of     40% to 45%, ischemic cardiomyopathy.  3. Nonsustained ventricular tachycardia registered on telemetry, this     admission.  4. History of abdominal aortic aneurysm, status post resection, 2001.  5. Coronary artery disease with possible stenting at catheterization in     1998.  6. Stent, right common femoral artery.  7. Hypertension.  8. Hypothyroidism.  9. Infrainguinal arterial occlusive disease.  10.      Chronic obstructive pulmonary disease.  11.      Degenerative joint disease.  12.      Involuntary clonic movements of unknown etiology.  13.      Remote duodenal ulcer.  14.      Cervical disk surgery plus 3 lumbar laminectomies.  15.      Bilateral inguinal herniorrhaphies.  16.      Status post appendectomy, status post tonsillectomy.  17.      Transurethral resection of the prostate in 1998 with benign    prostatic hypertrophy.  18.      History of spontaneous pneumothorax, 1947.   PLAN:  The plan has been defined by Dr. Vida Roller after seeing the  patient, questioning the patient, examining the patient.  Dr. Dorethea Clan agrees  the patient has congestive heart failure.  The patient also has atrial  fibrillation with rapid ventricular rate and a newly evidenced history of  nonsustained ventricular tachycardia.  Records for his history of coronary  artery disease are pending and they will be requested from [facility] on  Aug 30, 2003.  The INR of this gentleman is 1.1 at the time of admission and  he will go for a  left heart catheterization, Aug 30, 2003.  We also plan to  obtain TSH/free T4.  A chest x-ray has been ordered and is pending and after  left heart catheterization, the  patient will probably need an electrophysiology evaluation for a BiV/ICD.  Coumadin will be held and we will add Coreg, titrate upward to possibly  titrating downward on his calcium channel blocker, since  a beta blocker  would be more successful in limiting danger from nonsustained ventricular  tachycardia.      Maple Mirza, P.A.                    Vida Roller, M.D.    GM/MEDQ  D:  08/29/2003  T:  08/30/2003  Job:  [id number]

## 2010-09-02 NOTE — Discharge Summary (Signed)
NAMESHEP, PORTER NO.:  [id number]  MEDICAL RECORD NO.:  [id number]           PATIENT TYPE:  I  LOCATION:  3731                         FACILITY:  [facility]  PHYSICIAN:  Alekxander Isola, DO         DATE OF BIRTH:  July 17, 1917  DATE OF ADMISSION:  08/14/2010 DATE OF DISCHARGE:  08/16/2010                              DISCHARGE SUMMARY   ADMISSION DIAGNOSES: 1. Health-care associated right lower lobe pneumonia. 2. Acute on chronic hypercarbic respiratory failure. 3. Acute chronic obstructive pulmonary disease exacerbation. 4. History of hypothyroidism, severe protein malnutrition.  HISTORY OF PRESENT ILLNESS:  Please see H and P.  HOSPITAL COURSE:  The patient was admitted.  He was given IV vancomycin and IV Zosyn.  A urine legionella antigen was checked, which has not been returned yet.  He was evaluated by Speech Therapy and his diet was changed to a cardiac diet with nectar thick liquids and he was given aspiration precautions, which included upright posture while he is eating in for 1 hour afterwards.  [facility] evaluated the patient's hematoma and determined that it was okay to restart anticoagulation and there was no intervention required.  Today, the patient is feeling good.  He was ambulated in the halls on 2 L of O2. His sats were 94%, 2 L is usual amount of oxygen he uses at the nursing home.  The patient has been afebrile.  His white count is declining and he is appropriate for discharge.  The patient will be discharged to [facility] when they are able to take him.  DISCHARGE DIAGNOSES: 1. Respiratory failure secondary to health-care acquired pneumonia     with hypercarbia. 2. The patient has received vancomycin and Zosyn. 3. Chronic obstructive pulmonary disease with acute exacerbation. 4. History of hypothyroidism and large right inguinal hematoma.  DISCHARGE INSTRUCTIONS:  Activity as tolerated, 2 liters of O2.  DISCHARGE  MEDICATIONS: 1. Bactrim DS 1 p.o. twice daily. 2. Ensure 1 bottle by mouth 3 times daily. 3. Flora-Q 1 tablet by mouth daily. 4. Levaquin 750 mg 1 p.o. daily x5 days. 5. Synthroid 75 mcg via mouth daily before breakfast. 6. Medrol Dosepak take as directed. 7. Thick-It fluid thickener with thin liquids. 8. Avodart 0.5 mg 1 capsule by mouth daily. 9. Coreg 6.25 mg 1 tablet by mouth twice daily with meals. 10.Chloraseptic spray 2 sprays topically every 4 hours as needed for     throat pain. 11.Digoxin 0.125 one p.o. by mouth every morning. 12.DuoNeb one inhaled treatment 3 times daily as needed. 13.Lasix 40 mg one-half p.o. every Monday, Wednesday, and Friday. 14.Klor-Con 10 mEq once by mouth daily. 15.Lorazepam 0.5 mg one-half tablet by mouth twice daily as needed. 16.MiraLax 17 g by mouth twice daily as needed. 17.Nitroglycerin 0.4 mg sublingual every 5 minutes up to 3 doses as     needed for chest pain. 18.Plavix 75 mg 1 tablet p.o. daily. 19.Symbicort 80/4.5 mcg 2 puffs inhaled twice daily as needed. 20.Tamsulosin 0.4 mg 1 capsule by mouth daily. 21.Tramadol 50 mg one p.o. q.6 h. as needed for pain.  PT/OT  is to evaluate and treat the patient.  He is to follow up with his primary care doctor who is, Dr. Eleonore Chiquito, at [facility].  1. Lorazepam 0.5 mg one p.o. b.i.d. p.r.n. anxiety. 2. Coreg 6.25 mg one p.o. b.i.d. with food. 3. Ensure chocolate one p.o. t.i.d. 4. Digoxin 0.125 one p.o. daily. 5. MiraLax 17 g in 8 ounces of water twice daily p.r.n. constipation. 6. Levaquin 750 mg one tablet p.o. daily x5 days. 7. Chloraseptic spray 2 sprays q.4 h. p.r.n. throat pain. 8. Lasix 40 mg half a tablet Monday, Wednesday, and Friday. 9. Nitroglycerin 0.4 mg sublingual 1 tablet every 5 minutes p.r.n.     chest pain x3, then call EMS. 10.Tramadol 50 mg one p.o. q 6 h. p.r.n. pain. 11.DuoNeb. 12  Albuterol and Atrovent nebs q.6 h. and q.2 h. p.r.n. dyspnea. 1. Bactrim  DS 1 tablet p.o. b.i.d. x5 days. 2. Flora-Q 1 tablet p.o. daily for 10 days. 3. Thick-It instant food thickener 5 doses p.r.n. liquids. 4. Synthroid 75 mcg 1 tablet p.o. daily with food.  The patient did have a swallow evaluation.  The patient's food consistencies were changed to cardiac mechanical soft diet with nectar thick liquids.  Discharge diagnoses include respiratory failure secondary to healthcare- acquired pneumonia.  The patient will be discharged on Levaquin and Bactrim.          ______________________________ Fran Lowes, DO     AS/MEDQ  D:  08/16/2010  T:  08/16/2010  Job:  [id number]  cc:   Gordy Savers, MD  Electronically Signed by Fran Lowes DO on 09/01/2010 11:50:32 PM

## 2010-09-11 ENCOUNTER — Encounter: Payer: Self-pay | Admitting: Internal Medicine

## 2010-09-11 ENCOUNTER — Ambulatory Visit (INDEPENDENT_AMBULATORY_CARE_PROVIDER_SITE_OTHER): Payer: Medicare Other | Admitting: Internal Medicine

## 2010-09-11 DIAGNOSIS — I4891 Unspecified atrial fibrillation: Secondary | ICD-10-CM

## 2010-09-11 DIAGNOSIS — J441 Chronic obstructive pulmonary disease with (acute) exacerbation: Secondary | ICD-10-CM

## 2010-09-11 DIAGNOSIS — I2510 Atherosclerotic heart disease of native coronary artery without angina pectoris: Secondary | ICD-10-CM

## 2010-09-11 DIAGNOSIS — I10 Essential (primary) hypertension: Secondary | ICD-10-CM

## 2010-09-11 DIAGNOSIS — I5022 Chronic systolic (congestive) heart failure: Secondary | ICD-10-CM

## 2010-09-11 MED ORDER — POLYETHYLENE GLYCOL 3350 17 G PO PACK: 17.0000 g | PACK | Freq: Every day | ORAL | Status: DC

## 2010-09-11 NOTE — Progress Notes (Signed)
  Subjective:    Patient ID: [name], male    DOB: 02-14-1918, 77 y.o.   MRN: [id number]  HPI  77 year old patient who is seen today post hospital followup;  he was admitted for healthcare associated right lower lobe pneumonia and discharged to an extended care facility. He has been at home for the following week and has done reasonably well. He is on chronic oxygen therapy he has advanced COPD as well as a history of congestive heart failure and chronic atrial fibrillation. He has been on Plavix for his atrial fibrillation. Since his discharge he is exercising regularly and feels that he is improving daily. He feels that he is essentially back to baseline although he appears much more frail today;  denies any cough or shortness of breath      Review of Systems  Constitutional: Positive for fatigue. Negative for fever, chills and appetite change.  HENT: Negative for hearing loss, ear pain, congestion, sore throat, trouble swallowing, neck stiffness, dental problem, voice change and tinnitus.   Eyes: Negative for pain, discharge and visual disturbance.  Respiratory: Positive for shortness of breath. Negative for cough, chest tightness, wheezing and stridor.   Cardiovascular: Negative for chest pain, palpitations and leg swelling.  Gastrointestinal: Negative for nausea, vomiting, abdominal pain, diarrhea, constipation, blood in stool and abdominal distention.  Genitourinary: Negative for urgency, hematuria, flank pain, discharge, difficulty urinating and genital sores.  Musculoskeletal: Positive for gait problem. Negative for myalgias, back pain, joint swelling and arthralgias.  Skin: Negative for rash.  Neurological: Positive for weakness. Negative for dizziness, syncope, speech difficulty, numbness and headaches.  Hematological: Negative for adenopathy. Does not bruise/bleed easily.  Psychiatric/Behavioral: Negative for behavioral problems and dysphoric mood. The patient is not  nervous/anxious.        Objective:   Physical Exam  Constitutional: He is oriented to person, place, and time. He appears well-developed.       Appears quite weak and frail nasal oxygen in place. Blood pressure 130/82 pulse rate or possibly 70 and irregular  HENT:  Head: Normocephalic.  Right Ear: External ear normal.  Left Ear: External ear normal.  Eyes: Conjunctivae and EOM are normal.  Neck: Normal range of motion. No JVD present. No thyromegaly present.  Cardiovascular: Normal rate and normal heart sounds.  Exam reveals no gallop.   No murmur heard.      Irregular rhythm with controlled ventricular response  Pulmonary/Chest: Effort normal.       Diminished breath sounds. Unable to obtain an accurate O2 saturation mid to cool fingers  Abdominal: Soft. Bowel sounds are normal. He exhibits no distension. There is no tenderness.  Musculoskeletal: Normal range of motion. He exhibits no edema and no tenderness.  Neurological: He is alert and oriented to person, place, and time.  Psychiatric: He has a normal mood and affect. His behavior is normal.          Assessment & Plan:   Advanced COPD status post healthcare associated pneumonia Chronic systolic heart failure Chronic atrial fibrillation BPH  Medications reviewed at length and adjusted Return in one month for followup or when necessary

## 2010-09-11 NOTE — Patient Instructions (Signed)
Limit your sodium (Salt) intake    It is important that you exercise regularly.  If you develop chest pain or shortness of breath seek  medical attention.  Return in one month for follow-up

## 2010-09-12 ENCOUNTER — Telehealth: Payer: Self-pay

## 2010-09-12 NOTE — Telephone Encounter (Signed)
Called [name] from [facility] and she is aware that pt is to continue synthroid 75 mg

## 2010-09-12 NOTE — Telephone Encounter (Signed)
[name] called and stated that pt has synthroid 75 mg in home but on his medication list it is synthroid 50 mcg.  Which one should pt stay on? Please advise

## 2010-09-12 NOTE — Telephone Encounter (Signed)
Continue Synthroid 75. We'll check thyroid level at next office visit

## 2010-09-16 ENCOUNTER — Telehealth: Payer: Self-pay | Admitting: *Deleted

## 2010-09-16 NOTE — Telephone Encounter (Signed)
Wants a pill that will dry up his sinus.   His nose is dripping all the time.

## 2010-09-16 NOTE — Telephone Encounter (Signed)
Suggest allavert once daily

## 2010-09-17 MED ORDER — LORATADINE 10 MG PO TABS: 10.0000 mg | ORAL_TABLET | Freq: Every day | ORAL | Status: DC

## 2010-09-17 NOTE — Telephone Encounter (Signed)
Left message on pt's voice-mail.

## 2010-09-29 ENCOUNTER — Other Ambulatory Visit (HOSPITAL_COMMUNITY): Payer: Self-pay | Admitting: Internal Medicine

## 2010-09-29 ENCOUNTER — Telehealth: Payer: Self-pay | Admitting: *Deleted

## 2010-09-29 MED ORDER — CELECOXIB 200 MG PO CAPS: 200.0000 mg | ORAL_CAPSULE | Freq: Every day | ORAL | Status: DC

## 2010-09-29 NOTE — Telephone Encounter (Signed)
Pt would like to refill Celebrex.

## 2010-09-29 NOTE — Telephone Encounter (Signed)
Ordered placed into med list and efiled to pharmacy

## 2010-09-29 NOTE — Telephone Encounter (Signed)
200 mg  #30  RF 4

## 2010-09-30 ENCOUNTER — Ambulatory Visit (INDEPENDENT_AMBULATORY_CARE_PROVIDER_SITE_OTHER): Payer: Medicare Other | Admitting: Internal Medicine

## 2010-09-30 ENCOUNTER — Encounter: Payer: Self-pay | Admitting: Internal Medicine

## 2010-09-30 DIAGNOSIS — I5022 Chronic systolic (congestive) heart failure: Secondary | ICD-10-CM

## 2010-09-30 DIAGNOSIS — I4891 Unspecified atrial fibrillation: Secondary | ICD-10-CM

## 2010-09-30 DIAGNOSIS — I10 Essential (primary) hypertension: Secondary | ICD-10-CM

## 2010-09-30 DIAGNOSIS — IMO0002 Reserved for concepts with insufficient information to code with codable children: Secondary | ICD-10-CM

## 2010-09-30 DIAGNOSIS — J441 Chronic obstructive pulmonary disease with (acute) exacerbation: Secondary | ICD-10-CM

## 2010-09-30 DIAGNOSIS — M1990 Unspecified osteoarthritis, unspecified site: Secondary | ICD-10-CM

## 2010-09-30 MED ORDER — CEPHALEXIN 500 MG PO CAPS: 500.0000 mg | ORAL_CAPSULE | Freq: Two times a day (BID) | ORAL | Status: DC

## 2010-09-30 NOTE — Progress Notes (Signed)
  Subjective:    Patient ID: [name], male    DOB: [birth year], 76 y.o.   MRN: [id number]  HPI  77 year old patient who is seen today for followup. He has a history of advanced COPD as well as chronic systolic heart failure. He is now on chronic O2. He has been hospitalized recently for pneumonia and is presently back to baseline. He has a history of severe spinal stenosis and leg pain. This has stabilized and presently only complains of some fairly minor knee pain. He also complains of pain and swelling involving his left distal third finger. His cardiopulmonary status has been remarkably stable.    Review of Systems  Constitutional: Negative for fever, chills, appetite change and fatigue.  HENT: Negative for hearing loss, ear pain, congestion, sore throat, trouble swallowing, neck stiffness, dental problem, voice change and tinnitus.   Eyes: Negative for pain, discharge and visual disturbance.  Respiratory: Negative for cough, chest tightness, wheezing and stridor.   Cardiovascular: Negative for chest pain, palpitations and leg swelling.  Gastrointestinal: Negative for nausea, vomiting, abdominal pain, diarrhea, constipation, blood in stool and abdominal distention.  Genitourinary: Negative for urgency, hematuria, flank pain, discharge, difficulty urinating and genital sores.  Musculoskeletal: Positive for arthralgias and gait problem. Negative for myalgias, back pain and joint swelling.  Skin: Negative for rash.  Neurological: Positive for weakness. Negative for dizziness, syncope, speech difficulty, numbness and headaches.  Hematological: Negative for adenopathy. Does not bruise/bleed easily.  Psychiatric/Behavioral: Negative for behavioral problems and dysphoric mood. The patient is not nervous/anxious.        Objective:   Physical Exam  Constitutional: He is oriented to person, place, and time. He appears well-developed and well-nourished. No distress.       Blood  pressure 110/70  HENT:  Head: Normocephalic.  Right Ear: External ear normal.  Left Ear: External ear normal.  Eyes: Conjunctivae and EOM are normal.  Neck: Normal range of motion.  Cardiovascular: Normal rate, regular rhythm and normal heart sounds.        Rhythm is regular  Pulmonary/Chest: Breath sounds normal.  Abdominal: Bowel sounds are normal.  Musculoskeletal: Normal range of motion. He exhibits no edema and no tenderness.       The patient had a acute paronychia involving the medial aspect of his left third finger  Neurological: He is alert and oriented to person, place, and time.  Psychiatric: He has a normal mood and affect. His behavior is normal.          Assessment & Plan:  Paronychia left third finger. We'll treat with Keflex he will soak 3-4 times daily Advanced COPD oxygen dependent Chronic systolic heart failure DJD.

## 2010-09-30 NOTE — Patient Instructions (Signed)
Limit your sodium (Salt) intake  Take your antibiotic as prescribed until ALL of it is gone, but stop if you develop a rash, swelling, or any side effects of the medication.  Contact our office as soon as possible if  there are side effects of the medication.  Call or return to clinic prn if these symptoms worsen or fail to improve as anticipated.

## 2010-10-03 ENCOUNTER — Ambulatory Visit (HOSPITAL_COMMUNITY): Payer: Medicare Other

## 2010-10-03 ENCOUNTER — Ambulatory Visit (HOSPITAL_COMMUNITY)
Admission: RE | Admit: 2010-10-03 | Discharge: 2010-10-03 | Disposition: A | Discharge status: Home / Self Care | Payer: Medicare Other | Source: Ambulatory Visit | Attending: Internal Medicine | Admitting: Internal Medicine

## 2010-10-03 DIAGNOSIS — R1310 Dysphagia, unspecified: Secondary | ICD-10-CM | POA: Insufficient documentation

## 2010-10-03 DIAGNOSIS — Z8701 Personal history of pneumonia (recurrent): Secondary | ICD-10-CM | POA: Insufficient documentation

## 2010-10-03 DIAGNOSIS — J449 Chronic obstructive pulmonary disease, unspecified: Secondary | ICD-10-CM | POA: Insufficient documentation

## 2010-10-03 DIAGNOSIS — J4489 Other specified chronic obstructive pulmonary disease: Secondary | ICD-10-CM | POA: Insufficient documentation

## 2010-10-04 ENCOUNTER — Other Ambulatory Visit: Payer: Self-pay | Admitting: Internal Medicine

## 2010-10-10 ENCOUNTER — Other Ambulatory Visit: Payer: Self-pay

## 2010-10-10 ENCOUNTER — Ambulatory Visit: Payer: Self-pay | Admitting: Internal Medicine

## 2010-10-10 ENCOUNTER — Ambulatory Visit: Payer: Medicare Other | Admitting: Internal Medicine

## 2010-10-10 MED ORDER — POTASSIUM CHLORIDE CRYS ER 10 MEQ PO TBCR: 10.0000 meq | EXTENDED_RELEASE_TABLET | Freq: Every day | ORAL | Status: DC

## 2010-10-10 NOTE — Telephone Encounter (Signed)
Pt aware rx for KCl efiled to [facility]

## 2010-10-13 ENCOUNTER — Ambulatory Visit (INDEPENDENT_AMBULATORY_CARE_PROVIDER_SITE_OTHER): Payer: Medicare Other | Admitting: Internal Medicine

## 2010-10-13 ENCOUNTER — Other Ambulatory Visit: Payer: Self-pay

## 2010-10-13 ENCOUNTER — Encounter: Payer: Self-pay | Admitting: Internal Medicine

## 2010-10-13 VITALS — BP 110/70 | Temp 97.8°F | Wt 118.0 lb

## 2010-10-13 DIAGNOSIS — J449 Chronic obstructive pulmonary disease, unspecified: Secondary | ICD-10-CM

## 2010-10-13 DIAGNOSIS — I10 Essential (primary) hypertension: Secondary | ICD-10-CM

## 2010-10-13 DIAGNOSIS — I4891 Unspecified atrial fibrillation: Secondary | ICD-10-CM

## 2010-10-13 DIAGNOSIS — R0602 Shortness of breath: Secondary | ICD-10-CM

## 2010-10-13 DIAGNOSIS — I5022 Chronic systolic (congestive) heart failure: Secondary | ICD-10-CM

## 2010-10-13 MED ORDER — CARVEDILOL 3.125 MG PO TABS: 3.1250 mg | ORAL_TABLET | Freq: Two times a day (BID) | ORAL | Status: DC

## 2010-10-13 MED ORDER — TRAMADOL HCL 50 MG PO TABS: 50.0000 mg | ORAL_TABLET | Freq: Four times a day (QID) | ORAL | Status: DC | PRN

## 2010-10-13 MED ORDER — LORATADINE 10 MG PO TABS: 10.0000 mg | ORAL_TABLET | Freq: Every day | ORAL | Status: DC

## 2010-10-13 NOTE — Patient Instructions (Signed)
Limit your sodium (Salt) intake  Return in 3 months for follow-up   

## 2010-10-13 NOTE — Progress Notes (Signed)
  Subjective:    Patient ID: [name], male    DOB: 07-05-1917, 77 y.o.   MRN: [id number]  HPI  77 year old patient who is seen today for followup. He has a history of advanced COPD on chronic systolic heart failure. He is on Plavix for chronic atrial fibrillation. He has declined Coumadin anticoagulation. His cardiopulmonary status has been remarkably stable. He was hospitalized earlier for restart her insufficiency in the setting of community-acquired pneumonia. He is now on chronic home O2 and has done quite well. There has been some modest weight gain. He feels well today this is a routine followup visit and not an acute visit. Denies any sputum production    Review of Systems  Constitutional: Negative for fever, chills, appetite change and fatigue.  HENT: Negative for hearing loss, ear pain, congestion, sore throat, trouble swallowing, neck stiffness, dental problem, voice change and tinnitus.   Eyes: Negative for pain, discharge and visual disturbance.  Respiratory: Positive for shortness of breath. Negative for cough, chest tightness, wheezing and stridor.   Cardiovascular: Negative for chest pain, palpitations and leg swelling.  Gastrointestinal: Negative for nausea, vomiting, abdominal pain, diarrhea, constipation, blood in stool and abdominal distention.  Genitourinary: Negative for urgency, hematuria, flank pain, discharge, difficulty urinating and genital sores.  Musculoskeletal: Negative for myalgias, back pain, joint swelling, arthralgias and gait problem.  Skin: Negative for rash.  Neurological: Negative for dizziness, syncope, speech difficulty, weakness, numbness and headaches.  Hematological: Negative for adenopathy. Does not bruise/bleed easily.  Psychiatric/Behavioral: Negative for behavioral problems and dysphoric mood. The patient is not nervous/anxious.        Objective:   Physical Exam  Constitutional: He is oriented to person, place, and time. He appears  well-developed.       Elderly frail no acute distress. Nasal oxygen in place. Blood pressure 110/70  HENT:  Head: Normocephalic.  Right Ear: External ear normal.  Left Ear: External ear normal.  Eyes: Conjunctivae and EOM are normal.  Neck: Normal range of motion.  Cardiovascular: Normal rate and normal heart sounds.        Irregular rhythm with a controlled ventricular response  Pulmonary/Chest: Effort normal and breath sounds normal. No respiratory distress. He has no wheezes.       O2 saturation 93-94% on oxygen therapy  Abdominal: Bowel sounds are normal.  Musculoskeletal: Normal range of motion. He exhibits no edema and no tenderness.       No clinical edema  Neurological: He is alert and oriented to person, place, and time.  Psychiatric: He has a normal mood and affect. His behavior is normal.          Assessment & Plan:   Advanced COPD oxygen dependent. Stable Chronic atrial fibrillation. Stable on rate control medication Hypertension stable Chronic systolic heart failure. Compensated

## 2010-10-13 NOTE — Telephone Encounter (Signed)
[facility]

## 2010-10-27 ENCOUNTER — Encounter: Payer: Self-pay | Admitting: Internal Medicine

## 2010-10-27 ENCOUNTER — Ambulatory Visit (INDEPENDENT_AMBULATORY_CARE_PROVIDER_SITE_OTHER): Payer: Medicare Other | Admitting: Internal Medicine

## 2010-10-27 DIAGNOSIS — H60399 Other infective otitis externa, unspecified ear: Secondary | ICD-10-CM

## 2010-10-27 DIAGNOSIS — I4891 Unspecified atrial fibrillation: Secondary | ICD-10-CM

## 2010-10-27 DIAGNOSIS — I2510 Atherosclerotic heart disease of native coronary artery without angina pectoris: Secondary | ICD-10-CM

## 2010-10-27 DIAGNOSIS — J449 Chronic obstructive pulmonary disease, unspecified: Secondary | ICD-10-CM

## 2010-10-27 DIAGNOSIS — H6091 Unspecified otitis externa, right ear: Secondary | ICD-10-CM

## 2010-10-27 MED ORDER — NEOMYCIN-POLYMYXIN-HC 3.5-10000-1 OT SOLN: 3.0000 [drp] | Freq: Three times a day (TID) | OTIC | Status: AC

## 2010-10-27 NOTE — Patient Instructions (Signed)
Use eardrops as directed  Return in 3 months for follow-up  Call or return to clinic prn if these symptoms worsen or fail to improve as anticipated.

## 2010-10-27 NOTE — Progress Notes (Signed)
  Subjective:    Patient ID: [name], male    DOB: Nov 01, 1917, 77 y.o.   MRN: [id number]  HPI  77 year old patient seen today for followup. He has a history of oxygen-dependent COPD. He presents with a chief complaint of right ear pain of several days' duration. He does wear a hearing aid. There has been no drainage.    Review of Systems  Constitutional: Negative for fever, chills, appetite change and fatigue.  HENT: Positive for hearing loss and ear pain. Negative for congestion, sore throat, trouble swallowing, neck stiffness, dental problem, voice change and tinnitus.   Eyes: Negative for pain, discharge and visual disturbance.  Respiratory: Positive for shortness of breath. Negative for cough, chest tightness, wheezing and stridor.   Cardiovascular: Negative for chest pain, palpitations and leg swelling.  Gastrointestinal: Negative for nausea, vomiting, abdominal pain, diarrhea, constipation, blood in stool and abdominal distention.  Genitourinary: Negative for urgency, hematuria, flank pain, discharge, difficulty urinating and genital sores.  Musculoskeletal: Negative for myalgias, back pain, joint swelling, arthralgias and gait problem.  Skin: Negative for rash.  Neurological: Negative for dizziness, syncope, speech difficulty, weakness, numbness and headaches.  Hematological: Negative for adenopathy. Does not bruise/bleed easily.  Psychiatric/Behavioral: Negative for behavioral problems and dysphoric mood. The patient is not nervous/anxious.        Objective:   Physical Exam  Constitutional: He is oriented to person, place, and time. He appears well-developed.       Elderly and frail but in no acute distress. Blood pressure is normal O2 saturation 94%  HENT:  Head: Normocephalic.  Right Ear: External ear normal.  Left Ear: External ear normal.       The right canal is extremely edematous. No exudate noted  Eyes: Conjunctivae and EOM are normal.  Neck: Normal  range of motion.  Cardiovascular: Normal rate and normal heart sounds.   Pulmonary/Chest: Effort normal. No respiratory distress. He has no wheezes. He has no rales.       Breath sounds generally diminished  Abdominal: Bowel sounds are normal.  Musculoskeletal: Normal range of motion. He exhibits no edema and no tenderness.  Neurological: He is alert and oriented to person, place, and time.  Psychiatric: He has a normal mood and affect. His behavior is normal.          Assessment & Plan:   Right otitis externa Advanced COPD Coronary artery disease  We'll treat with otic drops. Will recheck in 3 months. No change in Gen. medical regimen

## 2010-11-05 ENCOUNTER — Telehealth: Payer: Self-pay | Admitting: Internal Medicine

## 2010-11-05 NOTE — Telephone Encounter (Signed)
[name] is requesting his wife ([name]) test results  ASAP; call Corey Lenzen at [phone]

## 2010-11-05 NOTE — Telephone Encounter (Signed)
Spoke with [name] - per dr. Amador Cunas - test reviewed - results ok .

## 2010-11-05 NOTE — Telephone Encounter (Signed)
No ans no mach - will try later

## 2010-11-24 ENCOUNTER — Telehealth: Payer: Self-pay

## 2010-11-24 NOTE — Telephone Encounter (Signed)
VM - difficulty breathing .  Called - spoke with pt - SOB , O2 @4 /m with very little relief - instructed to do neb tx now and again in 4 hrs. Then do 1 q8hrs and call me in the morning. Instructed that if after neb tx no releif - needs to go to ER. KIK

## 2010-11-25 ENCOUNTER — Encounter: Payer: Self-pay | Admitting: Internal Medicine

## 2010-11-25 ENCOUNTER — Ambulatory Visit (INDEPENDENT_AMBULATORY_CARE_PROVIDER_SITE_OTHER): Payer: Medicare Other | Admitting: Internal Medicine

## 2010-11-25 DIAGNOSIS — I10 Essential (primary) hypertension: Secondary | ICD-10-CM

## 2010-11-25 DIAGNOSIS — I4891 Unspecified atrial fibrillation: Secondary | ICD-10-CM

## 2010-11-25 DIAGNOSIS — J441 Chronic obstructive pulmonary disease with (acute) exacerbation: Secondary | ICD-10-CM

## 2010-11-25 DIAGNOSIS — J449 Chronic obstructive pulmonary disease, unspecified: Secondary | ICD-10-CM

## 2010-11-25 MED ORDER — BUDESONIDE-FORMOTEROL FUMARATE 160-4.5 MCG/ACT IN AERO: 2.0000 | INHALATION_SPRAY | Freq: Two times a day (BID) | RESPIRATORY_TRACT | Status: DC

## 2010-11-25 NOTE — Progress Notes (Signed)
  Subjective:    Patient ID: [name], male    DOB: 1917-07-06, 77 y.o.   MRN: [id number]  HPI  77 year old patient who has a history of advanced oxygen-dependent COPD. He is complaining of worsening shortness of breath and he is requesting pulmonary referral. He has a history of chronic atrial fibrillation but felt not to be a candidate for anticoagulation therapy;  this has been his personal request as well. He remains on Coreg for rate control and also on Plavix. He denies any productive cough. He does have a history of coronary artery disease with chronic systolic heart failure. He has had no recent peripheral edema he has hypertension which has been well-controlled. He also has a history of severe spinal stenosis but this has been fairly stable recently he has a history of gait instability.    Review of Systems  Constitutional: Negative for fever, chills, appetite change and fatigue.  HENT: Negative for hearing loss, ear pain, congestion, sore throat, trouble swallowing, neck stiffness, dental problem, voice change and tinnitus.   Eyes: Negative for pain, discharge and visual disturbance.  Respiratory: Positive for shortness of breath. Negative for cough, chest tightness, wheezing and stridor.   Cardiovascular: Negative for chest pain, palpitations and leg swelling.  Gastrointestinal: Negative for nausea, vomiting, abdominal pain, diarrhea, constipation, blood in stool and abdominal distention.  Genitourinary: Negative for urgency, hematuria, flank pain, discharge, difficulty urinating and genital sores.  Musculoskeletal: Positive for back pain. Negative for myalgias, joint swelling, arthralgias and gait problem.  Skin: Negative for rash.  Neurological: Positive for weakness. Negative for dizziness, syncope, speech difficulty, numbness and headaches.  Hematological: Negative for adenopathy. Does not bruise/bleed easily.  Psychiatric/Behavioral: Negative for behavioral problems  and dysphoric mood. The patient is not nervous/anxious.        Objective:   Physical Exam  Constitutional: He is oriented to person, place, and time. He appears well-developed.       Elderly frail alert no distress at rest;  walks with a cane  Blood pressure 100/68  HENT:  Head: Normocephalic.  Right Ear: External ear normal.  Left Ear: External ear normal.  Eyes: Conjunctivae and EOM are normal.  Neck: Normal range of motion. No JVD present.  Cardiovascular: Normal rate and normal heart sounds.        Controlled ventricular response  Pulmonary/Chest: Effort normal and breath sounds normal. No respiratory distress. He has no wheezes. He has no rales.       O2 saturation on 3 L of oxygen per minute 82% after ambulation  94% after rest   Abdominal: Soft. Bowel sounds are normal.  Musculoskeletal: Normal range of motion. He exhibits no edema and no tenderness.  Neurological: He is alert and oriented to person, place, and time.  Skin:       No cyanosis  Psychiatric: He has a normal mood and affect. His behavior is normal.          Assessment & Plan:   Advanced COPD oxygen dependent with exercise-induced desaturation Chronic systolic heart failure. Appears well compensated Chronic atrial fibrillation  We'll continue his maintenance inhalational medication as well as duo neb 4 times a day. Will schedule for pulmonary evaluation per history past. Will continue on home O2 and his present medical regimen

## 2010-11-25 NOTE — Patient Instructions (Signed)
Pulmonary follow up as discussed. Limit your sodium (Salt) intake  Return in 3 months for follow-up

## 2010-11-28 ENCOUNTER — Other Ambulatory Visit: Payer: Self-pay | Admitting: Internal Medicine

## 2010-11-28 MED ORDER — OMEPRAZOLE 20 MG PO CPDR: 20.0000 mg | DELAYED_RELEASE_CAPSULE | Freq: Every day | ORAL | Status: DC

## 2010-11-28 NOTE — Telephone Encounter (Signed)
[facility]

## 2010-12-01 ENCOUNTER — Encounter: Payer: Self-pay | Admitting: Internal Medicine

## 2010-12-01 ENCOUNTER — Ambulatory Visit (INDEPENDENT_AMBULATORY_CARE_PROVIDER_SITE_OTHER): Payer: Medicare Other | Admitting: Internal Medicine

## 2010-12-01 DIAGNOSIS — I5022 Chronic systolic (congestive) heart failure: Secondary | ICD-10-CM

## 2010-12-01 DIAGNOSIS — J449 Chronic obstructive pulmonary disease, unspecified: Secondary | ICD-10-CM

## 2010-12-01 DIAGNOSIS — I4891 Unspecified atrial fibrillation: Secondary | ICD-10-CM

## 2010-12-01 NOTE — Progress Notes (Signed)
Subjective:    Patient ID: [name], male    DOB: 06-Sep-1917, 77 y.o.   MRN: [id number]  HPI 77 year old patient who has a history of advanced oxygen-dependent COPD as well as chronic systolic heart failure. He has atrial fibrillation.  He presents today with a chief complaint of shortness of breath. He states that he considered the ED visit over the weekend due to the severe shortness of breath. He is scheduled for pulmonary evaluation tomorrow. He states that he is short of breath continuously although he does fairly well through the night. When he gets settled into bed he sleeps through the night quite well without PND or orthopnea although he does have nocturia x3 or 4. He has considerable exercise induced desaturation but this does not seem to cause any worsening dyspnea. His complaint is present at rest. He has mild nonproductive cough which has been chronic. There has been no change. He remains ambulatory  with a walker.  He remains on DuoNeb 4 times a day as well as Symbicort.  There has been no peripheral edema.  Wt Readings from Last 3 Encounters:  12/01/10 121 lb (54.885 kg)  10/27/10 120 lb (54.432 kg)  10/13/10 118 lb (53.524 kg)   He states that he was prescribed an unknown anxiolytic from a hospital visit which he does not take regularly.  Review of Systems  Constitutional: Positive for fatigue. Negative for fever, chills and appetite change.  HENT: Negative for hearing loss, ear pain, congestion, sore throat, trouble swallowing, neck stiffness, dental problem, voice change and tinnitus.   Eyes: Negative for pain, discharge and visual disturbance.  Respiratory: Positive for cough and shortness of breath. Negative for chest tightness, wheezing and stridor.   Cardiovascular: Negative for chest pain, palpitations and leg swelling.  Gastrointestinal: Negative for nausea, vomiting, abdominal pain, diarrhea, constipation, blood in stool and abdominal distention.    Genitourinary: Negative for urgency, hematuria, flank pain, discharge, difficulty urinating and genital sores.  Musculoskeletal: Positive for arthralgias and gait problem. Negative for myalgias, back pain and joint swelling.  Skin: Negative for rash.  Neurological: Positive for weakness. Negative for dizziness, syncope, speech difficulty, numbness and headaches.  Hematological: Negative for adenopathy. Does not bruise/bleed easily.  Psychiatric/Behavioral: Negative for behavioral problems and dysphoric mood. The patient is not nervous/anxious.        Objective:   Physical Exam  Constitutional: He is oriented to person, place, and time. He appears well-developed and well-nourished. No distress.       Elderly frail but in no acute distress No increased work of breathing  Blood pressure 110/70  HENT:  Head: Normocephalic.  Right Ear: External ear normal.  Left Ear: External ear normal.  Eyes: Conjunctivae and EOM are normal.  Neck: Normal range of motion.  Cardiovascular: Normal rate and normal heart sounds.        Controlled ventricular response   Pulmonary/Chest: Effort normal and breath sounds normal. No respiratory distress. He has no wheezes. He has no rales.       Chest clear although breath sounds diminished  O2 saturation on medially after ambulation to the exam room 87 O2 saturation at rest 97 with pulse rate 58 (he remains short of breath with these readings)  Abdominal: Bowel sounds are normal.  Musculoskeletal: Normal range of motion. He exhibits no edema and no tenderness.  Neurological: He is alert and oriented to person, place, and time.  Psychiatric: He has a normal mood and affect. His behavior is normal.  Assessment & Plan:  Dyspnea in a patient with advanced oxygen-dependent COPD. COPD seems compensated at this time and dyspnea is present with normal oxygen saturations. Clearly anxiety plays a role. He has been asked to take his anxiolytic twice  daily until evaluated by pulmonary tomorrow. He was asked to bring this medication with him to document.  Doubt this patient has decompensated heart failure   He has a history of severe spinal stenosis that has required narcotic analgesics in the past. He has tolerated these well although has chronic constipation issues.  Chronic atrial fibrillation. Remains on antiplatelet therapy only. Not felt to be a candidate for anticoagulation. He has declined Coumadin anticoagulation

## 2010-12-01 NOTE — Patient Instructions (Signed)
Thank you anti-anxiety medication twice daily until evaluated by pulmonary medicine  Limit your sodium (Salt) intake  Return in 3 months for follow-up

## 2010-12-02 ENCOUNTER — Other Ambulatory Visit (INDEPENDENT_AMBULATORY_CARE_PROVIDER_SITE_OTHER): Payer: Medicare Other

## 2010-12-02 ENCOUNTER — Ambulatory Visit (INDEPENDENT_AMBULATORY_CARE_PROVIDER_SITE_OTHER): Payer: Medicare Other | Admitting: Internal Medicine

## 2010-12-02 ENCOUNTER — Encounter: Payer: Self-pay | Admitting: Internal Medicine

## 2010-12-02 ENCOUNTER — Ambulatory Visit (INDEPENDENT_AMBULATORY_CARE_PROVIDER_SITE_OTHER)
Admission: RE | Admit: 2010-12-02 | Discharge: 2010-12-02 | Disposition: A | Discharge status: Home / Self Care | Payer: Medicare Other | Source: Ambulatory Visit | Attending: Internal Medicine | Admitting: Internal Medicine

## 2010-12-02 VITALS — BP 150/80 | HR 70 | Temp 98.2°F | Ht 71.0 in | Wt 120.8 lb

## 2010-12-02 DIAGNOSIS — R0609 Other forms of dyspnea: Secondary | ICD-10-CM

## 2010-12-02 DIAGNOSIS — R0600 Dyspnea, unspecified: Secondary | ICD-10-CM

## 2010-12-02 DIAGNOSIS — M954 Acquired deformity of chest and rib: Secondary | ICD-10-CM

## 2010-12-02 DIAGNOSIS — J449 Chronic obstructive pulmonary disease, unspecified: Secondary | ICD-10-CM

## 2010-12-02 DIAGNOSIS — I10 Essential (primary) hypertension: Secondary | ICD-10-CM

## 2010-12-02 LAB — CBC WITH DIFFERENTIAL/PLATELET
Basophils Absolute: 0 10*3/uL (ref 0.0–0.1)
Basophils Relative: 0.5 % (ref 0.0–3.0)
Eosinophils Absolute: 0.1 10*3/uL (ref 0.0–0.7)
HCT: 36.8 % — ABNORMAL LOW (ref 39.0–52.0)
Hemoglobin: 12 g/dL — ABNORMAL LOW (ref 13.0–17.0)
Lymphocytes Relative: 22.3 % (ref 12.0–46.0)
Lymphs Abs: 1.3 10*3/uL (ref 0.7–4.0)
MCHC: 32.7 g/dL (ref 30.0–36.0)
Monocytes Relative: 7.9 % (ref 3.0–12.0)
Neutro Abs: 3.9 10*3/uL (ref 1.4–7.7)
RBC: 4.39 Mil/uL (ref 4.22–5.81)
RDW: 14.6 % (ref 11.5–14.6)

## 2010-12-02 LAB — HEPATIC FUNCTION PANEL
ALT: 11 U/L (ref 0–53)
AST: 16 U/L (ref 0–37)
Albumin: 3.2 g/dL — ABNORMAL LOW (ref 3.5–5.2)
Alkaline Phosphatase: 62 U/L (ref 39–117)
Bilirubin, Direct: 0.1 mg/dL (ref 0.0–0.3)
Total Protein: 6.3 g/dL (ref 6.0–8.3)

## 2010-12-02 LAB — BASIC METABOLIC PANEL
BUN: 17 mg/dL (ref 6–23)
Chloride: 101 mEq/L (ref 96–112)
Glucose, Bld: 96 mg/dL (ref 70–99)
Potassium: 4.2 mEq/L (ref 3.5–5.1)
Sodium: 140 mEq/L (ref 135–145)

## 2010-12-02 MED ORDER — NEBIVOLOL HCL 5 MG PO TABS: 5.0000 mg | ORAL_TABLET | Freq: Every day | ORAL | Status: DC

## 2010-12-02 MED ORDER — FAMOTIDINE 20 MG PO TABS: ORAL_TABLET | ORAL | Status: DC

## 2010-12-02 MED ORDER — OMEPRAZOLE 20 MG PO CPDR: DELAYED_RELEASE_CAPSULE | ORAL | Status: DC

## 2010-12-02 NOTE — Patient Instructions (Signed)
Change prilosec (omeprazole) to 20 mg Take 30- 60 min before your first and last meals of the day and add pepcid 20 mg one at bedtime  GERD (REFLUX)  is an extremely common cause of respiratory symptoms, many times with no significant heartburn at all.    It can be treated with medication, but also with lifestyle changes including avoidance of late meals, excessive alcohol, smoking cessation, and avoid fatty foods, chocolate, peppermint, colas, red wine, and acidic juices such as orange juice.  NO MINT OR MENTHOL PRODUCTS SO NO COUGH DROPS  USE SUGARLESS CANDY INSTEAD (jolley ranchers or Stover's)  NO OIL BASED VITAMINS  Stop coreg  Start bystolic 5 mg one daily   See Tammy NP w/in 2 weeks with all your medications, even over the counter meds, separated in two separate bags, the ones you take no matter what vs the ones you stop once you feel better and take only as needed when you feel you need them.   Tammy  will generate for you a new user friendly medication calendar that will put [place] all on the same page re: your medication use.     Without this process, it simply isn't possible to assure that we are providing  your outpatient care  with  the attention to detail we feel you deserve.   If we cannot assure that you're getting that kind of care,  then we cannot manage your problem effectively from this clinic.  Once you have seen Tammy and we are sure that we're all on the same page with your medication use she will arrange follow up with me.

## 2010-12-02 NOTE — Progress Notes (Signed)
  Subjective:    Patient ID: [name], male    DOB: 1917/10/11, 77 y.o.   MRN: [id number]  HPI  67 yowm quit smoking 1945 with onset breathing difficulty around 2010 then bad to worse starting August 2012 so referred by K to pulmonary clinic.  12/02/2010 Initial pulmonary office eval cc doe x sev years much worse x sev weeks on 02 24 hours per day 2-4lpm assoc with minimal mucus production and sense of choking when swallowing and uses thickeners which help.  Assoc with nasal congestion, sense of pnds but not purulent sputum. Sleeping ok without nocturnal  or early am exac of resp c/o's or need for noct saba.  Apparently using duoneb as a maint, not sure it's helping.  Sob > room to room at present even on 4lpm  Pt denies any significant sore throat, dysphagia, itching, sneezing,  nasal congestion or excess/ purulent secretions,  fever, chills, sweats, unintended wt loss, pleuritic or exertional cp, hempoptysis, orthopnea pnd or leg swelling.    Also denies any obvious fluctuation of symptoms with weather or environmental changes or other aggravating or alleviating factors.     Review of Systems  Constitutional: Negative for fever, chills, activity change, appetite change and unexpected weight change.  HENT: Positive for congestion, rhinorrhea, trouble swallowing and postnasal drip. Negative for sore throat, sneezing, dental problem and voice change.   Eyes: Negative for visual disturbance.  Respiratory: Positive for cough and shortness of breath. Negative for choking.   Cardiovascular: Negative for chest pain and leg swelling.  Gastrointestinal: Negative for nausea, vomiting and abdominal pain.  Genitourinary: Negative for difficulty urinating.  Musculoskeletal: Negative for arthralgias.  Skin: Negative for rash.  Psychiatric/Behavioral: Negative for behavioral problems and confusion.       Objective:   Physical Exam Frail elderly wm nad 12/02/2010  Wt  120 HEENT mild  turbinate edema.  Oropharynx no thrush or excess pnd or cobblestoning.  No JVD or cervical adenopathy. Mild accessory muscle hypertrophy. Trachea midline, nl thryroid. Chest was hyperinflated by percussion with diminished breath sounds and moderate increased exp time without wheeze. Hoover sign positive at mid inspiration. Regular rate and rhythm without murmur gallop or rub or increase P2 or edema.  Abd: no hsm, nl excursion. Ext warm without cyanosis or clubbing.       CXR  12/02/2010 :  Hyperinflation and emphysematous changes.  Left basilar densities suggest a small pleural effusion and/or pleural thickening.   Assessment & Plan:

## 2010-12-02 NOTE — Assessment & Plan Note (Signed)
DDX of  difficult airways managment all start with A and  include Adherence, Ace Inhibitors, Acid Reflux, Active Sinus Disease, Alpha 1 Antitripsin deficiency, Anxiety masquerading as Airways dz,  ABPA,  allergy(esp in young), Aspiration (esp in elderly), Adverse effects of DPI,  Active smokers, plus two Bs  = Bronchiectasis and Beta blocker use..and one C= CHF   Adherence is always the initial "prime suspect" and is a multilayered concern that requires a "trust but verify" approach in every patient - starting with knowing how to use medications, especially inhalers, correctly, keeping up with refills and understanding the fundamental difference between maintenance and prns vs those medications only taken for a very short course and then stopped and not refilled.  To keep things simple, I have asked the patient to first separate medicines that are perceived as maintenance, that is to be taken daily "no matter what", from those medicines that are taken on only on an as-needed basis and I have given the patient examples of both, and then return to see our NP to generate a  detailed  medication calendar which should be followed until the next physician sees the patient and updates it.    ? Acid reflux vs aspiration > See instructions for specific recommendations which were reviewed directly with the patient who was given a copy with highlighter outlining the key components.   ? Beta blocker effects > Strongly prefer in this setting: Bystolic, the most beta -1  selective Beta blocker available in sample form, with bisoprolol the most selective generic choice  on the market.

## 2010-12-03 NOTE — Progress Notes (Signed)
Quick Note:  Spoke with pt and notified of results per Dr. Wert. Pt verbalized understanding and denied any questions.  ______ 

## 2010-12-04 ENCOUNTER — Other Ambulatory Visit: Payer: Self-pay

## 2010-12-04 MED ORDER — LORAZEPAM 0.5 MG PO TABS: 0.5000 mg | ORAL_TABLET | Freq: Two times a day (BID) | ORAL | Status: DC

## 2010-12-04 NOTE — Telephone Encounter (Signed)
[facility] 

## 2010-12-09 ENCOUNTER — Other Ambulatory Visit: Payer: Self-pay | Admitting: Internal Medicine

## 2010-12-16 ENCOUNTER — Other Ambulatory Visit: Payer: Self-pay | Admitting: *Deleted

## 2010-12-16 ENCOUNTER — Encounter: Payer: Self-pay | Admitting: Adult Health

## 2010-12-16 ENCOUNTER — Ambulatory Visit (INDEPENDENT_AMBULATORY_CARE_PROVIDER_SITE_OTHER): Payer: Medicare Other | Admitting: Adult Health

## 2010-12-16 VITALS — BP 110/60 | HR 83 | Temp 97.5°F | Ht 71.0 in | Wt 123.2 lb

## 2010-12-16 DIAGNOSIS — J449 Chronic obstructive pulmonary disease, unspecified: Secondary | ICD-10-CM

## 2010-12-16 DIAGNOSIS — M954 Acquired deformity of chest and rib: Secondary | ICD-10-CM

## 2010-12-16 MED ORDER — NEBIVOLOL HCL 5 MG PO TABS: 5.0000 mg | ORAL_TABLET | Freq: Every day | ORAL | Status: DC

## 2010-12-16 NOTE — Progress Notes (Signed)
  Subjective:    Patient ID: [name], male    DOB: 25-Oct-1917, 77 y.o.   MRN: [id number]  HPI  79 yowm quit smoking 1945 with onset breathing difficulty around 2010 then bad to worse starting August 2012 so referred by K to pulmonary clinic.  12/02/2010 Initial pulmonary office eval cc doe x sev years much worse x sev weeks on 02 24 hours per day 2-4lpm assoc with minimal mucus production and sense of choking when swallowing and uses thickeners which help.  Assoc with nasal congestion, sense of pnds but not purulent sputum. Sleeping ok without nocturnal  or early am exac of resp c/o's or need for noct saba.  Apparently using duoneb as a maint, not sure it's helping.  Sob > room to room at present even on 4lpm >>Rx Prilosec daily and changed coreg to bystolic   12/16/2010 Follow up and med review Pt returns with family for follow up and med review. Last ov he was changed from coreg to bystolic. Rx PPI daily . Labs were essentially unrevealing w/ minimally elevated BNP ~200, ESR nml . TSH was nml.  He has brought all his meds today which we reviewed and organized into a med calendar with pt education Since last visit he does feel slightly better with less dyspnea.    Review of Systems  Constitutional: Negative for fever, chills, activity change, appetite change and unexpected weight change.  HENT: Positive for congestion, rhinorrhea, trouble swallowing and postnasal drip. Negative for sore throat, sneezing, dental problem and voice change.   Eyes: Negative for visual disturbance.  Respiratory: Positive for cough and shortness of breath. Negative for choking.   Cardiovascular: Negative for chest pain and leg swelling.  Gastrointestinal: Negative for nausea, vomiting and abdominal pain.  Genitourinary: Negative for difficulty urinating.  Musculoskeletal: Negative for arthralgias.  Skin: Negative for rash.  Psychiatric/Behavioral: Negative for behavioral problems and confusion.        Objective:   Physical Exam Frail elderly wm nad 12/02/2010  Wt  120>> HEENT mild turbinate edema.  Oropharynx no thrush or excess pnd or cobblestoning.  No JVD or cervical adenopathy. Mild accessory muscle hypertrophy. Trachea midline, nl thryroid. Chest was hyperinflated by percussion with diminished breath sounds and moderate increased exp time without wheeze. Hoover sign positive at mid inspiration. Regular rate and rhythm without murmur gallop or rub or increase P2 or edema.  Abd: no hsm, nl excursion. Ext warm without cyanosis or clubbing.       CXR  12/02/2010 :  Hyperinflation and emphysematous changes.  Left basilar densities suggest a small pleural effusion and/or pleural thickening.   Assessment & Plan:

## 2010-12-16 NOTE — Patient Instructions (Signed)
Continue on Bystolic 5 mg one daily  Follow med calendar closely and bring to each visit.  follow up Dr. Sherene Sires  In 4 weeks and As needed

## 2010-12-16 NOTE — Assessment & Plan Note (Addendum)
Improved compensation with change of nonselective beta blocker to bystolic and  GERD prevention regimen.  Patient's medications were reviewed today and patient education was given. Computerized medication calendar was adjusted/completed  if improved on return consider stopping one of the stomach meds to help simplify regimen.   Plan;  Cont on current regimen.  follow up Dr. Sherene Sires  In 4 weeks and As needed

## 2010-12-23 ENCOUNTER — Emergency Department (HOSPITAL_COMMUNITY): Payer: Medicare Other

## 2010-12-23 ENCOUNTER — Inpatient Hospital Stay (HOSPITAL_COMMUNITY)
Admission: EM | Admit: 2010-12-23 | Discharge: 2010-12-30 | DRG: 293 | Disposition: A | Discharge status: Home Health | Payer: Medicare Other | Attending: Internal Medicine | Admitting: Internal Medicine

## 2010-12-23 DIAGNOSIS — Z8673 Personal history of transient ischemic attack (TIA), and cerebral infarction without residual deficits: Secondary | ICD-10-CM

## 2010-12-23 DIAGNOSIS — I509 Heart failure, unspecified: Secondary | ICD-10-CM | POA: Diagnosis present

## 2010-12-23 DIAGNOSIS — I959 Hypotension, unspecified: Secondary | ICD-10-CM | POA: Diagnosis not present

## 2010-12-23 DIAGNOSIS — K219 Gastro-esophageal reflux disease without esophagitis: Secondary | ICD-10-CM | POA: Diagnosis present

## 2010-12-23 DIAGNOSIS — I10 Essential (primary) hypertension: Secondary | ICD-10-CM | POA: Diagnosis present

## 2010-12-23 DIAGNOSIS — I2510 Atherosclerotic heart disease of native coronary artery without angina pectoris: Secondary | ICD-10-CM | POA: Diagnosis present

## 2010-12-23 DIAGNOSIS — Z79899 Other long term (current) drug therapy: Secondary | ICD-10-CM

## 2010-12-23 DIAGNOSIS — E039 Hypothyroidism, unspecified: Secondary | ICD-10-CM | POA: Diagnosis present

## 2010-12-23 DIAGNOSIS — R5381 Other malaise: Secondary | ICD-10-CM | POA: Diagnosis present

## 2010-12-23 DIAGNOSIS — M4800 Spinal stenosis, site unspecified: Secondary | ICD-10-CM | POA: Diagnosis present

## 2010-12-23 DIAGNOSIS — M1990 Unspecified osteoarthritis, unspecified site: Secondary | ICD-10-CM | POA: Diagnosis present

## 2010-12-23 DIAGNOSIS — Z7902 Long term (current) use of antithrombotics/antiplatelets: Secondary | ICD-10-CM

## 2010-12-23 DIAGNOSIS — N401 Enlarged prostate with lower urinary tract symptoms: Secondary | ICD-10-CM | POA: Diagnosis present

## 2010-12-23 DIAGNOSIS — J449 Chronic obstructive pulmonary disease, unspecified: Secondary | ICD-10-CM | POA: Diagnosis present

## 2010-12-23 DIAGNOSIS — I5030 Unspecified diastolic (congestive) heart failure: Principal | ICD-10-CM | POA: Diagnosis present

## 2010-12-23 DIAGNOSIS — I4891 Unspecified atrial fibrillation: Secondary | ICD-10-CM | POA: Diagnosis present

## 2010-12-23 DIAGNOSIS — R1032 Left lower quadrant pain: Secondary | ICD-10-CM | POA: Diagnosis not present

## 2010-12-23 DIAGNOSIS — R1310 Dysphagia, unspecified: Secondary | ICD-10-CM | POA: Diagnosis present

## 2010-12-23 DIAGNOSIS — J4489 Other specified chronic obstructive pulmonary disease: Secondary | ICD-10-CM | POA: Diagnosis present

## 2010-12-23 DIAGNOSIS — Z8711 Personal history of peptic ulcer disease: Secondary | ICD-10-CM

## 2010-12-23 DIAGNOSIS — I739 Peripheral vascular disease, unspecified: Secondary | ICD-10-CM | POA: Diagnosis present

## 2010-12-23 DIAGNOSIS — N320 Bladder-neck obstruction: Secondary | ICD-10-CM | POA: Diagnosis present

## 2010-12-23 DIAGNOSIS — N138 Other obstructive and reflux uropathy: Secondary | ICD-10-CM | POA: Diagnosis present

## 2010-12-23 LAB — DIFFERENTIAL
Basophils Absolute: 0 10*3/uL (ref 0.0–0.1)
Basophils Relative: 0 % (ref 0–1)
Eosinophils Absolute: 0.1 10*3/uL (ref 0.0–0.7)
Eosinophils Relative: 2 % (ref 0–5)
Lymphocytes Relative: 18 % (ref 12–46)
Monocytes Absolute: 0.5 10*3/uL (ref 0.1–1.0)

## 2010-12-23 LAB — BASIC METABOLIC PANEL
Calcium: 9.1 mg/dL (ref 8.4–10.5)
Creatinine, Ser: 0.54 mg/dL (ref 0.50–1.35)
GFR calc Af Amer: >60 mL/min (ref >60)

## 2010-12-23 LAB — TROPONIN I: Troponin I: <0.3 ng/mL (ref <0.30)

## 2010-12-23 LAB — CBC
MCH: 27 pg (ref 26.0–34.0)
MCV: 84.2 fL (ref 78.0–100.0)
Platelets: 152 10*3/uL (ref 150–400)
RDW: 14 % (ref 11.5–15.5)

## 2010-12-24 ENCOUNTER — Inpatient Hospital Stay (HOSPITAL_COMMUNITY): Payer: Medicare Other

## 2010-12-24 ENCOUNTER — Telehealth: Payer: Self-pay | Admitting: Internal Medicine

## 2010-12-24 DIAGNOSIS — I359 Nonrheumatic aortic valve disorder, unspecified: Secondary | ICD-10-CM

## 2010-12-24 LAB — DIFFERENTIAL
Basophils Absolute: 0 10*3/uL (ref 0.0–0.1)
Basophils Relative: 0 % (ref 0–1)
Eosinophils Relative: 0 % (ref 0–5)
Lymphocytes Relative: 7 % — ABNORMAL LOW (ref 12–46)
Monocytes Absolute: 0.7 10*3/uL (ref 0.1–1.0)

## 2010-12-24 LAB — CBC
HCT: 39 % (ref 39.0–52.0)
MCH: 26.1 pg (ref 26.0–34.0)
MCHC: 31.3 g/dL (ref 30.0–36.0)
RDW: 14.1 % (ref 11.5–15.5)

## 2010-12-24 LAB — TSH: TSH: 0.974 u[IU]/mL (ref 0.350–4.500)

## 2010-12-24 LAB — MAGNESIUM: Magnesium: 1.9 mg/dL (ref 1.5–2.5)

## 2010-12-24 LAB — COMPREHENSIVE METABOLIC PANEL
Albumin: 2.9 g/dL — ABNORMAL LOW (ref 3.5–5.2)
Alkaline Phosphatase: 104 U/L (ref 39–117)
BUN: 22 mg/dL (ref 6–23)
Calcium: 9.2 mg/dL (ref 8.4–10.5)
Potassium: 3.8 mEq/L (ref 3.5–5.1)
Total Protein: 6.2 g/dL (ref 6.0–8.3)

## 2010-12-24 LAB — T3, FREE: T3, Free: 1.5 pg/mL — ABNORMAL LOW (ref 2.3–4.2)

## 2010-12-24 LAB — PRO B NATRIURETIC PEPTIDE: Pro B Natriuretic peptide (BNP): 13028 pg/mL — ABNORMAL HIGH (ref 0–450)

## 2010-12-24 LAB — T4, FREE: Free T4: 1.2 ng/dL (ref 0.80–1.80)

## 2010-12-24 NOTE — Telephone Encounter (Signed)
Pt was admitted to [facility] long hosp on 9-16 due to sob. Pt is in room#1433

## 2010-12-25 ENCOUNTER — Encounter (HOSPITAL_COMMUNITY): Payer: Self-pay

## 2010-12-25 ENCOUNTER — Inpatient Hospital Stay (HOSPITAL_COMMUNITY): Payer: Medicare Other

## 2010-12-25 LAB — CBC
Hemoglobin: 11.4 g/dL — ABNORMAL LOW (ref 13.0–17.0)
MCH: 26.4 pg (ref 26.0–34.0)
MCV: 84.7 fL (ref 78.0–100.0)
Platelets: 180 10*3/uL (ref 150–400)
RBC: 4.32 MIL/uL (ref 4.22–5.81)
WBC: 12.3 10*3/uL — ABNORMAL HIGH (ref 4.0–10.5)

## 2010-12-25 LAB — URINALYSIS, ROUTINE W REFLEX MICROSCOPIC
Bilirubin Urine: NEGATIVE
Ketones, ur: NEGATIVE mg/dL
Nitrite: NEGATIVE
Protein, ur: NEGATIVE mg/dL
Urobilinogen, UA: 0.2 mg/dL (ref 0.0–1.0)

## 2010-12-25 LAB — COMPREHENSIVE METABOLIC PANEL
ALT: 42 U/L (ref 0–53)
AST: 21 U/L (ref 0–37)
CO2: 33 mEq/L — ABNORMAL HIGH (ref 19–32)
Calcium: 9.1 mg/dL (ref 8.4–10.5)
Chloride: 96 mEq/L (ref 96–112)
Creatinine, Ser: 1.15 mg/dL (ref 0.50–1.35)
GFR calc Af Amer: >60 mL/min (ref >60)
GFR calc non Af Amer: 60 mL/min — ABNORMAL LOW (ref >60)
Glucose, Bld: 150 mg/dL — ABNORMAL HIGH (ref 70–99)
Sodium: 135 mEq/L (ref 135–145)
Total Bilirubin: 0.5 mg/dL (ref 0.3–1.2)

## 2010-12-25 LAB — DIFFERENTIAL
Lymphs Abs: 1 10*3/uL (ref 0.7–4.0)
Monocytes Relative: 7 % (ref 3–12)
Neutro Abs: 10.4 10*3/uL — ABNORMAL HIGH (ref 1.7–7.7)
Neutrophils Relative %: 85 % — ABNORMAL HIGH (ref 43–77)

## 2010-12-25 LAB — DIGOXIN LEVEL: Digoxin Level: 0.5 ng/mL — ABNORMAL LOW (ref 0.8–2.0)

## 2010-12-25 MED ORDER — IOHEXOL 300 MG/ML  SOLN
80.0000 mL | Freq: Once | INTRAMUSCULAR | Status: AC | PRN
  Administered 2010-12-25: 80 mL via INTRAVENOUS

## 2010-12-26 LAB — COMPREHENSIVE METABOLIC PANEL
Albumin: 2.4 g/dL — ABNORMAL LOW (ref 3.5–5.2)
Alkaline Phosphatase: 72 U/L (ref 39–117)
BUN: 56 mg/dL — ABNORMAL HIGH (ref 6–23)
Calcium: 9 mg/dL (ref 8.4–10.5)
GFR calc Af Amer: 59 mL/min — ABNORMAL LOW (ref >60)
Glucose, Bld: 108 mg/dL — ABNORMAL HIGH (ref 70–99)
Potassium: 4.2 mEq/L (ref 3.5–5.1)
Total Protein: 5.5 g/dL — ABNORMAL LOW (ref 6.0–8.3)

## 2010-12-26 LAB — CBC
HCT: 33.6 % — ABNORMAL LOW (ref 39.0–52.0)
Hemoglobin: 10.8 g/dL — ABNORMAL LOW (ref 13.0–17.0)
MCHC: 32.1 g/dL (ref 30.0–36.0)
MCV: 84.2 fL (ref 78.0–100.0)
RDW: 14.5 % (ref 11.5–15.5)

## 2010-12-26 LAB — MAGNESIUM: Magnesium: 2.1 mg/dL (ref 1.5–2.5)

## 2010-12-26 LAB — DIFFERENTIAL
Basophils Relative: 0 % (ref 0–1)
Eosinophils Absolute: 0.1 10*3/uL (ref 0.0–0.7)
Neutro Abs: 6.9 10*3/uL (ref 1.7–7.7)
Neutrophils Relative %: 74 % (ref 43–77)

## 2010-12-26 NOTE — Discharge Summary (Signed)
[name], [name] NO.:  [id number]  MEDICAL RECORD NO.:  [id number]  LOCATION:  1433                         FACILITY:  [facility]  PHYSICIAN:  Talmage Nap, MD  DATE OF BIRTH:  1917-05-28  DATE OF ADMISSION:  12/23/2010 DATE OF DISCHARGE:  Unknown                        PROGRESS-NOTE   PRIMARY CARE PHYSICIAN:  Dr. Eleonore Chiquito.  DISCHARGE DIAGNOSES: 1. Congestive heart failure (questionable diastolic dysfunction). 2. Bladder outlet obstruction, status post bladder catheterization.     The patient will be on chronic bladder catheterization until     reevaluated by his primary care physician. 3. Subclinical hypothyroidism. 4. Chronic atrial fibrillation. 5. Hypertension. 6. Chronic obstructive pulmonary disease. 7. Coronary artery disease. 8. Gastroesophageal reflux disease. 9. Peripheral vascular disease. 10.Benign prostatic hyperplasia. 11.Degenerative joint disease. 12.History of peptic ulcer disease. 13.History of spinal stenosis.  The patient is a 77 year old Caucasian male with history of COPD, on home O2 as well as congestive heart failure, was admitted to the hospital on December 23, 2010 by Dr. Houston Siren with a 2-week history of progressive shortness of breath and orthopnea and symptoms were said to be getting worse.  He denied any fever, chills, or rigor.  He denied any swelling of the lower extremities.  He denied any nausea or vomiting. Symptoms were however, said to be getting worse.  Hence he presented to the hospital to be evaluated.  His preadmission medications include, 1. Ativan 0.5 mg p.o. b.i.d. 2. Flomax 0.4 mg p.o. daily. 3. Prilosec 20 mg p.o. daily. 4. Claritin 10 mg p.o. daily. 5. Ultram 50 mg p.o. b.i.d. 6. Celebrex 200 mg p.o. daily. 7. KCl 10 mEq p.o. daily. 8. Plavix, dose is unknown. 9. Bystolic 5 mg p.o. daily. 10.Synthroid 75 mcg p.o. daily. 11.Lasix 20 mg p.o. daily. 12.Digoxin 125 mcg p.o.  daily. 13.Avodart 0.5 mg p.o. daily.  ALLERGIES:  NOVOCAIN.  PAST SURGICAL HISTORY: 1. AAA repair. 2. Hernia repair.  SOCIAL HISTORY:  Negative for alcohol or tobacco use.  FAMILY HISTORY AND REVIEW OF SYSTEMS:  Essentially documented in the initial history and physical.  PHYSICAL EXAMINATION:  VITAL SIGNS:  At the time, the patient was seen by the admitting physician, vital signs, blood pressure 160/70, repeat was 150/70, pulse 80, respiratory rate 16, and temperature is 98.8. GENERAL:  He was said not to be in any distress. HEENT:  Pupils were reactive to light and extraocular muscles are intact. NECK:  Increasing jugular venous pressure. HEART:  Heart sounds are irregularly irregular. CHEST:  Showed bilateral crackles at the lung bases. ABDOMEN:  Soft and nontender.  Liver, spleen, and kidney not palpable. Bowel sounds are positive. EXTREMITIES:  Showed no pedal edema. NEUROLOGIC:  Nonfocal. MUSCULOSKELETAL SYSTEM:  Show arthritic changes in the knees and feet. NEUROPSYCHIATRIC:  Evaluation is unremarkable. SKIN:  Warm and dry.  LABORATORY DATA:  Initial complete blood count differential showed WBC of 6.5, hemoglobin of 11.1, hematocrit of 34.6, MCV 84.2 with a platelet count of 152.  Normal differentials.  Basic metabolic panel showed sodium of 134, potassium of 4.3, chloride of 97 with a bicarbonate of 30, glucose is 110, BUN is 18, and creatinine 0.54.  First set of cardiac marker, troponin-I less than 0.30.  His proBNP 5057.  Complete blood count with differential done on December 24, 2010, showed WBC of 13.4, hemoglobin of 12.2, hematocrit of 39.0, MCV of 83.3, platelet count of 189, neutrophils 88%, and absolute neutrophil count is 11.7. Comprehensive metabolic panel showed sodium of 138, potassium of 3.8, chloride of 90 with a bicarbonate of 30, glucose is 132, BUN is 22, and creatinine 0.76.  Magnesium level is 1.9.  Repeat proBNP done on December 24, 2010,13000.  Thyroid panel, TSH 0.974, T4 of 1.20, and T3 of 1.5, markedly low.  Urinalysis unremarkable.  Urine microscopy unremarkable.  Digoxin level is 0.5 and a repeat complete blood count with differential done on December 26, 2010, showed WBC of 9.3, hemoglobin of 10.8, hematocrit of 33.6, MCV 84.2 with a platelet count of 160.  Normal differential.  Magnesium level is 2.1 and comprehensive metabolic panel showed sodium of 137, potassium of 4.2, chloride of 99 with a bicarbonate of 32, glucose is 108, BUN is 56, and creatinine is 1.37.  IMAGING STUDIES:  Chest x-ray, which showed cardiomegaly with pulmonary edema pattern.  There is small left pleural effusion with associated consolidation, atelectasis versus pneumonia.  Repeat chest x-ray done on December 24, 2010, showed probable congestive heart failure.  There is increased lower lobe collapse, slight consolidation, pneumonia cannot be excluded and a CT of the abdomen and pelvis with contrast showed bilateral hydronephrosis to the level of the markedly distended bladder with partial bladder outlet obstruction.  A 2-D echo done on December 24, 2010, showed normal ventricular cavity, ejection fraction is 55% to 60%.  No regional wall motion abnormalities seen.  HOSPITAL COURSE:  The patient was admitted to Telemetry and put on heart failure protocol that include lisinopril 10 mg p.o. daily, furosemide 20 mg IV q.12 h., KCl 10 mEq p.o. daily, bisoprolol 5 mg p.o. daily, and digoxin 0.125 mg p.o. daily.  At this time, the patient was seen by me for the very first time in this admission, which was on December 23, 2010.  Examination showed rales and scattered rhonchi in the lungs and subsequently, the patient was put on albuterol and Atrovent nebs q.6 h. p.r.n.  He was also placed on Aldactone 25 mg p.o. b.i.d.  Also given to the patient was Lopressor 5 mg IV stat p.r.n. as well as Vasotec 1.25 mg IV stat for uncontrolled blood  pressure.  The patient was also placed on Ativan 0.5 mg p.o. p.r.n.  Also added to the patient's regimen was Synthroid 25 mcg p.o. daily, and Coreg 12.5 mg p.o. b.i.d.  The patient was also given BiDil 1 p.o. t.i.d. and Physical Therapy was consulted for gradual ambulation of the patient.  Because of the history of COPD and because the patient was wheezing, Levaquin 500 mg IV q.24 h. was added to the patient's regimen.  GI prophylaxis was done with Protonix 40 mg p.o. daily.  The patient was evaluated and followed by me on a daily basis.  On December 25, 2010, the patient was said to have complained about abdominal discomfort over the night and a CT of the abdomen and pelvis done with contrast showed bladder outlet obstruction and subsequently, the patient was catheterized and about 1200 to 1500 cc of urine was evacuated from the bladder.  Thereafter, the bladder catheter was left in situ.  Also added to the patient's regimen was Flomax 0.4 mg p.o. q.h.s. and finasteride 5 mg p.o. daily.  His IV Lasix was however, changed to Lasix 40  mg p.o. daily.  The patient continued to have irregular heartbeat and tachycardic and Cardizem 240 mg p.o. daily was added to regimen.  So far the patient has made remarkable progress.  Abdominal distention has resolved completely following bladder catheterization and the patient's clinical status has improved remarkably.  He was seen by me today.  He was evaluated by me and followed on a daily basis.  He was seen by me today, which is December 26, 2010.  The patient feels better.  He demanded to keep the bladder catheter because he does not want to experience the abdominal discomfort he did while he was on admission and I oblige.  Examination of the patient showed the patient not to be in any distress.  His vital signs, blood pressure is 105/62, pulse 85, respiratory rate is 24, temperature is 97.9, and medically stable.  Plan is for the patient to be  discharged home today and activity as tolerated.  Daily weight and fluid restriction.  He was also advised to watch out for signs and symptoms of congestive heart failure, which includes congestion in the lungs, shortness of breath, progressive swelling of the lower extremity, and should he develop any of these symptoms, should return to the hospital to be evaluated and since he is on home O2, he was advised to continue his O2 therapy.  Medications to be taken at home include the following, 1. Carvedilol 12.5 mg 1 p.o. b.i.d. with meals. 2. Diltiazem 240 mg p.o. daily. 3. Spironolactone 25 mg 1 p.o. b.i.d. 4. Avodart (dutasteride 0.5 mg) p.o. daily. 5. Celebrex 1 tablet p.o. b.i.d. p.r.n. for pain. 6. Claritin (loratadine) 10 mg 1 p.o. daily. 7. Combivent (ipratropium and albuterol) inhaler 2 puffs b.i.d. 8. Digoxin 0.125 mg p.o. daily. 9. DuoNeb (ipratropium and albuterol nebs) t.i.d. p.r.n. 10.Ensure chocolate liquid 1 bottle t.i.d. 11.Flora-Q (lactobacillus) 1 tablet p.o. daily. 12.Furosemide 20 mg p.o. daily. 13.Potassium chloride 10 mEq p.o. daily. 14.Levaquin 750 mg p.o. daily. 15.Lorazepam 0.5 mg half a tablet p.o. daily p.r.n. for anxiety. 16.Levothyroxine 75 mcg 1 p.o. daily. 17.MiraLax (polyethylene glycol 3350, 17 g 1 p.o. b.i.d. p.r.n. 18.Multivitamins 1 p.o. daily. 19.Nitroglycerin sublingual 0.5 mg q.5 minutes x3 doses p.r.n. for chest pain. 20.Plavix (clopidogrel) 75 mg 1 p.o. daily. 21.Prilosec (omeprazole) 20 mg 1 p.o. daily. 22.Tamsulosin 0.4 mg p.o. daily. 23.Tramadol 50 mg 1 p.o. q.6 h. p.r.n. for pain. 24.Thick-It food thickener 5 g p.o. daily p.r.n.  Discharge was cancelled because patient became hypotensive. He was given bolus doses of normal saline and started on stress doses of steroid,pressor,i.v vancomycin and rocephin. All patients anti hypertensive meds was held. Blood and urine culture was ordered. Patient was transfereed to step  down.      Talmage Nap, MD     CN/MEDQ  D:  12/26/2010  T:  12/26/2010  Job:  [id number]  cc:   Gordy Savers, MD [address] [place] [address]  Electronically Signed by Talmage Nap  on 12/26/2010 05:38:07 PM

## 2010-12-27 ENCOUNTER — Inpatient Hospital Stay (HOSPITAL_COMMUNITY): Payer: Medicare Other

## 2010-12-27 LAB — GLUCOSE, CAPILLARY

## 2010-12-27 LAB — CBC
HCT: 35.8 % — ABNORMAL LOW (ref 39.0–52.0)
MCH: 26.1 pg (ref 26.0–34.0)
MCHC: 30.7 g/dL (ref 30.0–36.0)
RDW: 14.3 % (ref 11.5–15.5)

## 2010-12-27 LAB — COMPREHENSIVE METABOLIC PANEL
Albumin: 2.6 g/dL — ABNORMAL LOW (ref 3.5–5.2)
Alkaline Phosphatase: 80 U/L (ref 39–117)
BUN: 50 mg/dL — ABNORMAL HIGH (ref 6–23)
Calcium: 9.1 mg/dL (ref 8.4–10.5)
GFR calc Af Amer: >60 mL/min (ref >60)
Glucose, Bld: 145 mg/dL — ABNORMAL HIGH (ref 70–99)
Potassium: 4.6 mEq/L (ref 3.5–5.1)
Sodium: 135 mEq/L (ref 135–145)
Total Protein: 6 g/dL (ref 6.0–8.3)

## 2010-12-27 LAB — CARDIAC PANEL(CRET KIN+CKTOT+MB+TROPI)
Relative Index: INVALID (ref 0.0–2.5)
Relative Index: INVALID (ref 0.0–2.5)
Total CK: 85 U/L (ref 7–232)
Troponin I: <0.3 ng/mL (ref <0.30)

## 2010-12-27 LAB — DIFFERENTIAL
Basophils Absolute: 0 10*3/uL (ref 0.0–0.1)
Basophils Relative: 0 % (ref 0–1)
Eosinophils Relative: 0 % (ref 0–5)
Monocytes Absolute: 0.1 10*3/uL (ref 0.1–1.0)
Monocytes Relative: 2 % — ABNORMAL LOW (ref 3–12)

## 2010-12-27 LAB — MAGNESIUM: Magnesium: 2.4 mg/dL (ref 1.5–2.5)

## 2010-12-28 LAB — COMPREHENSIVE METABOLIC PANEL WITH GFR
ALT: 23 U/L (ref 0–53)
AST: 16 U/L (ref 0–37)
Albumin: 2.4 g/dL — ABNORMAL LOW (ref 3.5–5.2)
Alkaline Phosphatase: 66 U/L (ref 39–117)
BUN: 38 mg/dL — ABNORMAL HIGH (ref 6–23)
CO2: 34 meq/L — ABNORMAL HIGH (ref 19–32)
Calcium: 9.3 mg/dL (ref 8.4–10.5)
Chloride: 102 meq/L (ref 96–112)
Creatinine, Ser: 0.71 mg/dL (ref 0.50–1.35)
GFR calc Af Amer: >60 mL/min
GFR calc non Af Amer: >60 mL/min
Glucose, Bld: 151 mg/dL — ABNORMAL HIGH (ref 70–99)
Potassium: 3.8 meq/L (ref 3.5–5.1)
Sodium: 140 meq/L (ref 135–145)
Total Bilirubin: 0.2 mg/dL — ABNORMAL LOW (ref 0.3–1.2)
Total Protein: 5.6 g/dL — ABNORMAL LOW (ref 6.0–8.3)

## 2010-12-28 LAB — URINE CULTURE
Colony Count: NO GROWTH
Culture  Setup Time: 201209220055
Culture: NO GROWTH
Special Requests: NEGATIVE

## 2010-12-28 LAB — CARDIAC PANEL(CRET KIN+CKTOT+MB+TROPI)
CK, MB: 2.9 ng/mL (ref 0.3–4.0)
Relative Index: INVALID (ref 0.0–2.5)
Total CK: 54 U/L (ref 7–232)
Troponin I: <0.3 ng/mL (ref <0.30)

## 2010-12-28 LAB — MAGNESIUM: Magnesium: 2.2 mg/dL (ref 1.5–2.5)

## 2010-12-29 ENCOUNTER — Inpatient Hospital Stay (HOSPITAL_COMMUNITY): Payer: Medicare Other

## 2010-12-29 LAB — CBC
HCT: 35.2 % — ABNORMAL LOW (ref 39.0–52.0)
MCHC: 31 g/dL (ref 30.0–36.0)
MCV: 86.5 fL (ref 78.0–100.0)
Platelets: 153 10*3/uL (ref 150–400)
RDW: 14.2 % (ref 11.5–15.5)

## 2010-12-29 LAB — COMPREHENSIVE METABOLIC PANEL
Albumin: 2.3 g/dL — ABNORMAL LOW (ref 3.5–5.2)
BUN: 36 mg/dL — ABNORMAL HIGH (ref 6–23)
Creatinine, Ser: 0.55 mg/dL (ref 0.50–1.35)
Total Protein: 5.3 g/dL — ABNORMAL LOW (ref 6.0–8.3)

## 2010-12-29 LAB — DIFFERENTIAL
Basophils Absolute: 0 10*3/uL (ref 0.0–0.1)
Eosinophils Absolute: 0 10*3/uL (ref 0.0–0.7)
Eosinophils Relative: 0 % (ref 0–5)
Lymphocytes Relative: 16 % (ref 12–46)
Lymphs Abs: 1 10*3/uL (ref 0.7–4.0)
Monocytes Absolute: 0.7 10*3/uL (ref 0.1–1.0)

## 2010-12-29 LAB — MAGNESIUM: Magnesium: 2.3 mg/dL (ref 1.5–2.5)

## 2010-12-30 ENCOUNTER — Other Ambulatory Visit: Payer: Self-pay | Admitting: Internal Medicine

## 2010-12-30 LAB — DIFFERENTIAL
Lymphocytes Relative: 23 % (ref 12–46)
Lymphs Abs: 1.3 10*3/uL (ref 0.7–4.0)
Neutro Abs: 3.5 10*3/uL (ref 1.7–7.7)
Neutrophils Relative %: 62 % (ref 43–77)

## 2010-12-30 LAB — COMPREHENSIVE METABOLIC PANEL
AST: 15 U/L (ref 0–37)
BUN: 27 mg/dL — ABNORMAL HIGH (ref 6–23)
CO2: 39 mEq/L — ABNORMAL HIGH (ref 19–32)
Chloride: 99 mEq/L (ref 96–112)
Creatinine, Ser: 0.56 mg/dL (ref 0.50–1.35)
GFR calc non Af Amer: >60 mL/min (ref >60)
Glucose, Bld: 93 mg/dL (ref 70–99)
Total Bilirubin: 0.2 mg/dL — ABNORMAL LOW (ref 0.3–1.2)

## 2010-12-30 LAB — CBC
HCT: 37.5 % — ABNORMAL LOW (ref 39.0–52.0)
Hemoglobin: 11.5 g/dL — ABNORMAL LOW (ref 13.0–17.0)
MCV: 87 fL (ref 78.0–100.0)
Platelets: 156 10*3/uL (ref 150–400)
RBC: 4.31 MIL/uL (ref 4.22–5.81)
WBC: 5.6 10*3/uL (ref 4.0–10.5)

## 2010-12-30 NOTE — Discharge Summary (Signed)
  [name], [name] NO.:  [id number]  MEDICAL RECORD NO.:  [id number]  LOCATION:  1438                         FACILITY:  [facility]  PHYSICIAN:  Talmage Nap, MD  DATE OF BIRTH:  1917/08/24  DATE OF ADMISSION:  12/23/2010 DATE OF DISCHARGE:  12/30/2010                        DISCHARGE SUMMARY - REFERRING   ADDENDUM  The patient was scheduled to be discharged on December 27, 2010, but discharge was cancelled because the patient became hypotensive.  He however denied any history of chest pain.  He denied any shortness of breath.  Examination of the patient at this time showed the patient to have systolic blood pressure less than 80 mmHg.  He was given boluses of normal saline and started on stress doses of steroids and also pressor. He was also started on IV vancomycin and Rocephin for questionable sepsis, and thereafter, transferred to a step-down unit.  In step-down unit, the patient's blood pressure was maintained with pressor and, however, his blood pressure got stabilized.  Steroid was discontinued as well as pressor.  He was also given breathing treatment as well. Following stabilization of the patient to step-down, he was transferred back to telemetry.  All lab work done did not show any sign of infection, and subsequently, vancomycin and Rocephin was discontinued. The patient was, however, restarted on medication for congestive heart failure, and these include, diuresing the patient with Lasix and also restarting Coreg.  So far, he had remained clinically stable.  He was followed and evaluated by me on a daily basis and made remarkable progress.  The patient was seen by me today which is December 30, 2010, clinically stable.  Denies any complaint.  PHYSICAL EXAMINATION:  VITAL SIGNS:  Blood pressure is 158/89, pulse is 60, respiratory 18, and temperature is 97.5. LUNGS:  Showed minimal bibasilar crackles.  LABORATORY DATA PRIOR TO DISCHARGE:   Include, comprehensive metabolic panel which showed sodium of 141, potassium of 3.7, chloride of 99 with a bicarb of 39, BUN is 27, creatinine is 0.52 with a glucose level of 93.  Hematological indices showed WBC of 5.6, hemoglobin of 11.5, hematocrit 37.5, with a platelet count of 156.  Chest x-ray done on December 29, 2010, showed cardiomegaly.  There was no pulmonary vascular congestion.  The patient was, however, continued on diuresis with Lasix.  So far, the patient has remained clinically stable.  PLAN:  Plan is for the patient to be discharged home today on activity as tolerated, daily weight, and fluid restriction; was advised to watch out for signs and symptoms of congestive heart failure which include shortness of breath, congestion in the lungs, and should he develop any of these symptoms, should be brought back to the emergency room to the hospital.  It is also important to mention that the patient has swallow evaluation done which showed dysphagia and he was started on dysphagia III diet.  Discharge diagnosis as well as discharge meds, please refer to my discharge summary dictated on December 26, 2010.     Talmage Nap, MD     CN/MEDQ  D:  12/30/2010  T:  12/30/2010  Job:  [id number]  Electronically Signed by Talmage Nap  on 12/30/2010 04:48:40 PM

## 2011-01-02 ENCOUNTER — Telehealth: Payer: Self-pay | Admitting: Internal Medicine

## 2011-01-02 LAB — CULTURE, BLOOD (ROUTINE X 2)
Culture  Setup Time: 201209220049
Culture  Setup Time: 201209220050
Culture: NO GROWTH

## 2011-01-02 MED ORDER — DIGOXIN 125 MCG PO TABS: 125.0000 ug | ORAL_TABLET | Freq: Every day | ORAL | Status: DC

## 2011-01-02 NOTE — Telephone Encounter (Signed)
Spoke with pt's caregiver. She wanted MW to know that pt was recently d/c'ed from [facility] and was told to d/c bystolic and start back on coreg. So far he seems to be doing well. She states that wanted MW to know since last ov he recommended the opposite. MW, pls advise, thanks! Note that pt has already had med cal done and has planned ov with you on 01/13/11.

## 2011-01-02 NOTE — Telephone Encounter (Signed)
Spoke with caregiver - informed that we have never recv'd request fro med - will send refill over now to [facility].

## 2011-01-02 NOTE — Telephone Encounter (Signed)
LMTCB

## 2011-01-02 NOTE — Telephone Encounter (Signed)
Ok to substitute coreg for bystolic if doing well with breathing but taking a risk it will exacerbate airways problems and if breathing or coughing get worse Strongly prefer in this setting: Bystolic, the most beta -1  selective Beta blocker available in sample form, with bisoprolol the most selective generic choice  on the market.    Suggest he place coreg on the same line as bystolic so everyone recognizes they are used the same way and he doesn't get confused and be listed as taking both.  Needs f/u with me w/in 2 weeks with calendar and all meds in hand

## 2011-01-02 NOTE — Telephone Encounter (Signed)
Pt caregiver contacted regarding digoxin (LANOXIN) 0.125 MG tablet wants to know why rx was denied. Please contact.

## 2011-01-05 ENCOUNTER — Telehealth: Payer: Self-pay | Admitting: Internal Medicine

## 2011-01-05 NOTE — Telephone Encounter (Signed)
Pt caregiver advised. They have an appt on 01-13-11.  Carron Curie, CMA

## 2011-01-05 NOTE — Telephone Encounter (Signed)
duplicate message. Micheala Morissette, CMA  

## 2011-01-08 ENCOUNTER — Telehealth: Payer: Self-pay | Admitting: *Deleted

## 2011-01-08 NOTE — Telephone Encounter (Signed)
Pt is concerned about his foley catheter (thinks it may be too long?).  Who should he talk to about this problem?

## 2011-01-08 NOTE — Telephone Encounter (Signed)
Needs a post hospital discharge followup here  Patient was probably given discharge instructions to followup with urology when he left the hospital

## 2011-01-08 NOTE — Telephone Encounter (Signed)
Pt has appt with Dr Kirtland Bouchard for hosp discharge follow up.  He will call Urology to ask about catheter.

## 2011-01-13 ENCOUNTER — Ambulatory Visit: Payer: Self-pay | Admitting: Internal Medicine

## 2011-01-14 ENCOUNTER — Other Ambulatory Visit: Payer: Self-pay | Admitting: Internal Medicine

## 2011-01-15 DIAGNOSIS — R1310 Dysphagia, unspecified: Secondary | ICD-10-CM

## 2011-01-15 DIAGNOSIS — R339 Retention of urine, unspecified: Secondary | ICD-10-CM

## 2011-01-15 DIAGNOSIS — I509 Heart failure, unspecified: Secondary | ICD-10-CM

## 2011-01-15 DIAGNOSIS — Z466 Encounter for fitting and adjustment of urinary device: Secondary | ICD-10-CM

## 2011-01-16 ENCOUNTER — Ambulatory Visit (INDEPENDENT_AMBULATORY_CARE_PROVIDER_SITE_OTHER): Payer: Medicare Other | Admitting: Internal Medicine

## 2011-01-16 ENCOUNTER — Encounter: Payer: Self-pay | Admitting: Internal Medicine

## 2011-01-16 VITALS — BP 110/80 | Temp 97.9°F

## 2011-01-16 DIAGNOSIS — Z23 Encounter for immunization: Secondary | ICD-10-CM

## 2011-01-16 DIAGNOSIS — I10 Essential (primary) hypertension: Secondary | ICD-10-CM

## 2011-01-16 DIAGNOSIS — I5022 Chronic systolic (congestive) heart failure: Secondary | ICD-10-CM

## 2011-01-16 DIAGNOSIS — J441 Chronic obstructive pulmonary disease with (acute) exacerbation: Secondary | ICD-10-CM

## 2011-01-16 DIAGNOSIS — Z0000 Encounter for general adult medical examination without abnormal findings: Secondary | ICD-10-CM

## 2011-01-16 DIAGNOSIS — I4891 Unspecified atrial fibrillation: Secondary | ICD-10-CM

## 2011-01-16 DIAGNOSIS — I2510 Atherosclerotic heart disease of native coronary artery without angina pectoris: Secondary | ICD-10-CM

## 2011-01-16 DIAGNOSIS — R339 Retention of urine, unspecified: Secondary | ICD-10-CM

## 2011-01-16 MED ORDER — DILTIAZEM HCL ER COATED BEADS 240 MG PO CP24: 240.0000 mg | ORAL_CAPSULE | Freq: Every day | ORAL | Status: DC

## 2011-01-16 MED ORDER — CARVEDILOL 12.5 MG PO TABS: 12.5000 mg | ORAL_TABLET | Freq: Two times a day (BID) | ORAL | Status: DC

## 2011-01-16 MED ORDER — SPIRONOLACTONE 25 MG PO TABS: 25.0000 mg | ORAL_TABLET | Freq: Two times a day (BID) | ORAL | Status: DC

## 2011-01-16 NOTE — Progress Notes (Signed)
  Subjective:    Patient ID: [name], male    DOB: 24-May-1917, 77 y.o.   MRN: [id number]  HPI 77 year old patient who is seen following hospital discharge. He was admitted with decompensated systolic heart failure. He presented with worsening and progressive dyspnea. Hospital records were reviewed. He also has a history of chronic atrial fibrillation and advanced COPD. He is on chronic home O2. Since his discharge he has done fairly well. There has been no peripheral edema weight gain and his breathing and pulmonary status had been stable. He has completed antibiotic therapy and his diuretic medications were adjusted and he has been able tolerate a higher dose of Coreg    Review of Systems  Constitutional: Positive for fatigue. Negative for fever, chills and appetite change.  HENT: Negative for hearing loss, ear pain, congestion, sore throat, trouble swallowing, neck stiffness, dental problem, voice change and tinnitus.   Eyes: Negative for pain, discharge and visual disturbance.  Respiratory: Positive for shortness of breath. Negative for cough, chest tightness, wheezing and stridor.   Cardiovascular: Negative for chest pain, palpitations and leg swelling.  Gastrointestinal: Negative for nausea, vomiting, abdominal pain, diarrhea, constipation, blood in stool and abdominal distention.  Genitourinary: Negative for urgency, hematuria, flank pain, discharge, difficulty urinating and genital sores.  Musculoskeletal: Negative for myalgias, back pain, joint swelling, arthralgias and gait problem.  Skin: Negative for rash.  Neurological: Negative for dizziness, syncope, speech difficulty, weakness, numbness and headaches.  Hematological: Negative for adenopathy. Does not bruise/bleed easily.  Psychiatric/Behavioral: Negative for behavioral problems and dysphoric mood. The patient is not nervous/anxious.        Objective:   Physical Exam  Constitutional: He is oriented to person,  place, and time. He appears well-developed.       Elderly frail no acute distress nasal oxygen in place wheelchair-bound  HENT:  Head: Normocephalic.  Right Ear: External ear normal.  Left Ear: External ear normal.  Eyes: Conjunctivae and EOM are normal.  Neck: Normal range of motion.  Cardiovascular: Normal rate and normal heart sounds.        Controlled ventricular response  Pulmonary/Chest: Effort normal and breath sounds normal.       Diminished breath sounds at the bases but clear  Abdominal: Bowel sounds are normal.  Musculoskeletal: Normal range of motion. He exhibits no edema and no tenderness.       No lower extremity edema  Neurological: He is alert and oriented to person, place, and time.  Psychiatric: He has a normal mood and affect. His behavior is normal.          Assessment & Plan:   Chronic systolic heart failure. Compensated at present Chronic hypoxic with arterial insufficiency. We'll continue chronic oxygen therapy Chronic atrial fibrillation Hypertension  We'll continue his present regimen he seems to be tolerating a higher dose of Coreg as well as his diuretic therapy no signs of fluid overload. Urology followup as scheduled

## 2011-01-16 NOTE — Patient Instructions (Signed)
Limit your sodium (Salt) intake  Return office visit 2 months  

## 2011-01-20 NOTE — H&P (Signed)
NAMEMarland Kitchen  [name], [name] NO.:  [id number]  MEDICAL RECORD NO.:  [id number]  LOCATION:  WLED                         FACILITY:  [facility]  PHYSICIAN:  Houston Siren, MD           DATE OF BIRTH:  1917/11/25  DATE OF ADMISSION:  12/23/2010 DATE OF DISCHARGE:                             HISTORY & PHYSICAL   PRIMARY CARE PHYSICIAN:  Gordy Savers, MD  CARDIOLOGIST:  [facility].  REASON FOR ADMISSION:  Congestive heart failure.  ADVANCE DIRECTIVE:  Full code.  HISTORY OF PRESENT ILLNESS:  This is a 77 year old male with history of COPD on home O2, hypothyroidism, history of atrial fibrillation, coronary artery disease, congestive heart failure, prior CVA, recent incarcerated hernia repair, who lives with his wife along with a caretaker, presents to the emergency room with 2-week history of progressive shortness of breath and orthopnea.  He denied any fever, chills, or any cough.  There has been no leg swelling and no calf tenderness.  He was evaluated in the emergency room and chest x-ray showed cardiomegaly with pulmonary edema.  His BNP is elevated at 5000. His creatinine is 0.54 and troponin less than 0.3.  He has a normal white count of 6500 and hemoglobin of 11.1.  Hospitalist was asked to admit the patient because of congestive heart failure.  PAST MEDICAL HISTORY: 1. History of incarcerated hernia repair in May 2012. 2. COPD, require intubation. 3. Chronic atrial fibrillation, currently not on Coumadin. 4. Hypothyroidism. 5. GERD. 6. Peripheral vascular disease. 7. Prior CVA. 8. BPH. 9. DJD. 10.History of peptic ulcer. 11.History of spinal stenosis.  ALLERGIES:  NOVOCAINE.  PAST SURGICAL HISTORY:  Status post AAA repair and hernia repair with wire mesh.  MEDICATIONS: 1. Ativan 0.5 mg b.i.d. 2. Flomax 0.4 mg per day. 3. Prilosec 20 mg per day. 4. Claritin 10 mg per day. 5. Ultram 50 mg b.i.d. 6. Celebrex 200 mg once a day. 7. KCL 10  mEq per day. 8. Plavix. 9. Bystolic 5 mg per day. 10.Synthroid 75 mcg per day. 11.Lasix as 20 mg per day. 12.Digoxin 125 mcg per day. 13.Avodart 0.5 mg once a day.  REVIEW OF SYSTEMS:  Otherwise unremarkable.  FAMILY HISTORY:  Significant for father died of TB.  Mother had heart attack.  Two sisters with dementia and breast cancer.  PHYSICAL EXAMINATION:  VITAL SIGNS:  Blood pressure 160/70, repeat 150/70; pulse of 80; respiratory rate 16; temperature 98.8. GENERAL:  He does not appear to be in any respiratory distress.  He has no tachypnea or dyspnea. HEENT:  Sclerae are nonicteric.  Pupils equal, round, and reactive.  He is alert and oriented and conversing with fluent speech. NECK:  Supple.  Slight JVD on the right side.  Throat is clear.  Tongue is midline.  He has no stridor. CARDIAC:  Exam revealed S1 and S2 irregularly irregular.  No gallop. LUNGS:  With decreased breath sounds, bilateral crackles at both bases. ABDOMEN:  Soft, nondistended, and nontender.  No rebound. EXTREMITIES:  Show no edema.  No calf tenderness. NEUROLOGIC:  Exam is nonfocal.  He has good strength bilaterally. SKIN:  Warm and dry.  LABORATORY STUDIES:  BNP 5057.  Chest x-ray show cardiomegaly with pulmonary edema.  Small left pleural effusion, atelectasis versus pneumonia.  Troponin is less than 0.3.  Serum sodium 134, potassium 4.3, CO2 of 30, glucose of 110, creatinine 0.54.  White count 6500, hemoglobin 11.1, MCV of 84, and platelet count 152,000.  EKG showed atrial fibrillation.  No acute ST-T changes.  IMPRESSION:  This is a 77 year old male with history of chronic obstructive pulmonary disease, on home O2, presents with congestive heart failure.  His last echo in 2010 showed ejection fraction of 60%. We will repeat his echo.  We will admit him and diurese him gently with intravenous Lasix.  Since his medications are reconciled, we will continue them as well.  He is on digitalis, and we will  check a level.  We will discontinue his Celebrex.  Continue his Plavix.  His other medications can be continued.  I do not think he has pneumonia, and thus antibiotic was not given.  He is a full code.  We will admit him to [facility].  He is stable.     Houston Siren, MD    PL/MEDQ  D:  12/23/2010  T:  12/23/2010  Job:  [id number]  Electronically Signed by Houston Siren  on 01/20/2011 04:03:18 AM

## 2011-01-22 ENCOUNTER — Ambulatory Visit: Payer: Self-pay | Admitting: Internal Medicine

## 2011-01-22 ENCOUNTER — Ambulatory Visit (INDEPENDENT_AMBULATORY_CARE_PROVIDER_SITE_OTHER): Payer: Medicare Other | Admitting: Internal Medicine

## 2011-01-22 ENCOUNTER — Encounter: Payer: Self-pay | Admitting: Internal Medicine

## 2011-01-22 DIAGNOSIS — R110 Nausea: Secondary | ICD-10-CM

## 2011-01-22 DIAGNOSIS — I10 Essential (primary) hypertension: Secondary | ICD-10-CM

## 2011-01-22 DIAGNOSIS — K279 Peptic ulcer, site unspecified, unspecified as acute or chronic, without hemorrhage or perforation: Secondary | ICD-10-CM

## 2011-01-22 DIAGNOSIS — R109 Unspecified abdominal pain: Secondary | ICD-10-CM

## 2011-01-22 NOTE — Progress Notes (Signed)
  Subjective:    Patient ID: [name], male    DOB: May 24, 1917, 77 y.o.   MRN: [id number]  HPI 77 year old patient who is seen today for followup for the past 2 days he has had some nausea throughout the day today has had some epigastric discomfort. He has nausea but no vomiting. His last bowel movement was 2 days ago which he describes as fairly normal. No fever. He has chronic systolic heart failure but denies any pulmonary complaints. Medical regimen does include diuretic therapy. He has been on chronic omeprazole   Review of Systems  Constitutional: Negative for fever, chills, appetite change and fatigue.  HENT: Negative for hearing loss, ear pain, congestion, sore throat, trouble swallowing, neck stiffness, dental problem, voice change and tinnitus.   Eyes: Negative for pain, discharge and visual disturbance.  Respiratory: Negative for cough, chest tightness, wheezing and stridor.   Cardiovascular: Negative for chest pain, palpitations and leg swelling.  Gastrointestinal: Positive for nausea and abdominal pain. Negative for vomiting, diarrhea, constipation, blood in stool and abdominal distention.  Genitourinary: Negative for urgency, hematuria, flank pain, discharge, difficulty urinating and genital sores.  Musculoskeletal: Negative for myalgias, back pain, joint swelling, arthralgias and gait problem.  Skin: Negative for rash.  Neurological: Negative for dizziness, syncope, speech difficulty, weakness, numbness and headaches.  Hematological: Negative for adenopathy. Does not bruise/bleed easily.  Psychiatric/Behavioral: Negative for behavioral problems and dysphoric mood. The patient is not nervous/anxious.        Objective:   Physical Exam  Constitutional: He is oriented to person, place, and time. He appears well-developed and well-nourished.       Frail,  appears unwell but in no acute distress  HENT:  Head: Normocephalic.  Right Ear: External ear normal.  Left  Ear: External ear normal.  Eyes: Conjunctivae and EOM are normal.  Neck: Normal range of motion.  Cardiovascular: Normal rate and normal heart sounds.   Pulmonary/Chest: Breath sounds normal.  Abdominal: Soft. Bowel sounds are normal. There is tenderness.       Mild tenderness in the epigastric and left upper quadrant. Bowel sounds normal no guarding or rebound  Musculoskeletal: Normal range of motion. He exhibits no edema and no tenderness.  Neurological: He is alert and oriented to person, place, and time.  Psychiatric: He has a normal mood and affect. His behavior is normal.          Assessment & Plan:   Abdominal pain nausea. Will place on Dexilant 60 mg daily for 2 weeks. Constipation issues will be done with Chronic systolic heart failure. Appears fairly well compensated Hypertension stable

## 2011-01-22 NOTE — Patient Instructions (Signed)
Avoids foods high in acid such as tomatoes citrus juices, and spicy foods.  Avoid eating within two hours of lying down or before exercising.  Do not overheat.  Try smaller more frequent meals.  If symptoms persist, elevate the head of her bed 12 inches while sleeping.  DEXILANT  1 daily  Use MiraLax once or twice daily

## 2011-01-23 ENCOUNTER — Encounter: Payer: Self-pay | Admitting: Internal Medicine

## 2011-01-23 ENCOUNTER — Telehealth: Payer: Self-pay | Admitting: *Deleted

## 2011-01-23 ENCOUNTER — Ambulatory Visit (INDEPENDENT_AMBULATORY_CARE_PROVIDER_SITE_OTHER): Payer: Medicare Other | Admitting: Internal Medicine

## 2011-01-23 DIAGNOSIS — J449 Chronic obstructive pulmonary disease, unspecified: Secondary | ICD-10-CM

## 2011-01-23 NOTE — Progress Notes (Signed)
  Subjective:    Patient ID: [name], male    DOB: [birth year], 77 y.o.   MRN: [id number]  HPI  54 yowm quit smoking 1945 with onset breathing difficulty around 2010 then bad to worse starting August 2012 so referred by K to pulmonary clinic.  12/02/2010 Initial pulmonary office eval cc doe x sev years much worse x sev weeks on 02 24 hours per day 2-4lpm assoc with minimal mucus production and sense of choking when swallowing and uses thickeners which help.  Assoc with nasal congestion, sense of pnds but not purulent sputum. Sleeping ok without nocturnal  or early am exac of resp c/o's or need for noct saba.  Apparently using duoneb as a maint, not sure it's helping.  Sob > room to room at present even on 4lpm >>Rx Prilosec daily and changed coreg to bystolic   12/16/2010 Follow up and med review Pt returns with family for follow up and med review. Last ov he was changed from coreg to bystolic. Rx PPI daily . Labs were essentially unrevealing w/ minimally elevated BNP ~200, ESR nml . TSH was nml.  He has brought all his meds today which we reviewed and organized into a med calendar with pt education Since last visit he does feel slightly better with less dyspnea.  rec Continue on Bystolic 5 mg one daily  Follow med calendar closely and bring to each visit  01/23/2011 f/u ov/Loyce Flaming cc improved doe room to room on 2lpm.  hfa poor,  Sleeping poorly due to shakes no noct breathing difficulty or am cough and congestion - not using med calendar, on two different neb solutions containing ipatropium, not using symbicort correctly. No purulent sputum.  ROS  At present neg for  any significant sore throat, dysphagia, itching, sneezing,  nasal congestion or excess/ purulent nasal secretions,  fever, chills, sweats, unintended wt loss, pleuritic or exertional cp, hempoptysis, orthopnea pnd or leg swelling.  Also denies presyncope, palpitations, heartburn, abdominal pain, nausea, vomiting, diarrhea   or change in bowel or urinary habits, dysuria,hematuria,  rash, arthralgias, visual complaints, headache, numbness weakness or ataxia.            Objective:   Physical Exam Frail elderly wm nad 12/02/2010  Wt  120>> 01/23/2011   111 HEENT mild turbinate edema.  Oropharynx no thrush or excess pnd or cobblestoning.  No JVD or cervical adenopathy. Mild accessory muscle hypertrophy. Trachea midline, nl thryroid. Chest was hyperinflated by percussion with diminished breath sounds and moderate increased exp time without wheeze. Hoover sign positive at mid inspiration. Regular rate and rhythm without murmur gallop or rub or increase P2 or edema.  Abd: no hsm, nl excursion. Ext warm without cyanosis or clubbing.      p CXR   12/29/10 Cardiomegaly and worsening of pulmonary vascular congestion     Assessment & Plan:

## 2011-01-23 NOTE — Telephone Encounter (Signed)
Pt wanted Dr. Kirtland Bouchard to know Dr. Sherene Sires stopped his Coreg.

## 2011-01-23 NOTE — Patient Instructions (Signed)
Work on inhaler technique:  relax and gently blow all the way out then take a nice smooth deep breath back in, triggering the inhaler at same time you start breathing in.  Hold for up to 5 seconds if you can.  Rinse and gargle with water when done   If your mouth or throat starts to bother you,   I suggest you time the inhaler to your dental care and after using the inhaler(s) brush teeth and tongue with a baking soda containing toothpaste and when you rinse this out, gargle with it first to see if this helps your mouth and throat.     Only use your albuterol  as a rescue medication to be used if you can't catch your breath by resting or doing a relaxed purse lip breathing pattern. The less you use it, the better it will work when you need it.   If not doing well with your breathing, See Tammy NPwith all your medications, even over the counter meds, separated in two separate bags, the ones you take no matter what vs the ones you stop once you feel better and take only as needed when you feel you need them.   Tammy  will generate for you a new user friendly medication calendar that will put [place] all on the same page re: your medication use. She will set you up on a different nebulizer regimen  Coreg may be partially interfering with the benefit of the inhalers and need to be changed back to bisoprolol or bystolic but I will defer this to Dr Kirtland Bouchard

## 2011-01-25 ENCOUNTER — Emergency Department (HOSPITAL_COMMUNITY): Payer: Medicare Other

## 2011-01-25 ENCOUNTER — Emergency Department (HOSPITAL_COMMUNITY)
Admission: EM | Admit: 2011-01-25 | Discharge: 2011-01-25 | Disposition: A | Discharge status: Home / Self Care | Payer: Medicare Other | Attending: Emergency Medicine | Admitting: Emergency Medicine

## 2011-01-25 ENCOUNTER — Encounter: Payer: Self-pay | Admitting: Internal Medicine

## 2011-01-25 DIAGNOSIS — I2510 Atherosclerotic heart disease of native coronary artery without angina pectoris: Secondary | ICD-10-CM | POA: Insufficient documentation

## 2011-01-25 DIAGNOSIS — R339 Retention of urine, unspecified: Secondary | ICD-10-CM | POA: Insufficient documentation

## 2011-01-25 DIAGNOSIS — Z8673 Personal history of transient ischemic attack (TIA), and cerebral infarction without residual deficits: Secondary | ICD-10-CM | POA: Insufficient documentation

## 2011-01-25 DIAGNOSIS — Z79899 Other long term (current) drug therapy: Secondary | ICD-10-CM | POA: Insufficient documentation

## 2011-01-25 DIAGNOSIS — I10 Essential (primary) hypertension: Secondary | ICD-10-CM | POA: Insufficient documentation

## 2011-01-25 DIAGNOSIS — J4489 Other specified chronic obstructive pulmonary disease: Secondary | ICD-10-CM | POA: Insufficient documentation

## 2011-01-25 DIAGNOSIS — I4891 Unspecified atrial fibrillation: Secondary | ICD-10-CM | POA: Insufficient documentation

## 2011-01-25 DIAGNOSIS — R109 Unspecified abdominal pain: Secondary | ICD-10-CM | POA: Insufficient documentation

## 2011-01-25 DIAGNOSIS — E039 Hypothyroidism, unspecified: Secondary | ICD-10-CM | POA: Insufficient documentation

## 2011-01-25 DIAGNOSIS — J449 Chronic obstructive pulmonary disease, unspecified: Secondary | ICD-10-CM | POA: Insufficient documentation

## 2011-01-25 DIAGNOSIS — R112 Nausea with vomiting, unspecified: Secondary | ICD-10-CM | POA: Insufficient documentation

## 2011-01-25 DIAGNOSIS — I509 Heart failure, unspecified: Secondary | ICD-10-CM | POA: Insufficient documentation

## 2011-01-25 DIAGNOSIS — M1990 Unspecified osteoarthritis, unspecified site: Secondary | ICD-10-CM | POA: Insufficient documentation

## 2011-01-25 LAB — DIFFERENTIAL
Basophils Relative: 0 % (ref 0–1)
Lymphs Abs: 2.3 10*3/uL (ref 0.7–4.0)
Monocytes Relative: 8 % (ref 3–12)
Neutro Abs: 4.7 10*3/uL (ref 1.7–7.7)
Neutrophils Relative %: 61 % (ref 43–77)

## 2011-01-25 LAB — POCT I-STAT, CHEM 8
Chloride: 96 mEq/L (ref 96–112)
Glucose, Bld: 144 mg/dL — ABNORMAL HIGH (ref 70–99)
HCT: 38 % — ABNORMAL LOW (ref 39.0–52.0)
Hemoglobin: 12.9 g/dL — ABNORMAL LOW (ref 13.0–17.0)
Potassium: 5 mEq/L (ref 3.5–5.1)
Sodium: 128 mEq/L — ABNORMAL LOW (ref 135–145)

## 2011-01-25 LAB — URINALYSIS, ROUTINE W REFLEX MICROSCOPIC
Glucose, UA: NEGATIVE mg/dL
Protein, ur: NEGATIVE mg/dL
pH: 7.5 (ref 5.0–8.0)

## 2011-01-25 LAB — BASIC METABOLIC PANEL
CO2: 29 mEq/L (ref 19–32)
Calcium: 9.7 mg/dL (ref 8.4–10.5)
Chloride: 91 mEq/L — ABNORMAL LOW (ref 96–112)
Glucose, Bld: 113 mg/dL — ABNORMAL HIGH (ref 70–99)
Sodium: 127 mEq/L — ABNORMAL LOW (ref 135–145)

## 2011-01-25 LAB — OCCULT BLOOD, POC DEVICE: Fecal Occult Bld: NEGATIVE

## 2011-01-25 LAB — URINE MICROSCOPIC-ADD ON

## 2011-01-25 LAB — CBC
Hemoglobin: 12.4 g/dL — ABNORMAL LOW (ref 13.0–17.0)
MCH: 26.5 pg (ref 26.0–34.0)
MCHC: 32.7 g/dL (ref 30.0–36.0)
MCV: 81 fL (ref 78.0–100.0)
RBC: 4.68 MIL/uL (ref 4.22–5.81)
WBC: 7.8 10*3/uL (ref 4.0–10.5)

## 2011-01-25 MED ORDER — IOHEXOL 300 MG/ML  SOLN
100.0000 mL | Freq: Once | INTRAMUSCULAR | Status: AC | PRN
  Administered 2011-01-25: 100 mL via INTRAVENOUS

## 2011-01-25 NOTE — Assessment & Plan Note (Addendum)
Clinically he has severe copd/ 02 dep and chronically hypercarbic with HCO3 levels high 30's consistently and high likelihood for exacerbation but has improved on present rx, albeit still struggling with concept of med reconciliation and administration  I had an extended discussion with the patient and fm today lasting 15 to 20 minutes of a 25 minute visit on the following issues:   If worsening, the first step is to clarify his self rx.   To keep things simple, I have asked the patient to first separate medicines thty toat are perceived as maintenance, that is to be taken daily "no matter what", from those medicines that are taken on only on an as-needed basis and I have given the patient examples of both, and then return to see our NP to generate a  detailed  medication calendar which should be followed until the next physician sees the patient and updates it.  It is not possible to provide quality medical care from this clinic in this complicated of a patient s this process,  Specific concerns here are  A) inability to use hfa consistently.  The proper method of use, as well as anticipated side effects, of this metered-dose inhaler are discussed and demonstrated to the patient. Improved only to around 50% with repeated coaching - if not improving consider formoterol and budesonide  B) Use of multiple anticholinergics. Prefer spriva alone if he can tolerate it, otherwise atrovent qid and albuterol only as needed.  C) Use of non-specific B Blockers.  Strongly prefer in this setting: Bystolic, the most beta -1  selective Beta blocker available in sample form, with bisoprolol the most selective generic choice  on the market.   D) End of life issues will need to be addressed sooner rather than later but I'll defer this for now to Dr Lesia Hausen

## 2011-01-26 ENCOUNTER — Other Ambulatory Visit: Payer: Self-pay | Admitting: Internal Medicine

## 2011-01-26 NOTE — Telephone Encounter (Signed)
Please advise 

## 2011-01-26 NOTE — Telephone Encounter (Signed)
Attempt to call - ans mach - take med qd instead of bid - will discuss change in therapy at next ov. KIK

## 2011-01-26 NOTE — Telephone Encounter (Signed)
Pt called again about previous message and wants to know if Dr. Kirtland Bouchard wants him off the Coreg. Please contact pt

## 2011-01-26 NOTE — Telephone Encounter (Signed)
Ask  patient to decrease his Coreg dose by one half twice daily and we will consider a change in regimen at his next OV

## 2011-01-27 ENCOUNTER — Telehealth: Payer: Self-pay | Admitting: Internal Medicine

## 2011-01-27 NOTE — Telephone Encounter (Signed)
Wants [name] to call him about his meds. He is confused about what the hospital gave him and what he is supposed to be taking here at our office.

## 2011-01-27 NOTE — Telephone Encounter (Signed)
Spoke with pt- ok to increase omeprazole to bid to see if it helps with nausea. KIK

## 2011-01-29 ENCOUNTER — Telehealth: Payer: Self-pay

## 2011-01-29 MED ORDER — ONDANSETRON HCL 4 MG PO TABS: 4.0000 mg | ORAL_TABLET | Freq: Four times a day (QID) | ORAL | Status: DC

## 2011-01-29 NOTE — Telephone Encounter (Signed)
Do not take potassium until further notice

## 2011-01-29 NOTE — Telephone Encounter (Signed)
Generic zophran 4 mg  #30 one every 6 hrs as needed

## 2011-01-29 NOTE — Telephone Encounter (Signed)
Pt aware.

## 2011-01-29 NOTE — Telephone Encounter (Signed)
rx sent in to pharmacy.  Spoke with Benita and she states that pt went to the ER and was told that his potassium was too high and that his medication should be held.  Pt has been holding potassium since Sunday. How long should pt hold potassium?

## 2011-01-29 NOTE — Telephone Encounter (Signed)
Pt request nausea medication because he is still nauseated.  Pls advise.

## 2011-02-12 ENCOUNTER — Encounter: Payer: Self-pay | Admitting: Internal Medicine

## 2011-02-12 ENCOUNTER — Ambulatory Visit (INDEPENDENT_AMBULATORY_CARE_PROVIDER_SITE_OTHER): Payer: Medicare Other | Admitting: Internal Medicine

## 2011-02-12 DIAGNOSIS — I2510 Atherosclerotic heart disease of native coronary artery without angina pectoris: Secondary | ICD-10-CM

## 2011-02-12 DIAGNOSIS — I10 Essential (primary) hypertension: Secondary | ICD-10-CM

## 2011-02-12 DIAGNOSIS — I5022 Chronic systolic (congestive) heart failure: Secondary | ICD-10-CM

## 2011-02-12 DIAGNOSIS — E875 Hyperkalemia: Secondary | ICD-10-CM

## 2011-02-12 DIAGNOSIS — E871 Hypo-osmolality and hyponatremia: Secondary | ICD-10-CM

## 2011-02-12 DIAGNOSIS — I4891 Unspecified atrial fibrillation: Secondary | ICD-10-CM

## 2011-02-12 LAB — BASIC METABOLIC PANEL
CO2: 33 mEq/L — ABNORMAL HIGH (ref 19–32)
Chloride: 99 mEq/L (ref 96–112)
Creatinine, Ser: 0.7 mg/dL (ref 0.4–1.5)
Potassium: 5.1 mEq/L (ref 3.5–5.1)
Sodium: 137 mEq/L (ref 135–145)

## 2011-02-12 NOTE — Patient Instructions (Addendum)
Limit your sodium (Salt) intake  Return in one month for follow-up Constipation in Adults Constipation is having fewer than 2 bowel movements per week. Usually, the stools are hard. As we grow older, constipation is more common. If you try to fix constipation with laxatives, the problem may get worse. This is because laxatives taken over a long period of time make the colon muscles weaker. A low-fiber diet, not taking in enough fluids, and taking some medicines may make these problems worse. MEDICATIONS THAT MAY CAUSE CONSTIPATION  Water pills (diuretics).     Calcium channel blockers (used to control blood pressure and for the heart).     Certain pain medicines (narcotics).     Anticholinergics.    Anti-inflammatory agents.     Antacids that contain aluminum.  DISEASES THAT CONTRIBUTE TO CONSTIPATION  Diabetes.     Parkinson's disease.     Dementia.    Stroke.    Depression.    Illnesses that cause problems with salt and water metabolism.  HOME CARE INSTRUCTIONS    Constipation is usually best cared for without medicines. Increasing dietary fiber and eating more fruits and vegetables is the best way to manage constipation.     Slowly increase fiber intake to 25 to 38 grams per day. Whole grains, fruits, vegetables, and legumes are good sources of fiber. A dietitian can further help you incorporate high-fiber foods into your diet.     Drink enough water and fluids to keep your urine clear or pale yellow.     A fiber supplement may be added to your diet if you cannot get enough fiber from foods.     Increasing your activities also helps improve regularity.     Suppositories, as suggested by your caregiver, will also help. If you are using antacids, such as aluminum or calcium containing products, it will be helpful to switch to products containing magnesium if your caregiver says it is okay.     If you have been given a liquid injection (enema) today, this is only a  temporary measure. It should not be relied on for treatment of longstanding (chronic) constipation.     Stronger measures, such as magnesium sulfate, should be avoided if possible. This may cause uncontrollable diarrhea. Using magnesium sulfate may not allow you time to make it to the bathroom.  SEEK IMMEDIATE MEDICAL CARE IF:    There is bright red blood in the stool.     The constipation stays for more than 4 days.     There is belly (abdominal) or rectal pain.     You do not seem to be getting better.     You have any questions or concerns.  MAKE SURE YOU:    Understand these instructions.     Will watch your condition.     Will get help right away if you are not doing well or get worse.  Document Released: 12/20/2003 Document Revised: 12/03/2010 Document Reviewed: 11/09/2007 [facility] Patient Information 2012 [place], [place].

## 2011-02-12 NOTE — Progress Notes (Signed)
  Subjective:    Patient ID: [name], male    DOB: 05-15-1917, 77 y.o.   MRN: [id number]  HPI  77 year old patient who is seen today for followup. He has COPD and chronic systolic heart failure;  he has done quite well recently with good appetite and  weight gain. Remains on chronic oxygen therapy but doing quite well hospital records were reviewed potassium supplementation discontinued due to hyperkalemia he is now on spironolactone in addition to low-dose furosemide. No symptoms of decompensated heart failure. No peripheral edema he does watch his salt intake closely. His main complaint is frequent belching. Denies any abdominal pain he has self discontinued Pepcid which he has used in the past    Review of Systems  Constitutional: Negative for fever, chills, appetite change and fatigue.  HENT: Negative for hearing loss, ear pain, congestion, sore throat, trouble swallowing, neck stiffness, dental problem, voice change and tinnitus.   Eyes: Negative for pain, discharge and visual disturbance.  Respiratory: Negative for cough, chest tightness, wheezing and stridor.   Cardiovascular: Negative for chest pain, palpitations and leg swelling.  Gastrointestinal: Positive for constipation. Negative for nausea, vomiting, abdominal pain, diarrhea, blood in stool and abdominal distention.  Genitourinary: Negative for urgency, hematuria, flank pain, discharge, difficulty urinating and genital sores.  Musculoskeletal: Negative for myalgias, back pain, joint swelling, arthralgias and gait problem.  Skin: Negative for rash.  Neurological: Negative for dizziness, syncope, speech difficulty, weakness, numbness and headaches.  Hematological: Negative for adenopathy. Does not bruise/bleed easily.  Psychiatric/Behavioral: Negative for behavioral problems and dysphoric mood. The patient is not nervous/anxious.        Objective:   Physical Exam  Constitutional: He is oriented to person, place, and  time. He appears well-developed.  HENT:  Head: Normocephalic.  Right Ear: External ear normal.  Left Ear: External ear normal.  Eyes: Conjunctivae and EOM are normal.  Neck: Normal range of motion.  Cardiovascular: Normal rate, regular rhythm and normal heart sounds.   Pulmonary/Chest: Effort normal and breath sounds normal.       Diminished breath sounds but clear  Abdominal: Bowel sounds are normal.  Musculoskeletal: Normal range of motion. He exhibits no edema and no tenderness.  Neurological: He is alert and oriented to person, place, and time.  Psychiatric: He has a normal mood and affect. His behavior is normal.          Assessment & Plan:   Chronic systolics heart failure. Appears well compensated COPD Hypertension. Blood pressure in a low-normal range today History of electrolyte abnormalities. We'll followup electrolytes  Recheck one month

## 2011-02-18 ENCOUNTER — Other Ambulatory Visit: Payer: Self-pay | Admitting: Internal Medicine

## 2011-03-10 ENCOUNTER — Other Ambulatory Visit: Payer: Self-pay | Admitting: Internal Medicine

## 2011-03-12 ENCOUNTER — Ambulatory Visit (INDEPENDENT_AMBULATORY_CARE_PROVIDER_SITE_OTHER): Payer: Medicare Other | Admitting: Internal Medicine

## 2011-03-12 ENCOUNTER — Encounter: Payer: Self-pay | Admitting: Internal Medicine

## 2011-03-12 DIAGNOSIS — J449 Chronic obstructive pulmonary disease, unspecified: Secondary | ICD-10-CM

## 2011-03-12 DIAGNOSIS — I5022 Chronic systolic (congestive) heart failure: Secondary | ICD-10-CM

## 2011-03-12 DIAGNOSIS — I2510 Atherosclerotic heart disease of native coronary artery without angina pectoris: Secondary | ICD-10-CM

## 2011-03-12 DIAGNOSIS — J4489 Other specified chronic obstructive pulmonary disease: Secondary | ICD-10-CM

## 2011-03-12 DIAGNOSIS — M4800 Spinal stenosis, site unspecified: Secondary | ICD-10-CM

## 2011-03-12 DIAGNOSIS — R110 Nausea: Secondary | ICD-10-CM

## 2011-03-12 DIAGNOSIS — I10 Essential (primary) hypertension: Secondary | ICD-10-CM

## 2011-03-12 MED ORDER — CEPHALEXIN 500 MG PO CAPS: 500.0000 mg | ORAL_CAPSULE | Freq: Two times a day (BID) | ORAL | Status: DC

## 2011-03-12 MED ORDER — HYDROCODONE-ACETAMINOPHEN 5-500 MG PO TABS
2.0000 | ORAL_TABLET | Freq: Four times a day (QID) | ORAL | Status: DC | PRN
Start: 2011-03-12 — End: 2011-04-02

## 2011-03-12 MED ORDER — PANTOPRAZOLE SODIUM 40 MG PO TBEC: 40.0000 mg | DELAYED_RELEASE_TABLET | Freq: Every day | ORAL | Status: DC

## 2011-03-12 NOTE — Progress Notes (Signed)
  Subjective:    Patient ID: [name], male    DOB: 1918-03-02, 77 y.o.   MRN: [id number]  HPI  77 year old patient who is seen today for followup. He has chronic atrial for ablation and chronic systolic heart failure as well as advanced COPD. He is on chronic oxygen therapy and his pulmonary status has been reasonably stable. Complaints today include a sty involving his right upper lid. He also has occasional nausea he does have a history of peptic ulcer disease as well as gastroesophageal reflux disease he complains of worsening back pain not relieved by tramadol.    Review of Systems  Constitutional: Negative for fever, chills, appetite change and fatigue.  HENT: Negative for hearing loss, ear pain, congestion, sore throat, trouble swallowing, neck stiffness, dental problem, voice change and tinnitus.   Eyes: Negative for pain, discharge and visual disturbance.  Respiratory: Positive for shortness of breath. Negative for cough, chest tightness, wheezing and stridor.   Cardiovascular: Negative for chest pain, palpitations and leg swelling.  Gastrointestinal: Positive for nausea and constipation. Negative for vomiting, abdominal pain, diarrhea, blood in stool and abdominal distention.  Genitourinary: Negative for urgency, hematuria, flank pain, discharge, difficulty urinating and genital sores.  Musculoskeletal: Positive for back pain, arthralgias and gait problem. Negative for myalgias and joint swelling.  Skin: Negative for rash.  Neurological: Negative for dizziness, syncope, speech difficulty, weakness, numbness and headaches.  Hematological: Negative for adenopathy. Does not bruise/bleed easily.  Psychiatric/Behavioral: Negative for behavioral problems and dysphoric mood. The patient is not nervous/anxious.        Objective:   Physical Exam  Constitutional: He is oriented to person, place, and time. He appears well-developed.  HENT:  Head: Normocephalic.  Right Ear:  External ear normal.  Left Ear: External ear normal.  Eyes: Conjunctivae and EOM are normal.  Neck: Normal range of motion.  Cardiovascular: Normal rate and normal heart sounds.        Controlled ventricular response  Pulmonary/Chest: Effort normal.       A few crackles at the bases but essentially clear  Abdominal: Soft. Bowel sounds are normal. He exhibits no distension. There is no tenderness.  Musculoskeletal: Normal range of motion. He exhibits no edema and no tenderness.  Neurological: He is alert and oriented to person, place, and time.  Psychiatric: He has a normal mood and affect. His behavior is normal.          Assessment & Plan:   Chronic systolic heart failure Advanced COPD Nausea. We'll treat with short-term PPI Chronic back pain secondary to spinal stenosis. We'll give a prescription for Vicodin. He will fill this only if his pain worsens he is very reluctant to consider since he is concerned about worsening constipation

## 2011-03-12 NOTE — Patient Instructions (Signed)
Limit your sodium (Salt) intake  Avoids foods high in acid such as tomatoes citrus juices, and spicy foods.  Avoid eating within two hours of lying down or before exercising.  Do not overheat.  Try smaller more frequent meals.  If symptoms persist, elevate the head of her bed 12 inches while sleeping.  Return in 3 months for follow-up  

## 2011-03-17 ENCOUNTER — Other Ambulatory Visit: Payer: Self-pay | Admitting: Internal Medicine

## 2011-03-19 ENCOUNTER — Ambulatory Visit (INDEPENDENT_AMBULATORY_CARE_PROVIDER_SITE_OTHER): Payer: Medicare Other | Admitting: Internal Medicine

## 2011-03-19 ENCOUNTER — Telehealth: Payer: Self-pay | Admitting: Internal Medicine

## 2011-03-19 ENCOUNTER — Encounter: Payer: Self-pay | Admitting: Internal Medicine

## 2011-03-19 DIAGNOSIS — I4891 Unspecified atrial fibrillation: Secondary | ICD-10-CM

## 2011-03-19 DIAGNOSIS — J449 Chronic obstructive pulmonary disease, unspecified: Secondary | ICD-10-CM

## 2011-03-19 DIAGNOSIS — I5022 Chronic systolic (congestive) heart failure: Secondary | ICD-10-CM

## 2011-03-19 DIAGNOSIS — J069 Acute upper respiratory infection, unspecified: Secondary | ICD-10-CM

## 2011-03-19 NOTE — Telephone Encounter (Signed)
He would like to be worked in this afternoon with Dr Kirtland Bouchard. His nebulizer isn't helping his cough and he is having leg pain. Please advise. Thanks.

## 2011-03-19 NOTE — Patient Instructions (Signed)
Limit your sodium (Salt) intake  Call or return to clinic prn if these symptoms worsen or fail to improve as anticipated.   

## 2011-03-19 NOTE — Progress Notes (Signed)
  Subjective:    Patient ID: [name], male    DOB: 1917-06-27, 77 y.o.   MRN: [id number]  HPI  77 year old patient who is seen today for followup. Patient has advanced COPD and has developed a URI over the past 2 days. He has had more shortness breath and has required albuterol use 5-6 times daily;  he is on chronic home oxygen therapy. He has cough and congestion but no significant sputum production or wheezing. He has chronic systolic heart failure and chronic atrial fibrillation. There has been no peripheral edema. He has had some voiding difficulties and does have a history of acute urinary retention.  His medication list reviewed and there were many duplications including both Proscar and Avodart, Prilosec and Protonix as well as Pepcid    Review of Systems  Constitutional: Positive for fatigue. Negative for fever, chills and appetite change.  HENT: Positive for congestion and rhinorrhea. Negative for hearing loss, ear pain, sore throat, trouble swallowing, neck stiffness, dental problem, voice change and tinnitus.   Eyes: Negative for pain, discharge and visual disturbance.  Respiratory: Positive for cough and shortness of breath. Negative for chest tightness, wheezing and stridor.   Cardiovascular: Negative for chest pain, palpitations and leg swelling.  Gastrointestinal: Negative for nausea, vomiting, abdominal pain, diarrhea, constipation, blood in stool and abdominal distention.  Genitourinary: Negative for urgency, hematuria, flank pain, discharge, difficulty urinating and genital sores.  Musculoskeletal: Negative for myalgias, back pain, joint swelling, arthralgias and gait problem.  Skin: Negative for rash.  Neurological: Positive for weakness. Negative for dizziness, syncope, speech difficulty, numbness and headaches.  Hematological: Negative for adenopathy. Does not bruise/bleed easily.  Psychiatric/Behavioral: Negative for behavioral problems and dysphoric mood. The  patient is not nervous/anxious.        Objective:   Physical Exam  Constitutional: He is oriented to person, place, and time. He appears well-developed.       Appears weak and unwell but in no acute distress. Nasal oxygen in place. Wheelchair-bound  HENT:  Head: Normocephalic.  Right Ear: External ear normal.  Left Ear: External ear normal.  Eyes: Conjunctivae and EOM are normal.  Neck: Normal range of motion.  Cardiovascular: Normal rate and normal heart sounds.        Rhythm 60-70  Pulmonary/Chest: Effort normal and breath sounds normal. No respiratory distress.       Oxygen saturation 97  Abdominal: Bowel sounds are normal.  Musculoskeletal: Normal range of motion. He exhibits no edema and no tenderness.       No pedal edema  Neurological: He is alert and oriented to person, place, and time.  Psychiatric: He has a normal mood and affect. His behavior is normal.          Assessment & Plan:   Viral URI in the setting of advanced COPD and chronic systolic heart failure. We'll continue to use nebulizer treatments 4-6 times daily as required;  we'll place on a mucolytic. We'll follow closely clinically. May need hospitalization if there is any deterioration

## 2011-03-19 NOTE — Telephone Encounter (Signed)
Ok per dr. Kirtland Bouchard to work in this afternoon

## 2011-03-20 ENCOUNTER — Ambulatory Visit: Payer: Self-pay | Admitting: Internal Medicine

## 2011-04-01 ENCOUNTER — Other Ambulatory Visit: Payer: Self-pay

## 2011-04-01 NOTE — Telephone Encounter (Signed)
Fax refill request from [facility] - for vicodin [phone]  Last seen 03/19/11 last written 03/12/11 #20  [id number] Please advise

## 2011-04-02 MED ORDER — HYDROCODONE-ACETAMINOPHEN 5-500 MG PO TABS: 2.0000 | ORAL_TABLET | Freq: Four times a day (QID) | ORAL | Status: DC | PRN

## 2011-04-02 NOTE — Telephone Encounter (Signed)
ok 

## 2011-04-16 ENCOUNTER — Encounter: Payer: Self-pay | Admitting: Internal Medicine

## 2011-04-16 ENCOUNTER — Ambulatory Visit (INDEPENDENT_AMBULATORY_CARE_PROVIDER_SITE_OTHER): Payer: Medicare Other | Admitting: Internal Medicine

## 2011-04-16 DIAGNOSIS — R110 Nausea: Secondary | ICD-10-CM

## 2011-04-16 DIAGNOSIS — I5022 Chronic systolic (congestive) heart failure: Secondary | ICD-10-CM

## 2011-04-16 DIAGNOSIS — I10 Essential (primary) hypertension: Secondary | ICD-10-CM

## 2011-04-16 DIAGNOSIS — K279 Peptic ulcer, site unspecified, unspecified as acute or chronic, without hemorrhage or perforation: Secondary | ICD-10-CM

## 2011-04-16 DIAGNOSIS — J449 Chronic obstructive pulmonary disease, unspecified: Secondary | ICD-10-CM

## 2011-04-16 NOTE — Patient Instructions (Signed)
Limit your sodium (Salt) intake   Call or return to clinic prn if these symptoms worsen or fail to improve as anticipated.  Use Zofran every 6 hours

## 2011-04-16 NOTE — Progress Notes (Signed)
  Subjective:    Patient ID: [name], male    DOB: 1917-09-22, 78 y.o.   MRN: [id number]  HPI  78 year old patient who has multiple medical problems. These include advanced COPD as well as coronary artery disease and chronic systolic heart failure. He has paroxysmal atrial fibrillation. He has a history of peptic ulcer disease as well as gastroesophageal reflux disease. He has been on chronic PPI therapy. For the past 2 weeks he has had some nausea and intermittent vomiting. It is unclear whether he is using his Zofran for nausea control. 2 nights ago he ate [place] food. There's been no change in his bowel habits. He does have some chronic constipation issues controlled with MiraLax.    Review of Systems  Constitutional: Negative for fever, chills, appetite change and fatigue.  HENT: Negative for hearing loss, ear pain, congestion, sore throat, trouble swallowing, neck stiffness, dental problem, voice change and tinnitus.   Eyes: Negative for pain, discharge and visual disturbance.  Respiratory: Positive for shortness of breath. Negative for cough, chest tightness, wheezing and stridor.   Cardiovascular: Negative for chest pain, palpitations and leg swelling.  Gastrointestinal: Positive for nausea, vomiting and constipation. Negative for abdominal pain, diarrhea, blood in stool and abdominal distention.  Genitourinary: Negative for urgency, hematuria, flank pain, discharge, difficulty urinating and genital sores.  Musculoskeletal: Negative for myalgias, back pain, joint swelling, arthralgias and gait problem.  Skin: Negative for rash.  Neurological: Negative for dizziness, syncope, speech difficulty, weakness, numbness and headaches.  Hematological: Negative for adenopathy. Does not bruise/bleed easily.  Psychiatric/Behavioral: Negative for behavioral problems and dysphoric mood. The patient is not nervous/anxious.        Objective:   Physical Exam  Constitutional: He is  oriented to person, place, and time. He appears well-developed.  HENT:  Head: Normocephalic.  Right Ear: External ear normal.  Left Ear: External ear normal.  Eyes: Conjunctivae and EOM are normal.  Neck: Normal range of motion.  Cardiovascular: Normal rate and normal heart sounds.   Pulmonary/Chest: Effort normal and breath sounds normal.       O2 saturation 94% with pulse rate 56  Abdominal: Soft. Bowel sounds are normal. He exhibits no distension. There is no tenderness.  Musculoskeletal: Normal range of motion. He exhibits no edema and no tenderness.  Neurological: He is alert and oriented to person, place, and time.  Psychiatric: He has a normal mood and affect. His behavior is normal.          Assessment & Plan:   Episodic nausea and vomiting. We'll hold Celebrex. Will use Zofran every 6 hours as needed. Compliance with Protonix encouraged;   we'll treat constipation aggressively. Will call if unimproved Advanced COPD stable Systolic heart failure stable

## 2011-04-29 ENCOUNTER — Encounter: Payer: Self-pay | Admitting: Internal Medicine

## 2011-04-29 ENCOUNTER — Ambulatory Visit (INDEPENDENT_AMBULATORY_CARE_PROVIDER_SITE_OTHER): Payer: Medicare Other | Admitting: Internal Medicine

## 2011-04-29 DIAGNOSIS — K219 Gastro-esophageal reflux disease without esophagitis: Secondary | ICD-10-CM

## 2011-04-29 DIAGNOSIS — I4891 Unspecified atrial fibrillation: Secondary | ICD-10-CM

## 2011-04-29 DIAGNOSIS — J449 Chronic obstructive pulmonary disease, unspecified: Secondary | ICD-10-CM

## 2011-04-29 DIAGNOSIS — I10 Essential (primary) hypertension: Secondary | ICD-10-CM

## 2011-04-29 MED ORDER — DIGOXIN 125 MCG PO TABS: ORAL_TABLET | ORAL | Status: DC

## 2011-04-29 MED ORDER — FLUTICASONE PROPIONATE 50 MCG/ACT NA SUSP: 2.0000 | Freq: Every day | NASAL | Status: DC

## 2011-04-29 NOTE — Patient Instructions (Signed)
Increase Protonix twice daily for one week and then resume once daily Use nasal spray daily Continue Zyrtec daily  Hold Lanoxin for 2 days then start one half tablet daily

## 2011-04-29 NOTE — Progress Notes (Signed)
  Subjective:    Patient ID: [name], male    DOB: [birth year], 78 y.o.   MRN: [id number]  HPI  78 year old patient who is seen today with a chief complaint of nausea of one month's duration. Medical problems include advanced cardiopulmonary disease. Additionally he has a history of gastroesophageal reflux disease as well as remote peptic ulcer disease. He remains on Protonix which has been helpful. He also has been treated with Zofran was temporarily controlled the nausea. There has been no vomiting. He complains of increased belching and does have chronic postnasal drip. Denies any productive cough. Medical regimen does include Zyrtec as well as Lanoxin for rate control    Review of Systems  Constitutional: Negative for fever, chills, appetite change and fatigue.  HENT: Negative for hearing loss, ear pain, congestion, sore throat, trouble swallowing, neck stiffness, dental problem, voice change and tinnitus.   Eyes: Negative for pain, discharge and visual disturbance.  Respiratory: Positive for shortness of breath. Negative for cough, chest tightness, wheezing and stridor.   Cardiovascular: Negative for chest pain, palpitations and leg swelling.  Gastrointestinal: Positive for nausea. Negative for vomiting, abdominal pain, diarrhea, constipation, blood in stool and abdominal distention.  Genitourinary: Negative for urgency, hematuria, flank pain, discharge, difficulty urinating and genital sores.  Musculoskeletal: Positive for back pain and arthralgias. Negative for myalgias, joint swelling and gait problem.  Skin: Negative for rash.  Neurological: Negative for dizziness, syncope, speech difficulty, weakness, numbness and headaches.  Hematological: Negative for adenopathy. Does not bruise/bleed easily.  Psychiatric/Behavioral: Negative for behavioral problems and dysphoric mood. The patient is not nervous/anxious.        Objective:   Physical Exam  Constitutional: He is  oriented to person, place, and time. He appears well-developed.       Elderly thin no acute distress. Blood pressure 110/74. Patient does have frequent belching during the encounter  HENT:  Head: Normocephalic.  Right Ear: External ear normal.  Left Ear: External ear normal.  Eyes: Conjunctivae and EOM are normal.  Neck: Normal range of motion.  Cardiovascular: Normal rate and normal heart sounds.        Irregular rhythm with a controlled ventricular response  Pulmonary/Chest: Effort normal.       Generally diminished breath sounds but clear  Abdominal: Soft. Bowel sounds are normal. He exhibits no distension. There is no tenderness.  Musculoskeletal: Normal range of motion. He exhibits no edema and no tenderness.  Neurological: He is alert and oriented to person, place, and time.  Psychiatric: He has a normal mood and affect. His behavior is normal.          Assessment & Plan:   Nausea. Will decrease Lanoxin to one half tablet daily and hold for 48 hours. We'll try to improve his rhinorrhea by adding a nasal steroid. We'll increase Protonix twice a day for one week and then resume once daily. Recheck in one month Continue other Zofran when necessary nausea  COPD. Remarkably stable Chronic systolic heart failure stable

## 2011-05-13 ENCOUNTER — Other Ambulatory Visit: Payer: Self-pay | Admitting: Internal Medicine

## 2011-05-13 ENCOUNTER — Telehealth: Payer: Self-pay

## 2011-05-13 MED ORDER — HYDROCODONE-ACETAMINOPHEN 5-500 MG PO TABS: 2.0000 | ORAL_TABLET | Freq: Four times a day (QID) | ORAL | Status: DC | PRN

## 2011-05-13 NOTE — Telephone Encounter (Signed)
#  [phone]  RF [phone] 

## 2011-05-13 NOTE — Telephone Encounter (Signed)
Rx called in to pharmacy. 

## 2011-05-13 NOTE — Telephone Encounter (Signed)
Rx request for hydrocodone-acetaminophen 5-500 #20 with 0 refills.  Pt last seen 04/29/11.  Pls advise.

## 2011-05-21 ENCOUNTER — Ambulatory Visit (INDEPENDENT_AMBULATORY_CARE_PROVIDER_SITE_OTHER): Payer: Medicare Other | Admitting: Internal Medicine

## 2011-05-21 ENCOUNTER — Encounter: Payer: Self-pay | Admitting: Internal Medicine

## 2011-05-21 DIAGNOSIS — I10 Essential (primary) hypertension: Secondary | ICD-10-CM

## 2011-05-21 DIAGNOSIS — R0600 Dyspnea, unspecified: Secondary | ICD-10-CM

## 2011-05-21 DIAGNOSIS — R269 Unspecified abnormalities of gait and mobility: Secondary | ICD-10-CM

## 2011-05-21 DIAGNOSIS — I4891 Unspecified atrial fibrillation: Secondary | ICD-10-CM

## 2011-05-21 DIAGNOSIS — R0609 Other forms of dyspnea: Secondary | ICD-10-CM

## 2011-05-21 DIAGNOSIS — R0989 Other specified symptoms and signs involving the circulatory and respiratory systems: Secondary | ICD-10-CM

## 2011-05-21 DIAGNOSIS — I2510 Atherosclerotic heart disease of native coronary artery without angina pectoris: Secondary | ICD-10-CM

## 2011-05-21 NOTE — Progress Notes (Signed)
  Subjective:    Patient ID: [name], male    DOB: 01-07-1918, 78 y.o.   MRN: [id number]  HPI   78 year old patient who has a history of advanced COPD as well as chronic systolic heart failure. He has coronary artery disease as well as paroxysmal atrial fibrillation. He has gait instability and presently is treated with Plavix and not felt to be a candidate for Coumadin anticoagulation. He slept poorly last night and presents with chief complaint of weakness and fatigue. His cardiopulmonary status appears to be fairly stable. His cup by his wife and caregiver he is a little chair bound    Review of Systems  Constitutional: Positive for fatigue. Negative for fever, chills and appetite change.  HENT: Negative for hearing loss, ear pain, congestion, sore throat, trouble swallowing, neck stiffness, dental problem, voice change and tinnitus.   Eyes: Negative for pain, discharge and visual disturbance.  Respiratory: Positive for shortness of breath. Negative for cough, chest tightness, wheezing and stridor.   Cardiovascular: Negative for chest pain, palpitations and leg swelling.  Gastrointestinal: Negative for nausea, vomiting, abdominal pain, diarrhea, constipation, blood in stool and abdominal distention.  Genitourinary: Negative for urgency, hematuria, flank pain, discharge, difficulty urinating and genital sores.  Musculoskeletal: Positive for arthralgias and gait problem. Negative for myalgias, back pain and joint swelling.  Skin: Negative for rash.  Neurological: Positive for weakness. Negative for dizziness, syncope, speech difficulty, numbness and headaches.  Hematological: Negative for adenopathy. Does not bruise/bleed easily.  Psychiatric/Behavioral: Negative for behavioral problems and dysphoric mood. The patient is not nervous/anxious.        Objective:   Physical Exam  Constitutional: He is oriented to person, place, and time. He appears well-developed.       Alert no  distress. Blood pressure low normal.  HENT:  Head: Normocephalic.  Right Ear: External ear normal.  Left Ear: External ear normal.  Eyes: Conjunctivae and EOM are normal.  Neck: Normal range of motion.  Cardiovascular: Normal rate and normal heart sounds.   Pulmonary/Chest: Effort normal and breath sounds normal. No respiratory distress. He has no wheezes.       Diminished breath sounds but clear. Oxygen saturation 93% pulse rate 55  Abdominal: Bowel sounds are normal.  Musculoskeletal: Normal range of motion. He exhibits no edema and no tenderness.  Neurological: He is alert and oriented to person, place, and time.  Psychiatric: He has a normal mood and affect. His behavior is normal.          Assessment & Plan:   Chronic systolic heart failure. Appears to be fairly well compensated. Will continue Monday Wednesday Friday furosemide COPD Weakness. Multifactorial insomnia last night likely a significant factor.  We'll recheck in 2 months or as needed

## 2011-05-21 NOTE — Patient Instructions (Signed)
Limit your sodium (Salt) intake  Call or return to clinic prn if these symptoms worsen or fail to improve as anticipated.   

## 2011-05-22 ENCOUNTER — Other Ambulatory Visit: Payer: Self-pay | Admitting: Internal Medicine

## 2011-05-25 MED ORDER — LEVOTHYROXINE SODIUM 50 MCG PO TABS: 50.0000 ug | ORAL_TABLET | Freq: Every day | ORAL | Status: DC

## 2011-05-29 ENCOUNTER — Encounter: Payer: Self-pay | Admitting: Internal Medicine

## 2011-05-29 ENCOUNTER — Ambulatory Visit (INDEPENDENT_AMBULATORY_CARE_PROVIDER_SITE_OTHER): Payer: Medicare Other | Admitting: Internal Medicine

## 2011-05-29 DIAGNOSIS — R269 Unspecified abnormalities of gait and mobility: Secondary | ICD-10-CM

## 2011-05-29 DIAGNOSIS — R5381 Other malaise: Secondary | ICD-10-CM

## 2011-05-29 DIAGNOSIS — I5022 Chronic systolic (congestive) heart failure: Secondary | ICD-10-CM

## 2011-05-29 DIAGNOSIS — M1990 Unspecified osteoarthritis, unspecified site: Secondary | ICD-10-CM

## 2011-05-29 DIAGNOSIS — I10 Essential (primary) hypertension: Secondary | ICD-10-CM

## 2011-05-29 DIAGNOSIS — R5383 Other fatigue: Secondary | ICD-10-CM

## 2011-05-29 DIAGNOSIS — I4891 Unspecified atrial fibrillation: Secondary | ICD-10-CM

## 2011-05-29 LAB — BASIC METABOLIC PANEL
BUN: 34 mg/dL — ABNORMAL HIGH (ref 6–23)
CO2: 30 mEq/L (ref 19–32)
Chloride: 98 mEq/L (ref 96–112)
Creatinine, Ser: 1 mg/dL (ref 0.4–1.5)

## 2011-05-29 LAB — CBC WITH DIFFERENTIAL/PLATELET
Eosinophils Absolute: 0.1 10*3/uL (ref 0.0–0.7)
MCHC: 33 g/dL (ref 30.0–36.0)
MCV: 89 fl (ref 78.0–100.0)
Monocytes Absolute: 0.5 10*3/uL (ref 0.1–1.0)
Neutrophils Relative %: 65.8 % (ref 43.0–77.0)
Platelets: 176 10*3/uL (ref 150.0–400.0)
RDW: 14.1 % (ref 11.5–14.6)

## 2011-05-29 MED ORDER — CARVEDILOL 12.5 MG PO TABS: ORAL_TABLET | ORAL | Status: DC

## 2011-05-29 MED ORDER — DILTIAZEM HCL ER COATED BEADS 120 MG PO CP24: 120.0000 mg | ORAL_CAPSULE | Freq: Every day | ORAL | Status: DC

## 2011-05-29 NOTE — Patient Instructions (Signed)
Physical therapy as discussed  Limit your sodium (Salt) intake  Decrease diltiazem 2 to 120 mg daily  Decrease Coreg to 6.25 mg twice daily  Call or return to clinic prn if these symptoms worsen or fail to improve as anticipated.  Return in one month for follow-up

## 2011-05-29 NOTE — Progress Notes (Signed)
  Subjective:    Patient ID: [name], male    DOB: [birth year], 78 y.o.   MRN: [id number]  HPI  78 year old patient who has a history of chronic systolic heart failure COPD and chronic atrial fibrillation. His chief complaint today is worsening fatigue. He now is on chronic oxygen therapy. Apparently he is also on Coreg 12.5 mg twice a day. This was changed by pulmonary medicine to by- systolic in the past.  He states that he has benefited in the past with physical therapy and wishes to resume. Denies any worsening shortness of breath or productive cough no chest pain no peripheral edema or PND or orthopnea. He remains on low-dose Lasix and has had hyponatremia in the past he feels that he is a significant fall risk due to his worsening fatigue. He does use a walker    Review of Systems  Constitutional: Positive for fatigue. Negative for fever, chills and appetite change.  HENT: Negative for hearing loss, ear pain, congestion, sore throat, trouble swallowing, neck stiffness, dental problem, voice change and tinnitus.   Eyes: Negative for pain, discharge and visual disturbance.  Respiratory: Negative for cough, chest tightness, wheezing and stridor.   Cardiovascular: Negative for chest pain, palpitations and leg swelling.  Gastrointestinal: Negative for nausea, vomiting, abdominal pain, diarrhea, constipation, blood in stool and abdominal distention.  Genitourinary: Negative for urgency, hematuria, flank pain, discharge, difficulty urinating and genital sores.  Musculoskeletal: Positive for back pain, joint swelling and gait problem. Negative for myalgias and arthralgias.  Skin: Negative for rash.  Neurological: Positive for weakness. Negative for dizziness, syncope, speech difficulty, numbness and headaches.  Hematological: Negative for adenopathy. Does not bruise/bleed easily.  Psychiatric/Behavioral: Negative for behavioral problems and dysphoric mood. The patient is not  nervous/anxious.        Objective:   Physical Exam  Constitutional: He is oriented to person, place, and time. He appears well-developed.       Blood pressure 90/58  HENT:  Head: Normocephalic.  Right Ear: External ear normal.  Left Ear: External ear normal.  Eyes: Conjunctivae and EOM are normal.  Neck: Normal range of motion.  Cardiovascular: Normal rate and normal heart sounds.        Irregular rhythm with a controlled ventricular response  Pulmonary/Chest: Effort normal. No respiratory distress. He has no wheezes. He has no rales.       Diminished breath sounds but clear. O2 saturation 92% on supplemental oxygen  Abdominal: Soft. Bowel sounds are normal.  Musculoskeletal: Normal range of motion. He exhibits no edema and no tenderness.       No peripheral edema  Neurological: He is alert and oriented to person, place, and time.  Psychiatric: He has a normal mood and affect. His behavior is normal.          Assessment & Plan:   Fatigue probably multifactorial. We'll check a CBC as well as chemistries. We'll obtain a consult for home physical therapy Chronic atrial fibrillation Hypertension. We'll decrease Coreg to 6.25 mg twice daily COPD Osteoarthritis

## 2011-06-04 ENCOUNTER — Ambulatory Visit (INDEPENDENT_AMBULATORY_CARE_PROVIDER_SITE_OTHER): Payer: Medicare Other | Admitting: Internal Medicine

## 2011-06-04 ENCOUNTER — Encounter: Payer: Self-pay | Admitting: Internal Medicine

## 2011-06-04 DIAGNOSIS — R2681 Unsteadiness on feet: Secondary | ICD-10-CM

## 2011-06-04 DIAGNOSIS — R269 Unspecified abnormalities of gait and mobility: Secondary | ICD-10-CM

## 2011-06-04 DIAGNOSIS — R531 Weakness: Secondary | ICD-10-CM

## 2011-06-04 DIAGNOSIS — R5381 Other malaise: Secondary | ICD-10-CM

## 2011-06-04 MED ORDER — CARVEDILOL 6.25 MG PO TABS: ORAL_TABLET | ORAL | Status: DC

## 2011-06-04 NOTE — Progress Notes (Signed)
  Subjective:    Patient ID: [name], male    DOB: 07/19/1917, 78 y.o.   MRN: [id number]  HPI   78 year old patient who is seen today for followup. Since his last visit here he is said to falls. Denies any real dizziness and he does have a long history of an unstable gait. There's been no syncope. He does complain of persistent weakness. He also has mild URI symptoms and cough;  blood pressure today was much lower  in the 90/60 range. He remains on Coreg 12.5 mg twice daily. It had been recommended in the past to decrease this to one half tablet twice. There's been no weight gain or fluid retention. He remains on both low-dose Lasix and Aldactone   Review of Systems  Constitutional: Positive for fatigue.  Musculoskeletal: Positive for gait problem.  Neurological: Positive for weakness.       Objective:   Physical Exam  Constitutional: He is oriented to person, place, and time. He appears well-developed.       Blood pressure 90/60 Pulse 60  HENT:  Head: Normocephalic.  Right Ear: External ear normal.  Left Ear: External ear normal.  Eyes: Conjunctivae and EOM are normal.  Neck: Normal range of motion.  Cardiovascular: Normal rate and normal heart sounds.   Pulmonary/Chest:       Breath sounds diminished  Abdominal: Bowel sounds are normal.  Musculoskeletal: Normal range of motion. He exhibits no edema and no tenderness.  Neurological: He is alert and oriented to person, place, and time.  Psychiatric: He has a normal mood and affect. His behavior is normal.          Assessment & Plan:   Chronic congestive heart failure Weakness  Appears euvolemic. We'll hold Lasix at this time and continue Aldactone. We'll decrease Coreg. We'll also hold the diltiazem until reassessment. PT consult will be obtained

## 2011-06-04 NOTE — Patient Instructions (Addendum)
Limit your sodium (Salt) intake  Return in one month for follow-up  Hold furosemide (Lasix)  Hold diltiazem (Cardizem CD)

## 2011-06-05 ENCOUNTER — Other Ambulatory Visit: Payer: Self-pay | Admitting: Internal Medicine

## 2011-06-09 ENCOUNTER — Ambulatory Visit (INDEPENDENT_AMBULATORY_CARE_PROVIDER_SITE_OTHER): Payer: Medicare Other | Admitting: Internal Medicine

## 2011-06-09 ENCOUNTER — Ambulatory Visit: Payer: Self-pay | Admitting: Internal Medicine

## 2011-06-09 ENCOUNTER — Encounter: Payer: Self-pay | Admitting: Internal Medicine

## 2011-06-09 DIAGNOSIS — R0789 Other chest pain: Secondary | ICD-10-CM

## 2011-06-09 DIAGNOSIS — R071 Chest pain on breathing: Secondary | ICD-10-CM

## 2011-06-09 DIAGNOSIS — I5022 Chronic systolic (congestive) heart failure: Secondary | ICD-10-CM

## 2011-06-09 DIAGNOSIS — I2510 Atherosclerotic heart disease of native coronary artery without angina pectoris: Secondary | ICD-10-CM

## 2011-06-09 DIAGNOSIS — I4891 Unspecified atrial fibrillation: Secondary | ICD-10-CM

## 2011-06-09 DIAGNOSIS — I10 Essential (primary) hypertension: Secondary | ICD-10-CM

## 2011-06-09 NOTE — Progress Notes (Signed)
  Subjective:    Patient ID: [name], male    DOB: Feb 06, 1918, 78 y.o.   MRN: [id number]  HPI  78 year old patient has a history of advanced COPD as well as chronic systolic heart failure. He fell over the weekend and has had some left-sided chest wall pain. Last night he had subsignificant sharp pain with movement and was concerned about his heart. He is on chronic O2 therapy and his cardiac ampullary status seems fairly stable. He has chronic atrial fibrillation   Review of Systems  Constitutional: Negative for fever, chills, appetite change and fatigue.  HENT: Negative for hearing loss, ear pain, congestion, sore throat, trouble swallowing, neck stiffness, dental problem, voice change and tinnitus.   Eyes: Negative for pain, discharge and visual disturbance.  Respiratory: Negative for cough, chest tightness, wheezing and stridor.   Cardiovascular: Positive for chest pain (left-sided chest wall pain). Negative for palpitations and leg swelling.  Gastrointestinal: Negative for nausea, vomiting, abdominal pain, diarrhea, constipation, blood in stool and abdominal distention.  Genitourinary: Negative for urgency, hematuria, flank pain, discharge, difficulty urinating and genital sores.  Musculoskeletal: Negative for myalgias, back pain, joint swelling, arthralgias and gait problem.  Skin: Negative for rash.  Neurological: Negative for dizziness, syncope, speech difficulty, weakness, numbness and headaches.  Hematological: Negative for adenopathy. Does not bruise/bleed easily.  Psychiatric/Behavioral: Negative for behavioral problems and dysphoric mood. The patient is not nervous/anxious.        Objective:   Physical Exam  Constitutional: He is oriented to person, place, and time. He appears well-developed.       Elderly alert frail but in no acute distress. Nasal oxygen in place  HENT:  Head: Normocephalic.  Right Ear: External ear normal.  Left Ear: External ear normal.    Eyes: Conjunctivae and EOM are normal.  Neck: Normal range of motion.  Cardiovascular: Normal rate and normal heart sounds.        Irregular rhythm with controlled ventricular response  Pulmonary/Chest: Effort normal. No respiratory distress. He has no wheezes. He exhibits tenderness.       Diminished breast sounds but fairly clear with only rare scattered rhonchi  Left-sided chest wall tenderness laterally  Abdominal: Bowel sounds are normal.  Musculoskeletal: Normal range of motion. He exhibits no edema and no tenderness.  Neurological: He is alert and oriented to person, place, and time.  Psychiatric: He has a normal mood and affect. His behavior is normal.          Assessment & Plan:   Chest wall pain secondary to recent fall Stable cardiopulmonary status Chronic atrial fibrillation  We'll continue present regimen. Patient was reassured Return here when necessary or as scheduled No change in medication

## 2011-06-18 ENCOUNTER — Other Ambulatory Visit: Payer: Self-pay | Admitting: Internal Medicine

## 2011-06-23 DIAGNOSIS — I4891 Unspecified atrial fibrillation: Secondary | ICD-10-CM

## 2011-06-23 DIAGNOSIS — R269 Unspecified abnormalities of gait and mobility: Secondary | ICD-10-CM

## 2011-06-23 DIAGNOSIS — I509 Heart failure, unspecified: Secondary | ICD-10-CM

## 2011-06-23 DIAGNOSIS — IMO0001 Reserved for inherently not codable concepts without codable children: Secondary | ICD-10-CM

## 2011-06-24 ENCOUNTER — Other Ambulatory Visit: Payer: Self-pay

## 2011-06-24 NOTE — Telephone Encounter (Signed)
Last seen 06/09/11  Last written 05/13/11 # [phone] [id number] Please advise

## 2011-06-25 ENCOUNTER — Other Ambulatory Visit: Payer: Self-pay | Admitting: Internal Medicine

## 2011-06-25 MED ORDER — HYDROCODONE-ACETAMINOPHEN 5-500 MG PO TABS: 2.0000 | ORAL_TABLET | Freq: Four times a day (QID) | ORAL | Status: DC | PRN

## 2011-06-25 NOTE — Telephone Encounter (Signed)
ok 

## 2011-06-30 ENCOUNTER — Other Ambulatory Visit: Payer: Self-pay

## 2011-06-30 MED ORDER — LORAZEPAM 0.5 MG PO TABS: 0.5000 mg | ORAL_TABLET | Freq: Two times a day (BID) | ORAL | Status: DC

## 2011-07-07 ENCOUNTER — Other Ambulatory Visit: Payer: Self-pay | Admitting: Internal Medicine

## 2011-07-09 ENCOUNTER — Telehealth: Payer: Self-pay

## 2011-07-09 NOTE — Telephone Encounter (Signed)
Triage VM:  Pt states he has a simple problem that he needs to speak with Selena Batten about.

## 2011-07-09 NOTE — Telephone Encounter (Signed)
Spoke with pt- discuss loose stools - encouraged him to use benifiber qd - and ok to use miralax qd if then has difficulty stooling

## 2011-07-12 ENCOUNTER — Emergency Department (HOSPITAL_COMMUNITY): Payer: Medicare Other

## 2011-07-12 ENCOUNTER — Encounter (HOSPITAL_COMMUNITY): Payer: Self-pay | Admitting: Emergency Medicine

## 2011-07-12 ENCOUNTER — Other Ambulatory Visit: Payer: Self-pay

## 2011-07-12 ENCOUNTER — Observation Stay (HOSPITAL_COMMUNITY)
Admission: EM | Admit: 2011-07-12 | Discharge: 2011-07-13 | Disposition: A | Discharge status: Home / Self Care | Payer: Medicare Other | Attending: Internal Medicine | Admitting: Internal Medicine

## 2011-07-12 DIAGNOSIS — Z9861 Coronary angioplasty status: Secondary | ICD-10-CM | POA: Insufficient documentation

## 2011-07-12 DIAGNOSIS — I6350 Cerebral infarction due to unspecified occlusion or stenosis of unspecified cerebral artery: Secondary | ICD-10-CM

## 2011-07-12 DIAGNOSIS — E039 Hypothyroidism, unspecified: Secondary | ICD-10-CM

## 2011-07-12 DIAGNOSIS — I2510 Atherosclerotic heart disease of native coronary artery without angina pectoris: Secondary | ICD-10-CM

## 2011-07-12 DIAGNOSIS — R300 Dysuria: Secondary | ICD-10-CM

## 2011-07-12 DIAGNOSIS — R110 Nausea: Secondary | ICD-10-CM

## 2011-07-12 DIAGNOSIS — I739 Peripheral vascular disease, unspecified: Secondary | ICD-10-CM

## 2011-07-12 DIAGNOSIS — R079 Chest pain, unspecified: Principal | ICD-10-CM

## 2011-07-12 DIAGNOSIS — J4489 Other specified chronic obstructive pulmonary disease: Secondary | ICD-10-CM

## 2011-07-12 DIAGNOSIS — M4800 Spinal stenosis, site unspecified: Secondary | ICD-10-CM

## 2011-07-12 DIAGNOSIS — R609 Edema, unspecified: Secondary | ICD-10-CM

## 2011-07-12 DIAGNOSIS — I4891 Unspecified atrial fibrillation: Secondary | ICD-10-CM

## 2011-07-12 DIAGNOSIS — I509 Heart failure, unspecified: Secondary | ICD-10-CM | POA: Insufficient documentation

## 2011-07-12 DIAGNOSIS — K219 Gastro-esophageal reflux disease without esophagitis: Secondary | ICD-10-CM

## 2011-07-12 DIAGNOSIS — R5383 Other fatigue: Secondary | ICD-10-CM

## 2011-07-12 DIAGNOSIS — K279 Peptic ulcer, site unspecified, unspecified as acute or chronic, without hemorrhage or perforation: Secondary | ICD-10-CM

## 2011-07-12 DIAGNOSIS — Z8673 Personal history of transient ischemic attack (TIA), and cerebral infarction without residual deficits: Secondary | ICD-10-CM | POA: Insufficient documentation

## 2011-07-12 DIAGNOSIS — M1990 Unspecified osteoarthritis, unspecified site: Secondary | ICD-10-CM

## 2011-07-12 DIAGNOSIS — M954 Acquired deformity of chest and rib: Secondary | ICD-10-CM

## 2011-07-12 DIAGNOSIS — J441 Chronic obstructive pulmonary disease with (acute) exacerbation: Secondary | ICD-10-CM

## 2011-07-12 DIAGNOSIS — N401 Enlarged prostate with lower urinary tract symptoms: Secondary | ICD-10-CM

## 2011-07-12 DIAGNOSIS — J449 Chronic obstructive pulmonary disease, unspecified: Secondary | ICD-10-CM | POA: Insufficient documentation

## 2011-07-12 DIAGNOSIS — Z9981 Dependence on supplemental oxygen: Secondary | ICD-10-CM | POA: Insufficient documentation

## 2011-07-12 DIAGNOSIS — R269 Unspecified abnormalities of gait and mobility: Secondary | ICD-10-CM

## 2011-07-12 DIAGNOSIS — I10 Essential (primary) hypertension: Secondary | ICD-10-CM

## 2011-07-12 DIAGNOSIS — R339 Retention of urine, unspecified: Secondary | ICD-10-CM

## 2011-07-12 DIAGNOSIS — I5022 Chronic systolic (congestive) heart failure: Secondary | ICD-10-CM

## 2011-07-12 DIAGNOSIS — R0600 Dyspnea, unspecified: Secondary | ICD-10-CM

## 2011-07-12 LAB — POCT I-STAT TROPONIN I
Troponin i, poc: 0.02 ng/mL (ref 0.00–0.08)
Troponin i, poc: 0.02 ng/mL (ref 0.00–0.08)

## 2011-07-12 LAB — CBC
Hemoglobin: 12.1 g/dL — ABNORMAL LOW (ref 13.0–17.0)
MCH: 28.9 pg (ref 26.0–34.0)
MCV: 87.8 fL (ref 78.0–100.0)
RBC: 4.19 MIL/uL — ABNORMAL LOW (ref 4.22–5.81)
WBC: 8.7 10*3/uL (ref 4.0–10.5)

## 2011-07-12 LAB — BASIC METABOLIC PANEL
CO2: 27 mEq/L (ref 19–32)
Glucose, Bld: 116 mg/dL — ABNORMAL HIGH (ref 70–99)
Potassium: 5 mEq/L (ref 3.5–5.1)
Sodium: 131 mEq/L — ABNORMAL LOW (ref 135–145)

## 2011-07-12 LAB — DIFFERENTIAL
Basophils Relative: 0 % (ref 0–1)
Eosinophils Relative: 2 % (ref 0–5)
Lymphs Abs: 1.8 10*3/uL (ref 0.7–4.0)
Monocytes Absolute: 0.8 10*3/uL (ref 0.1–1.0)
Monocytes Relative: 9 % (ref 3–12)
Neutrophils Relative %: 68 % (ref 43–77)

## 2011-07-12 MED ORDER — BUDESONIDE-FORMOTEROL FUMARATE 160-4.5 MCG/ACT IN AERO
2.0000 | INHALATION_SPRAY | Freq: Two times a day (BID) | RESPIRATORY_TRACT | Status: DC
  Administered 2011-07-13: 2 via RESPIRATORY_TRACT
  Filled 2011-07-12: qty 6

## 2011-07-12 MED ORDER — ALBUTEROL SULFATE (5 MG/ML) 0.5% IN NEBU
2.5000 mg | INHALATION_SOLUTION | Freq: Four times a day (QID) | RESPIRATORY_TRACT | Status: DC
  Administered 2011-07-13 (×3): 2.5 mg via RESPIRATORY_TRACT
  Filled 2011-07-12 (×3): qty 0.5

## 2011-07-12 MED ORDER — HEPARIN SODIUM (PORCINE) 5000 UNIT/ML IJ SOLN
5000.0000 [IU] | Freq: Three times a day (TID) | INTRAMUSCULAR | Status: DC
  Administered 2011-07-13 (×2): 5000 [IU] via SUBCUTANEOUS
  Filled 2011-07-12 (×4): qty 1

## 2011-07-12 MED ORDER — CARVEDILOL 6.25 MG PO TABS
6.2500 mg | ORAL_TABLET | Freq: Two times a day (BID) | ORAL | Status: DC
  Administered 2011-07-13 (×2): 6.25 mg via ORAL
  Filled 2011-07-12 (×3): qty 1

## 2011-07-12 MED ORDER — SENNA 8.6 MG PO TABS
1.0000 | ORAL_TABLET | Freq: Two times a day (BID) | ORAL | Status: DC
  Administered 2011-07-13: 8.6 mg via ORAL
  Filled 2011-07-12 (×2): qty 1

## 2011-07-12 MED ORDER — CLOPIDOGREL BISULFATE 75 MG PO TABS: 75.0000 mg | ORAL_TABLET | Freq: Once | ORAL | Status: DC

## 2011-07-12 MED ORDER — ONDANSETRON HCL 4 MG/2ML IJ SOLN: 4.0000 mg | Freq: Four times a day (QID) | INTRAMUSCULAR | Status: DC | PRN

## 2011-07-12 MED ORDER — ASPIRIN EC 325 MG PO TBEC
325.0000 mg | DELAYED_RELEASE_TABLET | Freq: Every day | ORAL | Status: DC
Start: 2011-07-13 — End: 2011-07-13
  Administered 2011-07-13: 325 mg via ORAL
  Filled 2011-07-12 (×2): qty 1

## 2011-07-12 MED ORDER — NITROGLYCERIN 0.4 MG SL SUBL: 0.4000 mg | SUBLINGUAL_TABLET | SUBLINGUAL | Status: DC | PRN

## 2011-07-12 MED ORDER — SPIRONOLACTONE 25 MG PO TABS
25.0000 mg | ORAL_TABLET | Freq: Two times a day (BID) | ORAL | Status: DC
  Administered 2011-07-13: 25 mg via ORAL
  Filled 2011-07-12 (×2): qty 1

## 2011-07-12 MED ORDER — ALBUTEROL SULFATE (5 MG/ML) 0.5% IN NEBU: 2.5000 mg | INHALATION_SOLUTION | RESPIRATORY_TRACT | Status: DC | PRN

## 2011-07-12 MED ORDER — ACETAMINOPHEN 325 MG PO TABS: 650.0000 mg | ORAL_TABLET | Freq: Four times a day (QID) | ORAL | Status: DC | PRN

## 2011-07-12 MED ORDER — DUTASTERIDE 0.5 MG PO CAPS
0.5000 mg | ORAL_CAPSULE | Freq: Every day | ORAL | Status: DC
  Administered 2011-07-13: 0.5 mg via ORAL
  Filled 2011-07-12: qty 1

## 2011-07-12 MED ORDER — MORPHINE SULFATE 2 MG/ML IJ SOLN: 2.0000 mg | INTRAMUSCULAR | Status: DC | PRN

## 2011-07-12 MED ORDER — ACETAMINOPHEN 650 MG RE SUPP: 650.0000 mg | Freq: Four times a day (QID) | RECTAL | Status: DC | PRN

## 2011-07-12 MED ORDER — DILTIAZEM HCL ER COATED BEADS 120 MG PO CP24
120.0000 mg | ORAL_CAPSULE | Freq: Every day | ORAL | Status: DC
  Administered 2011-07-13: 120 mg via ORAL
  Filled 2011-07-12: qty 1

## 2011-07-12 MED ORDER — SODIUM CHLORIDE 0.9 % IV SOLN
Freq: Once | INTRAVENOUS | Status: AC
  Administered 2011-07-13: via INTRAVENOUS

## 2011-07-12 MED ORDER — SODIUM CHLORIDE 0.9 % IJ SOLN
3.0000 mL | Freq: Two times a day (BID) | INTRAMUSCULAR | Status: DC
  Administered 2011-07-12: 3 mL via INTRAVENOUS

## 2011-07-12 MED ORDER — DIGOXIN 125 MCG PO TABS
0.1250 mg | ORAL_TABLET | Freq: Every day | ORAL | Status: DC
  Administered 2011-07-13: 0.125 mg via ORAL
  Filled 2011-07-12: qty 1

## 2011-07-12 MED ORDER — TAMSULOSIN HCL 0.4 MG PO CAPS
0.4000 mg | ORAL_CAPSULE | Freq: Every day | ORAL | Status: DC
  Administered 2011-07-13: 0.4 mg via ORAL
  Filled 2011-07-12: qty 1

## 2011-07-12 MED ORDER — DOCUSATE SODIUM 100 MG PO CAPS
100.0000 mg | ORAL_CAPSULE | Freq: Two times a day (BID) | ORAL | Status: DC
  Administered 2011-07-13: 100 mg via ORAL
  Filled 2011-07-12 (×2): qty 1

## 2011-07-12 MED ORDER — IPRATROPIUM BROMIDE 0.02 % IN SOLN
0.5000 mg | Freq: Four times a day (QID) | RESPIRATORY_TRACT | Status: DC
  Administered 2011-07-13 (×3): 0.5 mg via RESPIRATORY_TRACT
  Filled 2011-07-12 (×3): qty 2.5

## 2011-07-12 MED ORDER — FUROSEMIDE 20 MG PO TABS
20.0000 mg | ORAL_TABLET | Freq: Every day | ORAL | Status: DC
  Administered 2011-07-13: 20 mg via ORAL
  Filled 2011-07-12: qty 1

## 2011-07-12 MED ORDER — LEVOTHYROXINE SODIUM 50 MCG PO TABS
50.0000 ug | ORAL_TABLET | Freq: Every day | ORAL | Status: DC
Start: 2011-07-13 — End: 2011-07-13
  Administered 2011-07-13: 50 ug via ORAL
  Filled 2011-07-12 (×2): qty 1

## 2011-07-12 MED ORDER — ASPIRIN 325 MG PO TABS: 325.0000 mg | ORAL_TABLET | Freq: Every day | ORAL | Status: DC

## 2011-07-12 MED ORDER — ONDANSETRON HCL 4 MG PO TABS: 4.0000 mg | ORAL_TABLET | Freq: Four times a day (QID) | ORAL | Status: DC | PRN

## 2011-07-12 NOTE — H&P (Signed)
PCP:  Rogelia Boga, MD, MD  Confirmed Doesn't have cardiologist  Chief Complaint:  Chest pain  HPI: 89yoM with h/o advanced COPD on home O2, AFib, prior CVA, h/o CAD s/p stents x3 with prior nuclear  showing myocardia scar in septal, inferoseptal, and inferolateral wall, last EF 55-60% in 03/2009  presents with exertional chest pain.   Pt was last admitted to [facility] 12/2010 with orthopnea, SOB, CHF exacerbation. He was diuresed.  Course complicated by bladder outlet obstruction, was Foley'ed with 1200 UOP, this was left in on  d/c. He has also seen his PCP a couple times in Feb for worsening fatigue, gait instability and  falls, for which coreg dose was decreased. On 3/5 visit, was noted to have fallen and with left  sided chest wall pain, had "significant sharp pain with movement and was worried about his heart."   Now pt is moderate historian, can state that he has been doing well recently. He tries to walk  with his walker daily for exercise and to stay healthy, and is usually able to walk 10 minutes  without much dyspnea and no angina. He sometimes walks with his portable O2, sometimes not. Today,  he went to church and came back, ate, tried to nap, couldn't, went for a walk. He pushed himself  farther than he usually goes, and at some indeterminate unclear point developed left sided chest  pain described as sharp, without radiation and no associated dizziness, lightheadedness, nausea,  vomiting. He apparently made it back to the house and rested. The time course is unclear but  possibly overall pain lasted an hour. Pt is unable to elaborate whether there was a clear pattern  of exertional worsening/relieved with rest. At home he took SL NTG every 5 mins with some relief.  EMS was called, gave him ASA.   In the ED, pt minimally tachy to 100's. Labs with minimal hypoNa 131/hypoCl 95, renal 24/0.8, Trop  POC negative, WBC normal except mild left shift (?) but otherwise  normal/stable. CXR wihtout  active disease, but with scoliosis and tortuous aorta and calcifications, cardiomegaly.   ROS otherwise negative. No fevers, chills, sweats. He has baseline SOB. No GI symptoms. He states  he does not have chronic angina. ROS otherwise negative.    Past Medical History  Diagnosis Date  . Abnormality of gait 09/28/2008  . Acquired deformity of chest and rib 01/14/2009  . Atrial fibrillation 09/30/2006  . BENIGN PROSTATIC HYPERTROPHY, WITH URINARY OBSTRUCTION 09/04/2009  . CHRONIC OBSTRUCTIVE PULMONARY DISEASE, ACUTE EXACERBATION 03/24/2007  . Chronic systolic heart failure 08/06/2008  . COPD 09/30/2006  . CORONARY ARTERY DISEASE 10/01/2006  . GERD 04/19/2007  . HYPERTENSION 09/30/2006  . HYPOTHYROIDISM 09/30/2006  . LASSITUDE 06/13/2007  . Osteoarth NOS-Unspec 09/30/2006  . PEPTIC ULCER DISEASE 09/30/2006  . PERIPHERAL VASCULAR DISEASE 09/30/2006  . SPINAL STENOSIS 08/21/2009  . STROKE 08/28/2008    Past Surgical History  Procedure Date  . Appendectomy   . Hernia repair   . Tonsillectomy   . Abdominal aortic aneurysm repair   . Transurethral resection of prostate   . Lumbar laminectomy     Medications:  HOME MEDS: Pt can name a couple meds, but not many Prior to Admission medications   Medication Sig Start Date End Date Taking? Authorizing Provider  carvedilol (COREG) 6.25 MG tablet One half tablet twice daily 06/04/11  Yes Gordy Savers, MD  clopidogrel (PLAVIX) 75 MG tablet TAKE 1 TABLET EVERY DAY 05/13/11  Yes Theron Arista  Lysle Dingwall, MD  diltiazem (CARDIZEM CD) 120 MG 24 hr capsule Take 1 capsule (120 mg total) by mouth daily. 05/29/11 05/28/12 Yes Gordy Savers, MD  furosemide (LASIX) 20 MG tablet TAKE 1 TABLET EVERY DAY 05/15/10  Yes Gordy Savers, MD  spironolactone (ALDACTONE) 25 MG tablet Take 1 tablet (25 mg total) by mouth 2 (two) times daily. 01/16/11 01/16/12 Yes Gordy Savers, MD  AVODART 0.5 MG capsule TAKE 1 CAPSULE EVERY DAY  03/10/11   Gordy Savers, MD  budesonide-formoterol [facility]) 160-4.5 MCG/ACT inhaler Inhale 2 puffs into the lungs 2 (two) times daily. 11/25/10 11/25/11  Gordy Savers, MD  celecoxib (CELEBREX) 200 MG capsule Take 1 capsule (200 mg total) by mouth daily. 09/29/10 09/29/11  Gordy Savers, MD  digoxin (LANOXIN) 0.125 MG tablet One half tablet daily 04/29/11   Gordy Savers, MD  fluticasone [facility]) 50 MCG/ACT nasal spray Place 2 sprays into the nose daily. 04/29/11 04/28/12  Gordy Savers, MD  HYDROcodone-acetaminophen (VICODIN) 5-500 MG per tablet Take 2 tablets by mouth every 6 (six) hours as needed for pain. 06/25/11 06/24/12  Gordy Savers, MD  ipratropium (ATROVENT) 0.02 % nebulizer solution USE VIAL NEBULIZER TWICE A DAY 07/07/11   Gordy Savers, MD  ipratropium-albuterol (DUONEB) 0.5-2.5 (3) MG/3ML SOLN USE THREE TIMES DAILY AS NEEDED 10/04/10   Gordy Savers, MD  KLOR-CON 10 10 MEQ tablet TAKE 1 TABLET BY MOUTH EVERY DAY 06/05/11   Gordy Savers, MD  levothyroxine (SYNTHROID, LEVOTHROID) 50 MCG tablet Take 1 tablet (50 mcg total) by mouth daily. 05/25/11   Gordy Savers, MD  loratadine (CLARITIN) 10 MG tablet TAKE 1 TABLET BY MOUTH EVERY DAY 02/18/11   Gordy Savers, MD  LORazepam (ATIVAN) 0.5 MG tablet Take 1 tablet (0.5 mg total) by mouth 2 (two) times daily. 06/30/11   Gordy Savers, MD  nitroGLYCERIN (NITROSTAT) 0.4 MG SL tablet USE AS DIRECTED AS NEEDED 06/02/10   Gordy Savers, MD  ondansetron (ZOFRAN) 4 MG tablet Take 4 mg by mouth every 8 (eight) hours as needed.  01/29/11   Historical Provider, MD  pantoprazole (PROTONIX) 40 MG tablet TAKE 1 TABLET BY MOUTH ONCE DAILY 06/25/11   Gordy Savers, MD  polyethylene glycol (MIRALAX / GLYCOLAX) packet TAKE 17 GRAMS BY MOUTH DAILY. 01/14/11   Gordy Savers, MD  Tamsulosin HCl (FLOMAX) 0.4 MG CAPS TAKE ONE CAPSULE BY MOUTH EVERY DAY 06/18/11   Gordy Savers, MD    traMADol (ULTRAM) 50 MG tablet Take 1 tablet (50 mg total) by mouth every 6 (six) hours as needed. 10/13/10   Gordy Savers, MD    Allergies:  Allergies  Allergen Reactions  . Bleph-10 (Sulfacetamide Sodium)   . Procaine Hcl     REACTION: syncope    Social History:   reports that he quit smoking about 63 years ago. His smoking use included Cigarettes. He has a 16 pack-year smoking history. He has never used smokeless tobacco. He reports that he does not drink alcohol or use illicit drugs. Lives at home with wife and has caretaker, still ambulatory daily with a walker. Had 2 children, 1 passed away.   Family History: No family history on file.  Physical Exam: Filed Vitals:   07/12/11 1722 07/12/11 1806  BP: 108/59 111/67  Pulse: 105 99  Temp: 98.1 F (36.7 C)   TempSrc: Oral   Resp: 18 18  SpO2: 99% 100%  Blood pressure 111/67, pulse 99, temperature 98.1 F (36.7 C), temperature source Oral, resp. rate 18, SpO2 100.00%.  Gen: Quite elderly M, very thin and frail appearing, no distress, able to answer questions well  and appropriately, moderate historian. Stable appearing, breathing comfortably.  HEENT: Pupils round and reactive, EOMI, sclera/irises clear and normal. Mouth/tongue quite dry  appearing Lungs: Poor air movement, hardly any at all, but no adventitious lung sounds, no wheezes or  crackles noted Heart: Irregular, hard to heard S1/2 clearly except near the apex, where no m/g appreciated. Left  chest there is an apparent rib deformity, a hard knot of bone noted, with some minimal TTP, he  jumps when I pressed on his ribs  Abd: Soft, non tender, non distended, no facial grimacing, overall normal exam Extrem: Decreased muscle bulk, very thin, but no BLE edema noted. Radials palpable. No rigidity or  cogwheeling Neuro: Alert, attentive, follows conversation and answers appropriately, no aphasia or slurring CN  2-12 intact, moves extremities on his own, using  arms/hands well and able to lift BLE's off the ED  stretcher. Grossly non-focal.   Labs & Imaging Results for orders placed during the hospital encounter of 07/12/11 (from the past 48 hour(s))  BASIC METABOLIC PANEL     Status: Abnormal   Collection Time   07/12/11  5:45 PM      Component Value Range Comment   Sodium 131 (*) 135 - 145 (mEq/L)    Potassium 5.0  3.5 - 5.1 (mEq/L)    Chloride 95 (*) 96 - 112 (mEq/L)    CO2 27  19 - 32 (mEq/L)    Glucose, Bld 116 (*) 70 - 99 (mg/dL)    BUN 24 (*) 6 - 23 (mg/dL)    Creatinine, Ser 1.61  0.50 - 1.35 (mg/dL)    Calcium 9.6  8.4 - 10.5 (mg/dL)    GFR calc non Af Amer 76 (*) >90 (mL/min)    GFR calc Af Amer 88 (*) >90 (mL/min)   CBC     Status: Abnormal   Collection Time   07/12/11  5:45 PM      Component Value Range Comment   WBC 8.7  4.0 - 10.5 (K/uL)    RBC 4.19 (*) 4.22 - 5.81 (MIL/uL)    Hemoglobin 12.1 (*) 13.0 - 17.0 (g/dL)    HCT 09.6 (*) 04.5 - 52.0 (%)    MCV 87.8  78.0 - 100.0 (fL)    MCH 28.9  26.0 - 34.0 (pg)    MCHC 32.9  30.0 - 36.0 (g/dL)    RDW 40.9  81.1 - 91.4 (%)    Platelets 152  150 - 400 (K/uL)   DIFFERENTIAL     Status: Normal   Collection Time   07/12/11  5:45 PM      Component Value Range Comment   Neutrophils Relative 68  43 - 77 (%)    Lymphocytes Relative 21  12 - 46 (%)    Monocytes Relative 9  3 - 12 (%)    Eosinophils Relative 2  0 - 5 (%)    Basophils Relative 0  0 - 1 (%)    Neutro Abs 5.9  1.7 - 7.7 (K/uL)    Lymphs Abs 1.8  0.7 - 4.0 (K/uL)    Monocytes Absolute 0.8  0.1 - 1.0 (K/uL)    Eosinophils Absolute 0.2  0.0 - 0.7 (K/uL)    Basophils Absolute 0.0  0.0 - 0.1 (K/uL)  WBC Morphology MILD LEFT SHIFT (1-5% METAS, OCC MYELO, OCC BANDS)     POCT I-STAT TROPONIN I     Status: Normal   Collection Time   07/12/11  5:52 PM      Component Value Range Comment   Troponin i, poc 0.02  0.00 - 0.08 (ng/mL)    Comment 3            POCT I-STAT TROPONIN I     Status: Normal   Collection Time   07/12/11   9:40 PM      Component Value Range Comment   Troponin i, poc 0.02  0.00 - 0.08 (ng/mL)    Comment 3             Dg Chest Port 1 View  07/12/2011  *RADIOLOGY REPORT*  Clinical Data: Chest pain  PORTABLE CHEST - 1 VIEW  Comparison: 01/25/2011  Findings: Cardiomegaly again noted.  Mild thoracic dextroscoliosis. Tortuous thoracic aorta with atherosclerotic calcifications again noted.  No acute infiltrate or pulmonary edema.  Mild left basilar atelectasis.  IMPRESSION: No active disease.  Mild thoracic dextroscoliosis.  Again noted tortuous thoracic aorta with atherosclerotic calcifications. Cardiomegaly.  Original Report Authenticated By: Natasha Mead, M.D.    ECG: Afib with rate 105 bpm, with some bigeminal beats, LAD with LAFB. Narrow QRS, V1-3 possible Q  waves. No frank ST deviations, normal T waves, possibly a bit hyperacute V4-5. In comparison to  prior, is not bradycardic, but overall the complexes appear similar. The T waves appear a bit more  hyperacute.   03/2005 IMPRESSION: 1.  No evidence of pharmacologic-induced myocardial ischemia.  Large area of myocardial scar  involving the septal and inferoseptal walls, and inferolateral wall.   2.  Septal and inferior wall hypokinesis, with calculated left ventricular ejection fraction of  36%.  03/2009  Study Conclusions  - Left ventricle: The cavity size was mildly dilated. Wall thickness    was increased in a pattern of mild LVH. Systolic function was    normal. The estimated ejection fraction was in the range of 55% to    60%.  - Aortic valve: There was mild stenosis. Mild regurgitation. Valve    area: 0.96cm^2(VTI). Valve area: 1.12cm^2 (Vmax).  - Mitral valve: Redundant chordal structures. Not likely a source of    embolus Mild regurgitation.  - Left atrium: The atrium was moderately dilated.  - Right atrium: The atrium was mildly dilated.  - Atrial septum: No defect or patent foramen ovale was identified.   Impression Present on  Admission:  .Chest pain on exertion .Atrial fibrillation .Acquired deformity of chest and rib .COPD .CORONARY ARTERY DISEASE .HYPERTENSION   89yoM with h/o advanced COPD on home O2, AFib, prior CVA, h/o CAD s/p stents x3 with prior nuclear  showing myocardia scar in septal, inferoseptal, and inferolateral wall, last EF 55-60% in 03/2009  presents with exertional chest pain.   1. Chest pain: Pt has prior h/o stenting and documented scar tissue on prior perfusion study, so  definitely a risky patient. Clinicaly history moderately concerning (new onset exertional  symptoms, possibly relieved with rest/nitro's), however also was tender to palpation of chest and  has apparent chest wall deformity, with "deformity of chest and rib" noted in his PHx. Therefore,  although no current evidence of infarction, pt could have exertional angina.   Therefore, admit for rule out MI reasonable, and if negative would consider stress test as outpt  if pt asymptomatic (as he currently is). However,  one could argue about the benefit of cathing a  89yoM, but would defer this to outpt cardiologist (which he needs to be set up with, has prior  stenting but was in [facility]).   DDx includes MSK chest pain as above, uncontrolled AFib with exertion (minimal RVR on resting  ECG), or air trapping/chest tightness due to COPD.   - Rule out MI with enzymes, trend ECG, start ASA 325 for now. Not overwhelmed by his story for  anticoagulation at present. SL NTG prn.  - If rules out, can likely d/c but needs cardiologist outpt appt  - Continue home plavix, coreg, aldactone, lasix   2. AFib: continue home dilt, coreg, digoxin. Rate pretty well controlled at present. Not on  anticoag.  3. COPD: continue home duoneb, symbicort 4. GU: Continue home flomax, avodart  5. Thyroid: continue home med  6. Needs official medication reconciliation. I tried. Holding all non essential medications for  now.   Telemetry, [facility]  team 9 DNR, discussed with pt  Other plans as per orders.   Marjon Doxtater 07/12/2011, 10:13 PM

## 2011-07-12 NOTE — ED Provider Notes (Signed)
History     CSN: [id number]  Arrival date & time 07/12/11  1712   First MD Initiated Contact with Patient 07/12/11 1729      Chief Complaint  Patient presents with  . Chest Pain   PCP: [place] (Consider location/radiation/quality/duration/timing/severity/associated sxs/prior treatment) HPI This 78 year old male has a history of chronic generalized weakness, chronic unstable gait, chronic atrial fibrillation, chf, copd, cad, and walks almost every day with a walker approximately 1/4 of a mile without experiencing chest discomfort until today. He states today he was out for his walk when he developed a rather sharp left-sided chest discomfort without radiation or associated symptoms such as shortness of breath lightheadedness sweats but did have some mild nausea, he stopped and rested and started walking again when his discomfort got worse, he stopped a second time and his discomfort started to ease off so he started walking again the discomfort again got worse, he then got home and took 3 nitroglycerin and in his chest discomfort completely resolved. He received 4 baby aspirin from EMS prior to arrival to the ED. He is currently pain-free. He takes Plavix because he could not tolerate Coumadin according to patient for his chronic atrial fibrillation. He states he has a poor memory at baseline Past Medical History  Diagnosis Date  . Abnormality of gait 09/28/2008  . Acquired deformity of chest and rib 01/14/2009  . Atrial fibrillation 09/30/2006  . BENIGN PROSTATIC HYPERTROPHY, WITH URINARY OBSTRUCTION 09/04/2009  . CHRONIC OBSTRUCTIVE PULMONARY DISEASE, ACUTE EXACERBATION 03/24/2007  . Chronic systolic heart failure 08/06/2008  . COPD 09/30/2006  . CORONARY ARTERY DISEASE 10/01/2006  . GERD 04/19/2007  . HYPERTENSION 09/30/2006  . HYPOTHYROIDISM 09/30/2006  . LASSITUDE 06/13/2007  . Osteoarth NOS-Unspec 09/30/2006  . PEPTIC ULCER DISEASE 09/30/2006  . PERIPHERAL VASCULAR DISEASE 09/30/2006  . SPINAL  STENOSIS 08/21/2009  . STROKE 08/28/2008   chronic generalized weakness, chronic unstable gait, chronic atrial fibrillation, chf, copd, cad, poor memory  Past Surgical History  Procedure Date  . Appendectomy   . Hernia repair   . Tonsillectomy   . Abdominal aortic aneurysm repair   . Transurethral resection of prostate   . Lumbar laminectomy     No family history on file.  History  Substance Use Topics  . Smoking status: Former Smoker -- 0.8 packs/day for 20 years    Types: Cigarettes    Quit date: 04/06/1948  . Smokeless tobacco: Never Used  . Alcohol Use: No      Review of Systems  Constitutional: Negative for fever.       10 Systems reviewed and are negative for acute change except as noted in the HPI.  HENT: Negative for congestion.   Eyes: Negative for discharge and redness.  Respiratory: Negative for cough and shortness of breath.   Cardiovascular: Positive for chest pain. Negative for palpitations and leg swelling.  Gastrointestinal: Positive for nausea. Negative for vomiting and abdominal pain.  Musculoskeletal: Negative for back pain.  Skin: Negative for rash.  Neurological: Negative for syncope, light-headedness, numbness and headaches.  Psychiatric/Behavioral:       No behavior change.    Allergies  Bleph-10 and Procaine hcl  Home Medications   No current outpatient prescriptions on file.  BP 110/67  Pulse 91  Temp(Src) 97.9 F (36.6 C) (Oral)  Resp 18  Ht 5\' 11"  (1.803 m)  Wt 113 lb 5.1 oz (51.4 kg)  BMI 15.80 kg/m2  SpO2 94%  Physical Exam  Nursing note and vitals reviewed.  Constitutional:       Awake, alert, nontoxic appearance.  HENT:  Head: Atraumatic.  Eyes: Right eye exhibits no discharge. Left eye exhibits no discharge.  Neck: Neck supple.  Cardiovascular:  No murmur heard.      Mildly tachycardic, irregularly irregular rate and rhythm  Pulmonary/Chest: Effort normal and breath sounds normal. No respiratory distress. He has no  wheezes. He has no rales. He exhibits no tenderness.  Abdominal: Soft. Bowel sounds are normal. There is no tenderness. There is no rebound and no guarding.  Musculoskeletal: He exhibits no edema and no tenderness.       Baseline ROM, no obvious new focal weakness.  Neurological:       Mental status and motor strength appears baseline for patient and situation.  Skin: No rash noted.  Psychiatric: He has a normal mood and affect.    ED Course  Procedures (including critical care time) ECG: Atrial fibrillation with ventricular rate 105, left axis deviation, premature ventricular complexes, no acute ischemic changes noted, no significant change noted compared with October 2012 Labs Reviewed  BASIC METABOLIC PANEL - Abnormal; Notable for the following:    Sodium 131 (*)    Chloride 95 (*)    Glucose, Bld 116 (*)    BUN 24 (*)    GFR calc non Af Amer 76 (*)    GFR calc Af Amer 88 (*)    All other components within normal limits  CBC - Abnormal; Notable for the following:    RBC 4.19 (*)    Hemoglobin 12.1 (*)    HCT 36.8 (*)    All other components within normal limits  BASIC METABOLIC PANEL - Abnormal; Notable for the following:    Sodium 133 (*)    GFR calc non Af Amer 83 (*)    All other components within normal limits  CBC - Abnormal; Notable for the following:    RBC 4.02 (*)    Hemoglobin 11.4 (*)    HCT 35.0 (*)    Platelets 133 (*)    All other components within normal limits  DIFFERENTIAL  POCT I-STAT TROPONIN I  POCT I-STAT TROPONIN I  CARDIAC PANEL(CRET KIN+CKTOT+MB+TROPI)  CARDIAC PANEL(CRET KIN+CKTOT+MB+TROPI)  CARDIAC PANEL(CRET KIN+CKTOT+MB+TROPI)   Dg Chest Port 1 View  07/12/2011  *RADIOLOGY REPORT*  Clinical Data: Chest pain  PORTABLE CHEST - 1 VIEW  Comparison: 01/25/2011  Findings: Cardiomegaly again noted.  Mild thoracic dextroscoliosis. Tortuous thoracic aorta with atherosclerotic calcifications again noted.  No acute infiltrate or pulmonary edema.  Mild  left basilar atelectasis.  IMPRESSION: No active disease.  Mild thoracic dextroscoliosis.  Again noted tortuous thoracic aorta with atherosclerotic calcifications. Cardiomegaly.  Original Report Authenticated By: Natasha Mead, M.D.     Dx: Chest Pain    MDM  Patient / Family / Caregiver understand and agree with initial ED impression and plan with expectations set for ED visit. Pt stable in ED with no significant deterioration in condition. Patient / Family / Caregiver informed of clinical course, understand medical decision-making process, and agree with plan. D/w Med for admit. The patient appears reasonably stabilized for admission considering the current resources, flow, and capabilities available in the ED at this time, and I doubt any other [facility] requiring further screening and/or treatment in the ED prior to admission.        Hurman Horn, MD 07/13/11 1352

## 2011-07-12 NOTE — ED Notes (Addendum)
Pt complains of chest pain that started at 1400 while taking a walk. Left sided pain with no radiation. It first started at a 7/10. After 3 nitros it went down to a 4/10 when EMS saw him. Initial VS 130/70 BP and RR 18. Previous stroke with no motor deficits just speech impediment. No pain noted now. EMS gave 325 of aspirin and another nitro SL. History of COPD. No IVs.

## 2011-07-13 ENCOUNTER — Other Ambulatory Visit: Payer: Self-pay

## 2011-07-13 LAB — CARDIAC PANEL(CRET KIN+CKTOT+MB+TROPI)
CK, MB: 3.2 ng/mL (ref 0.3–4.0)
Relative Index: INVALID (ref 0.0–2.5)
Relative Index: INVALID (ref 0.0–2.5)
Relative Index: INVALID (ref 0.0–2.5)
Total CK: 69 U/L (ref 7–232)
Total CK: 55 U/L (ref 7–232)
Troponin I: <0.3 ng/mL (ref <0.30)

## 2011-07-13 LAB — BASIC METABOLIC PANEL
CO2: 30 mEq/L (ref 19–32)
Calcium: 8.9 mg/dL (ref 8.4–10.5)
Chloride: 97 mEq/L (ref 96–112)
Creatinine, Ser: 0.64 mg/dL (ref 0.50–1.35)
Glucose, Bld: 89 mg/dL (ref 70–99)

## 2011-07-13 LAB — CBC
HCT: 35 % — ABNORMAL LOW (ref 39.0–52.0)
MCH: 28.4 pg (ref 26.0–34.0)
MCV: 87.1 fL (ref 78.0–100.0)
Platelets: 133 10*3/uL — ABNORMAL LOW (ref 150–400)
RBC: 4.02 MIL/uL — ABNORMAL LOW (ref 4.22–5.81)
WBC: 5.7 10*3/uL (ref 4.0–10.5)

## 2011-07-13 MED ORDER — POTASSIUM & SODIUM PHOSPHATES 280-160-250 MG PO PACK
1.0000 | PACK | Freq: Three times a day (TID) | ORAL | Status: DC
  Filled 2011-07-13 (×4): qty 1

## 2011-07-13 MED ORDER — ASPIRIN 325 MG PO TBEC: 325.0000 mg | DELAYED_RELEASE_TABLET | Freq: Every day | ORAL | Status: AC

## 2011-07-13 MED ORDER — DUTASTERIDE 0.5 MG PO CAPS: 0.5000 mg | ORAL_CAPSULE | Freq: Every day | ORAL | Status: DC

## 2011-07-13 MED ORDER — FLUTICASONE PROPIONATE 50 MCG/ACT NA SUSP: 2.0000 | Freq: Every day | NASAL | Status: AC

## 2011-07-13 MED ORDER — BUDESONIDE-FORMOTEROL FUMARATE 160-4.5 MCG/ACT IN AERO: 2.0000 | INHALATION_SPRAY | Freq: Two times a day (BID) | RESPIRATORY_TRACT | Status: AC

## 2011-07-13 MED ORDER — FUROSEMIDE 20 MG PO TABS: 20.0000 mg | ORAL_TABLET | Freq: Every day | ORAL | Status: DC

## 2011-07-13 NOTE — Progress Notes (Signed)
07/13/2011 [name] Case Management Note [phone]  HOME HEALTH AGENCIES SERVING [name]   Agencies that are Medicare-Certified and are affiliated with [facility]  Telephone Number Address  [facility]   [facility] has ownership interest in this company; however, you are under no obligation to use this agency. [phone] or  [phone] [address] [place], [place] [address]   Agencies that are Medicare-Certified and are not affiliated with [facility] Telephone Number Address  [facility] [phone] Fax [phone] [address] [place], [place]  [address]  [facility] [phone] or [phone] Fax [phone] [address] [place], [place] [address]  [name] [phone] Fax [phone] [address] [place], [place] [address]  [facility] [phone] Fax [phone] [address] [place], [place]  [address]  [facility] [phone] Fax [phone] [address], [address] [place], [place] [address]  [name] [phone] [address] [place], [place] [address]  [facility] [phone]  [address] [place], [place] [address]  [name] [phone] or [phone] Fax [phone] [place], [place]  [address]  [facility] [phone] Fax [phone] [address] [place], [place]  [address]  [name]  [phone] Fax [phone] [address] [place], [place] [address]               Agencies that are not Medicare-Certified and are not affiliated with [name] Telephone Number Address  [name] [place] [phone] or [phone] Fax [phone] [address] [place], [place]  [address]  [name] [phone] Fax [phone] [address] [place], [place]  [address]  [facility]  [phone] Fax [phone] [address] [place], [place] [address]  [facility] [phone] Fax [phone] [address] [place], [place] [address]  [name] [phone] or [phone] Fax [phone] [address] [place], [place]  [address]  [facility] [phone] or [phone] Fax [phone] [address] [place], [place]  [address]  [facility] [phone] Fax [phone] [address] [place], [place]  [address]  [facility] [phone] [address] [place], [place]  [address]  [name] [phone] Fax [phone] [address] [place], [place]  [address]  [name] [phone] Fax [phone] [address] [place], [place]  [address]  [name] [phone] Fax [phone] [address] [place], [place] [address]  [facility] [phone] Fax [phone] [address] [place], [place]  [address]   In to offer patient choice of home health agencies. [name]  chosen. Will make appropriate referrals.

## 2011-07-13 NOTE — Progress Notes (Signed)
Utilization review completed. [name] 07/13/2011 

## 2011-07-13 NOTE — Progress Notes (Signed)
Subjective: Patient is not having CP- says he thinks it is musculoskeletal No SOB + cough- chronic, no changes Wants to go home  Objective: Vital signs in last 24 hours: Filed Vitals:   07/13/11 0154 07/13/11 0500 07/13/11 0928 07/13/11 0949  BP:  104/61 110/67   Pulse:  91    Temp:  97.9 F (36.6 C)    TempSrc:  Oral    Resp:  18    Height:      Weight:      SpO2: 100% 98%  94%   Weight change:   Intake/Output Summary (Last 24 hours) at 07/13/11 1116 Last data filed at 07/13/11 0500  Gross per 24 hour  Intake    720 ml  Output    300 ml  Net    420 ml    Physical Exam: General: Awake, Oriented, No acute distress. HEENT: EOMI. Neck: Supple CV: irregular Lungs: Clear to ascultation bilaterally, no wheezing Abdomen: Soft, Nontender, Nondistended, +bowel sounds. Ext: Good pulses. Trace edema.   Lab Results:  Basename 07/13/11 0530 07/12/11 1745  NA 133* 131*  K 4.2 5.0  CL 97 95*  CO2 30 27  GLUCOSE 89 116*  BUN 21 24*  CREATININE 0.64 0.80  CALCIUM 8.9 9.6  MG -- --  PHOS -- --   No results found for this basename: AST:2,ALT:2,ALKPHOS:2,BILITOT:2,PROT:2,ALBUMIN:2 in the last 72 hours No results found for this basename: LIPASE:2,AMYLASE:2 in the last 72 hours  Basename 07/13/11 0530 07/12/11 1745  WBC 5.7 8.7  NEUTROABS -- 5.9  HGB 11.4* 12.1*  HCT 35.0* 36.8*  MCV 87.1 87.8  PLT 133* 152    Basename 07/13/11 0530 07/12/11 2329  CKTOTAL 55 69  CKMB 3.0 3.2  CKMBINDEX -- --  TROPONINI <0.30 <0.30   No components found with this basename: POCBNP:3 No results found for this basename: DDIMER:2 in the last 72 hours No results found for this basename: HGBA1C:2 in the last 72 hours No results found for this basename: CHOL:2,HDL:2,LDLCALC:2,TRIG:2,CHOLHDL:2,LDLDIRECT:2 in the last 72 hours No results found for this basename: TSH,T4TOTAL,FREET3,T3FREE,THYROIDAB in the last 72 hours No results found for this basename:  VITAMINB12:2,FOLATE:2,FERRITIN:2,TIBC:2,IRON:2,RETICCTPCT:2 in the last 72 hours  Micro Results: No results found for this or any previous visit (from the past 240 hour(s)).  Studies/Results: Dg Chest Port 1 View  07/12/2011  *RADIOLOGY REPORT*  Clinical Data: Chest pain  PORTABLE CHEST - 1 VIEW  Comparison: 01/25/2011  Findings: Cardiomegaly again noted.  Mild thoracic dextroscoliosis. Tortuous thoracic aorta with atherosclerotic calcifications again noted.  No acute infiltrate or pulmonary edema.  Mild left basilar atelectasis.  IMPRESSION: No active disease.  Mild thoracic dextroscoliosis.  Again noted tortuous thoracic aorta with atherosclerotic calcifications. Cardiomegaly.  Original Report Authenticated By: Natasha Mead, M.D.    Medications: I have reviewed the patient's current medications. Scheduled Meds:   . sodium chloride   Intravenous Once  . albuterol  2.5 mg Nebulization Q6H  . aspirin EC  325 mg Oral Daily  . budesonide-formoterol  2 puff Inhalation BID  . carvedilol  6.25 mg Oral BID WC  . clopidogrel  75 mg Oral Once  . digoxin  0.125 mg Oral Daily  . diltiazem  120 mg Oral Daily  . docusate sodium  100 mg Oral BID  . dutasteride  0.5 mg Oral Daily  . furosemide  20 mg Oral Daily  . heparin  5,000 Units Subcutaneous Q8H  . ipratropium  0.5 mg Nebulization Q6H  . levothyroxine  50  mcg Oral QAC breakfast  . potassium & sodium phosphates  1 packet Oral TID WC & HS  . senna  1 tablet Oral BID  . sodium chloride  3 mL Intravenous Q12H  . spironolactone  25 mg Oral BID  . Tamsulosin HCl  0.4 mg Oral Daily  . DISCONTD: aspirin  325 mg Oral Daily   Continuous Infusions:  PRN Meds:.acetaminophen, acetaminophen, albuterol, morphine, nitroGLYCERIN, ondansetron (ZOFRAN) IV, ondansetron  Assessment/Plan:   *Chest pain on exertion- CE neg x 2 await 3rd set, no new c/o, resolved, says his PCP can refer him to a cardiologist when he sees him in the office   HYPERTENSION-  stable   CORONARY ARTERY DISEASE- stable   Atrial fibrillation- rate controlled, plavix, as he could not take coumdain   COPD   Acquired deformity of chest and rib  Await PT eval- patient has care giver at home    LOS: 1 day  Corey Jolliff, DO 07/13/2011, 11:16 AM

## 2011-07-13 NOTE — Discharge Summary (Signed)
Discharge Summary  [name] MR#: [id number]  DOB:1918/03/31  Date of Admission: 07/12/2011 Date of Discharge: 07/13/2011  Patient's PCP: Rogelia Boga, MD, MD  Attending Physician:Zamari Bonsall   Discharge Diagnoses: Principal Problem:  *Chest pain - reproducible with palpation  HYPERTENSION  CORONARY ARTERY DISEASE  Atrial fibrillation  COPD  Acquired deformity of chest and rib   Brief Admitting History and Physical 89yoM with h/o advanced COPD on home O2, AFib, prior CVA, h/o CAD s/p stents x3 with prior nuclear  showing myocardia scar in septal, inferoseptal, and inferolateral wall, last EF 55-60% in 03/2009  presents with exertional chest pain.  Pt was last admitted to [facility] 12/2010 with orthopnea, SOB, CHF exacerbation. He was diuresed.  Course complicated by bladder outlet obstruction, was Foley'ed with 1200 UOP, this was left in on  d/c. He has also seen his PCP a couple times in Feb for worsening fatigue, gait instability and  falls, for which coreg dose was decreased. On 3/5 visit, was noted to have fallen and with left  sided chest wall pain, had "significant sharp pain with movement and was worried about his heart."  Now pt is moderate historian, can state that he has been doing well recently. He tries to walk  with his walker daily for exercise and to stay healthy, and is usually able to walk 10 minutes  without much dyspnea and no angina. He sometimes walks with his portable O2, sometimes not. Today,  he went to church and came back, ate, tried to nap, couldn't, went for a walk. He pushed himself  farther than he usually goes, and at some indeterminate unclear point developed left sided chest  pain described as sharp, without radiation and no associated dizziness, lightheadedness, nausea,  vomiting. He apparently made it back to the house and rested. The time course is unclear but  possibly overall pain lasted an hour. Pt is unable to elaborate  whether there was a clear pattern  of exertional worsening/relieved with rest. At home he took SL NTG every 5 mins with some relief.  EMS was called, gave him ASA.  In the ED, pt minimally tachy to 100's. Labs with minimal hypoNa 131/hypoCl 95, renal 24/0.8, Trop  POC negative, WBC normal except mild left shift (?) but otherwise normal/stable. CXR wihtout  active disease, but with scoliosis and tortuous aorta and calcifications, cardiomegaly.    Discharge Medications Medication List  As of 07/13/2011  3:52 PM   STOP taking these medications         finasteride 5 MG tablet      ipratropium-albuterol 0.5-2.5 (3) MG/3ML Soln      KLOR-CON 10 10 MEQ tablet      ondansetron 4 MG tablet      polyethylene glycol packet      traMADol 50 MG tablet         TAKE these medications         aspirin 325 MG EC tablet   Take 1 tablet (325 mg total) by mouth daily.      budesonide-formoterol 160-4.5 MCG/ACT inhaler   Commonly known as: SYMBICORT   Inhale 2 puffs into the lungs 2 (two) times daily.      carvedilol 6.25 MG tablet   Commonly known as: COREG   Take 6.25 mg by mouth daily.      celecoxib 200 MG capsule   Commonly known as: CELEBREX   Take 200 mg by mouth daily.      clopidogrel 75  MG tablet   Commonly known as: PLAVIX   Take 75 mg by mouth daily.      digoxin 0.125 MG tablet   Commonly known as: LANOXIN   Take 125 mcg by mouth daily.      diltiazem 120 MG 24 hr capsule   Commonly known as: DILACOR XR   Take 120 mg by mouth daily.      dutasteride 0.5 MG capsule   Commonly known as: AVODART   Take 1 capsule (0.5 mg total) by mouth daily.      fluticasone 50 MCG/ACT nasal spray   Commonly known as: FLONASE   Place 2 sprays into the nose daily.      furosemide 20 MG tablet   Commonly known as: LASIX   Take 1 tablet (20 mg total) by mouth daily.      HYDROcodone-acetaminophen 5-500 MG per tablet   Commonly known as: VICODIN   Take 2 tablets by mouth every 6  (six) hours as needed. For pain.      ipratropium 0.02 % nebulizer solution   Commonly known as: ATROVENT   Take 250 mcg by nebulization 2 (two) times daily.      levothyroxine 50 MCG tablet   Commonly known as: SYNTHROID, LEVOTHROID   Take 50 mcg by mouth daily.      loratadine 10 MG tablet   Commonly known as: CLARITIN   Take 10 mg by mouth daily.      LORazepam 0.5 MG tablet   Commonly known as: ATIVAN   Take 0.5 mg by mouth 2 (two) times daily.      nitroGLYCERIN 0.4 MG SL tablet   Commonly known as: NITROSTAT   Place 0.4 mg under the tongue every 5 (five) minutes as needed. For chest pain.      pantoprazole 40 MG tablet   Commonly known as: PROTONIX   Take 40 mg by mouth daily.      potassium chloride 10 MEQ tablet   Commonly known as: K-DUR   Take 10 mEq by mouth daily.      spironolactone 25 MG tablet   Commonly known as: ALDACTONE   Take 25 mg by mouth 2 (two) times daily.      Tamsulosin HCl 0.4 MG Caps   Commonly known as: FLOMAX   Take 0.4 mg by mouth daily.            Hospital Course: Chest pain - reproducible with palpation, CE negative, no further episodes of chest pain, patient prefers to have "Dr. Kirtland Bouchard" make an outpatient referral to cardiology Physical therapy eval- recommend home health PT   Day of Discharge BP 103/66  Pulse 90  Temp(Src) 98.4 F (36.9 C) (Oral)  Resp 18  Ht 5\' 11"  (1.803 m)  Wt 51.4 kg (113 lb 5.1 oz)  BMI 15.80 kg/m2  SpO2 96%  Results for orders placed during the hospital encounter of 07/12/11 (from the past 48 hour(s))  BASIC METABOLIC PANEL     Status: Abnormal   Collection Time   07/12/11  5:45 PM      Component Value Range Comment   Sodium 131 (*) 135 - 145 (mEq/L)    Potassium 5.0  3.5 - 5.1 (mEq/L)    Chloride 95 (*) 96 - 112 (mEq/L)    CO2 27  19 - 32 (mEq/L)    Glucose, Bld 116 (*) 70 - 99 (mg/dL)    BUN 24 (*) 6 - 23 (mg/dL)    Creatinine,  Ser 0.80  0.50 - 1.35 (mg/dL)    Calcium 9.6  8.4 - 10.5 (mg/dL)      GFR calc non Af Amer 76 (*) >90 (mL/min)    GFR calc Af Amer 88 (*) >90 (mL/min)   CBC     Status: Abnormal   Collection Time   07/12/11  5:45 PM      Component Value Range Comment   WBC 8.7  4.0 - 10.5 (K/uL)    RBC 4.19 (*) 4.22 - 5.81 (MIL/uL)    Hemoglobin 12.1 (*) 13.0 - 17.0 (g/dL)    HCT 16.1 (*) 09.6 - 52.0 (%)    MCV 87.8  78.0 - 100.0 (fL)    MCH 28.9  26.0 - 34.0 (pg)    MCHC 32.9  30.0 - 36.0 (g/dL)    RDW 04.5  40.9 - 81.1 (%)    Platelets 152  150 - 400 (K/uL)   DIFFERENTIAL     Status: Normal   Collection Time   07/12/11  5:45 PM      Component Value Range Comment   Neutrophils Relative 68  43 - 77 (%)    Lymphocytes Relative 21  12 - 46 (%)    Monocytes Relative 9  3 - 12 (%)    Eosinophils Relative 2  0 - 5 (%)    Basophils Relative 0  0 - 1 (%)    Neutro Abs 5.9  1.7 - 7.7 (K/uL)    Lymphs Abs 1.8  0.7 - 4.0 (K/uL)    Monocytes Absolute 0.8  0.1 - 1.0 (K/uL)    Eosinophils Absolute 0.2  0.0 - 0.7 (K/uL)    Basophils Absolute 0.0  0.0 - 0.1 (K/uL)    WBC Morphology MILD LEFT SHIFT (1-5% METAS, OCC MYELO, OCC BANDS)     POCT I-STAT TROPONIN I     Status: Normal   Collection Time   07/12/11  5:52 PM      Component Value Range Comment   Troponin i, poc 0.02  0.00 - 0.08 (ng/mL)    Comment 3            POCT I-STAT TROPONIN I     Status: Normal   Collection Time   07/12/11  9:40 PM      Component Value Range Comment   Troponin i, poc 0.02  0.00 - 0.08 (ng/mL)    Comment 3            CARDIAC PANEL(CRET KIN+CKTOT+MB+TROPI)     Status: Normal   Collection Time   07/12/11 11:29 PM      Component Value Range Comment   Total CK 69  7 - 232 (U/L)    CK, MB 3.2  0.3 - 4.0 (ng/mL)    Troponin I <0.30  <0.30 (ng/mL)    Relative Index RELATIVE INDEX IS INVALID  0.0 - 2.5    CARDIAC PANEL(CRET KIN+CKTOT+MB+TROPI)     Status: Normal   Collection Time   07/13/11  5:30 AM      Component Value Range Comment   Total CK 55  7 - 232 (U/L)    CK, MB 3.0  0.3 - 4.0 (ng/mL)     Troponin I <0.30  <0.30 (ng/mL)    Relative Index RELATIVE INDEX IS INVALID  0.0 - 2.5    BASIC METABOLIC PANEL     Status: Abnormal   Collection Time   07/13/11  5:30 AM      Component Value  Range Comment   Sodium 133 (*) 135 - 145 (mEq/L)    Potassium 4.2  3.5 - 5.1 (mEq/L)    Chloride 97  96 - 112 (mEq/L)    CO2 30  19 - 32 (mEq/L)    Glucose, Bld 89  70 - 99 (mg/dL)    BUN 21  6 - 23 (mg/dL)    Creatinine, Ser 4.09  0.50 - 1.35 (mg/dL)    Calcium 8.9  8.4 - 10.5 (mg/dL)    GFR calc non Af Amer 83 (*) >90 (mL/min)    GFR calc Af Amer >90  >90 (mL/min)   CBC     Status: Abnormal   Collection Time   07/13/11  5:30 AM      Component Value Range Comment   WBC 5.7  4.0 - 10.5 (K/uL)    RBC 4.02 (*) 4.22 - 5.81 (MIL/uL)    Hemoglobin 11.4 (*) 13.0 - 17.0 (g/dL)    HCT 81.1 (*) 91.4 - 52.0 (%)    MCV 87.1  78.0 - 100.0 (fL)    MCH 28.4  26.0 - 34.0 (pg)    MCHC 32.6  30.0 - 36.0 (g/dL)    RDW 78.2  95.6 - 21.3 (%)    Platelets 133 (*) 150 - 400 (K/uL)   CARDIAC PANEL(CRET KIN+CKTOT+MB+TROPI)     Status: Normal   Collection Time   07/13/11  2:16 PM      Component Value Range Comment   Total CK 57  7 - 232 (U/L)    CK, MB 3.1  0.3 - 4.0 (ng/mL)    Troponin I <0.30  <0.30 (ng/mL)    Relative Index RELATIVE INDEX IS INVALID  0.0 - 2.5      Dg Chest Port 1 View  07/12/2011  *RADIOLOGY REPORT*  Clinical Data: Chest pain  PORTABLE CHEST - 1 VIEW  Comparison: 01/25/2011  Findings: Cardiomegaly again noted.  Mild thoracic dextroscoliosis. Tortuous thoracic aorta with atherosclerotic calcifications again noted.  No acute infiltrate or pulmonary edema.  Mild left basilar atelectasis.  IMPRESSION: No active disease.  Mild thoracic dextroscoliosis.  Again noted tortuous thoracic aorta with atherosclerotic calcifications. Cardiomegaly.  Original Report Authenticated By: Natasha Mead, M.D.     Disposition: home with home health  Diet: cardiac  Activity: as tolerated   Follow-up Appts: PCP in  1-2 weeks  Discharge Orders    Future Orders Please Complete By Expires   Diet - low sodium heart healthy      Increase activity slowly         TESTS THAT NEED FOLLOW-UP Referral to cardiologist  Time spent on discharge, talking to the patient, and coordinating care: 45 mins.   SignedMarlin Canary, DO 07/13/2011, 3:52 PM

## 2011-07-13 NOTE — Discharge Instructions (Addendum)
Atrial Fibrillation Your caregiver has diagnosed you with atrial fibrillation (AFib). The heart normally beats very regularly; AFib is a type of irregular heartbeat. The heart rate may be faster or slower than normal. This can prevent your heart from pumping as well as it should. AFib can be constant (chronic) or intermittent (paroxysmal). CAUSES  Atrial fibrillation may be caused by:  Heart disease, including heart attack, coronary artery disease, heart failure, diseases of the heart valves, and others.   Blood clot in the lungs (pulmonary embolism).   Pneumonia or other infections.   Chronic lung disease.   Thyroid disease.   Toxins. These include alcohol, some medications (such as decongestant medications or diet pills), and caffeine.  In some people, no cause for AFib can be found. This is referred to as Lone Atrial Fibrillation. SYMPTOMS   Palpitations or a fluttering in your chest.   A vague sense of chest discomfort.   Shortness of breath.   Sudden onset of lightheadedness or weakness.  Sometimes, the first sign of AFib can be a complication of the condition. This could be a stroke or heart failure. DIAGNOSIS  Your description of your condition may make your caregiver suspicious of atrial fibrillation. Your caregiver will examine your pulse to determine if fibrillation is present. An EKG (electrocardiogram) will confirm the diagnosis. Further testing may help determine what caused you to have atrial fibrillation. This may include chest x-ray, echocardiogram, blood tests, or CT scans. PREVENTION  If you have previously had atrial fibrillation, your caregiver may advise you to avoid substances known to cause the condition (such as stimulant medications, and possibly caffeine or alcohol). You may be advised to use medications to prevent recurrence. Proper treatment of any underlying condition is important to help prevent recurrence. PROGNOSIS  Atrial fibrillation does tend to  become a chronic condition over time. It can cause significant complications (see below). Atrial fibrillation is not usually immediately life-threatening, but it can shorten your life expectancy. This seems to be worse in women. If you have lone atrial fibrillation and are under 60 years old, the risk of complications is very low, and life expectancy is not shortened. RISKS AND COMPLICATIONS  Complications of atrial fibrillation can include stroke, chest pain, and heart failure. Your caregiver will recommend treatments for the atrial fibrillation, as well as for any underlying conditions, to help minimize risk of complications. TREATMENT  Treatment for AFib is divided into several categories:  Treatment of any underlying condition.   Converting you out of AFib into a regular (sinus) rhythm.   Controlling rapid heart rate.   Prevention of blood clots and stroke.  Medications and procedures are available to convert your atrial fibrillation to sinus rhythm. However, recent studies have shown that this may not offer you any advantage, and cardiac experts are continuing research and debate on this topic. More important is controlling your rapid heartbeat. The rapid heartbeat causes more symptoms, and places strain on your heart. Your caregiver will advise you on the use of medications that can control your heart rate. Atrial fibrillation is a strong stroke risk. You can lessen this risk by taking blood thinning medications such as Coumadin (warfarin), or sometimes aspirin. These medications need close monitoring by your caregiver. Over-medication can cause bleeding. Too little medication may not protect against stroke. HOME CARE INSTRUCTIONS   If your caregiver prescribed medicine to make your heartbeat more normally, take as directed.   If blood thinners were prescribed by your caregiver, take EXACTLY as directed.     Perform blood tests EXACTLY as directed.   Quit smoking. Smoking increases your  cardiac and lung (pulmonary) risks.   DO NOT drink alcohol.   DO NOT drink caffeinated drinks (e.g. coffee, soda, chocolate, and leaf teas). You may drink decaffeinated coffee, soda or tea.   If you are overweight, you should choose a reduced calorie diet to lose weight. Please see a registered dietitian if you need more information about healthy weight loss. DO NOT USE DIET PILLS as they may aggravate heart problems.   If you have other heart problems that are causing AFib, you may need to eat a low salt, fat, and cholesterol diet. Your caregiver will tell you if this is necessary.   Exercise every day to improve your physical fitness. Stay active unless advised otherwise.   If your caregiver has given you a follow-up appointment, it is very important to keep that appointment. Not keeping the appointment could result in heart failure or stroke. If there is any problem keeping the appointment, you must call back to this facility for assistance.  SEEK MEDICAL CARE IF:  You notice a change in the rate, rhythm or strength of your heartbeat.   You develop an infection or any other change in your overall health status.  SEEK IMMEDIATE MEDICAL CARE IF:   You develop chest pain, abdominal pain, sweating, weakness or feel sick to your stomach (nausea).   You develop shortness of breath.   You develop swollen feet and ankles.   You develop dizziness, numbness, or weakness of your face or limbs, or any change in vision or speech.  MAKE SURE YOU:   Understand these instructions.   Will watch your condition.   Will get help right away if you are not doing well or get worse.  Document Released: 03/23/2005 Document Revised: 03/12/2011 Document Reviewed: 10/26/2007 [facility] Patient Information 2012 [place], [place].Chest Pain (Nonspecific) It is often hard to give a specific diagnosis for the cause of chest pain. There is always a chance that your pain could be related to something serious, such  as a heart attack or a blood clot in the lungs. You need to follow up with your caregiver for further evaluation. CAUSES   Heartburn.   Pneumonia or bronchitis.   Anxiety or stress.   Inflammation around your heart (pericarditis) or lung (pleuritis or pleurisy).   A blood clot in the lung.   A collapsed lung (pneumothorax). It can develop suddenly on its own (spontaneous pneumothorax) or from injury (trauma) to the chest.   Shingles infection (herpes zoster virus).  The chest wall is composed of bones, muscles, and cartilage. Any of these can be the source of the pain.  The bones can be bruised by injury.   The muscles or cartilage can be strained by coughing or overwork.   The cartilage can be affected by inflammation and become sore (costochondritis).  DIAGNOSIS  Lab tests or other studies, such as X-rays, electrocardiography, stress testing, or cardiac imaging, may be needed to find the cause of your pain.  TREATMENT   Treatment depends on what may be causing your chest pain. Treatment may include:   Acid blockers for heartburn.   Anti-inflammatory medicine.   Pain medicine for inflammatory conditions.   Antibiotics if an infection is present.   You may be advised to change lifestyle habits. This includes stopping smoking and avoiding alcohol, caffeine, and chocolate.   You may be advised to keep your head raised (elevated) when sleeping. This reduces  the chance of acid going backward from your stomach into your esophagus.   Most of the time, nonspecific chest pain will improve within 2 to 3 days with rest and mild pain medicine.  HOME CARE INSTRUCTIONS   If antibiotics were prescribed, take your antibiotics as directed. Finish them even if you start to feel better.   For the next few days, avoid physical activities that bring on chest pain. Continue physical activities as directed.   Do not smoke.   Avoid drinking alcohol.   Only take over-the-counter or  prescription medicine for pain, discomfort, or fever as directed by your caregiver.   Follow your caregiver's suggestions for further testing if your chest pain does not go away.   Keep any follow-up appointments you made. If you do not go to an appointment, you could develop lasting (chronic) problems with pain. If there is any problem keeping an appointment, you must call to reschedule.  SEEK MEDICAL CARE IF:   You think you are having problems from the medicine you are taking. Read your medicine instructions carefully.   Your chest pain does not go away, even after treatment.   You develop a rash with blisters on your chest.  SEEK IMMEDIATE MEDICAL CARE IF:   You have increased chest pain or pain that spreads to your arm, neck, jaw, back, or abdomen.   You develop shortness of breath, an increasing cough, or you are coughing up blood.   You have severe back or abdominal pain, feel nauseous, or vomit.   You develop severe weakness, fainting, or chills.   You have a fever.  THIS IS AN EMERGENCY. Do not wait to see if the pain will go away. Get medical help at once. Call your local emergency services (911 in [place] Do not drive yourself to the hospital. MAKE SURE YOU:   Understand these instructions.   Will watch your condition.   Will get help right away if you are not doing well or get worse.  Document Released: 12/31/2004 Document Revised: 03/12/2011 Document Reviewed: 10/27/2007 [facility] Patient Information 2012 [place], [place].

## 2011-07-13 NOTE — Evaluation (Signed)
Physical Therapy Evaluation Patient Details Name: [name] MRN: [id number] DOB: 27-Oct-1917 Today's Date: 07/13/2011  Problem List:  Patient Active Problem List  Diagnoses  . HYPOTHYROIDISM  . HYPERTENSION  . CORONARY ARTERY DISEASE  . Atrial fibrillation  . CHRONIC SYSTOLIC HEART FAILURE  . STROKE  . PERIPHERAL VASCULAR DISEASE  . CHRONIC OBSTRUCTIVE PULMONARY DISEASE, ACUTE EXACERBATION  . COPD  . GERD  . PEPTIC ULCER DISEASE  . BENIGN PROSTATIC HYPERTROPHY, WITH URINARY OBSTRUCTION  . Osteoarth NOS-Unspec  . SPINAL STENOSIS  . Acquired deformity of chest and rib  . LASSITUDE  . ABNORMALITY OF GAIT  . NAUSEA  . Difficult or painful urination  . Dyspnea  . Urinary retention  . Chest pain on exertion    Past Medical History:  Past Medical History  Diagnosis Date  . Abnormality of gait 09/28/2008  . Acquired deformity of chest and rib 01/14/2009  . Atrial fibrillation 09/30/2006  . BENIGN PROSTATIC HYPERTROPHY, WITH URINARY OBSTRUCTION 09/04/2009  . CHRONIC OBSTRUCTIVE PULMONARY DISEASE, ACUTE EXACERBATION 03/24/2007  . Chronic systolic heart failure 08/06/2008  . COPD 09/30/2006  . CORONARY ARTERY DISEASE 10/01/2006  . GERD 04/19/2007  . HYPERTENSION 09/30/2006  . HYPOTHYROIDISM 09/30/2006  . LASSITUDE 06/13/2007  . Osteoarth NOS-Unspec 09/30/2006  . PEPTIC ULCER DISEASE 09/30/2006  . PERIPHERAL VASCULAR DISEASE 09/30/2006  . SPINAL STENOSIS 08/21/2009  . STROKE 08/28/2008   Past Surgical History:  Past Surgical History  Procedure Date  . Appendectomy   . Hernia repair   . Tonsillectomy   . Abdominal aortic aneurysm repair   . Transurethral resection of prostate   . Lumbar laminectomy     PT Assessment/Plan/Recommendation Clinical Impression Statement: Pt admitted with chest pain with first 2 sets of cardiac enzymes negative. Pt at baseline for mobility (however is still at risk for falling due to LE weakness). Pt has 24 hr caregiver at home and is eager to  have HHPT. Pt very motivated and should do well with continued PT at home. PT Recommendation/Assessment: Patient will need skilled PT in the acute care venue PT Problem List: Decreased strength;Decreased activity tolerance;Decreased mobility;Decreased knowledge of use of DME Barriers to Discharge: None PT Therapy Diagnosis : Difficulty walking PT Frequency: Min 3X/week PT Treatment/Interventions: DME instruction;Gait training;Stair training;Functional mobility training;Therapeutic activities;Balance training;Therapeutic exercise;Patient/family education Follow Up Recommendations: Home health PT;Supervision/Assistance - 24 hour Equipment Recommended: None recommended by PT PT Goals  Acute Rehab PT Goals PT Goal Formulation: With patient Time For Goal Achievement: 7 days Pt will go Supine/Side to Sit: with modified independence;with HOB 0 degrees;with rail PT Goal: Supine/Side to Sit - Progress: Goal set today Pt will go Sit to Supine/Side: with modified independence;with HOB 0 degrees;with rail PT Goal: Sit to Supine/Side - Progress: Goal set today Pt will go Sit to Stand: with modified independence;with upper extremity assist PT Goal: Sit to Stand - Progress: Goal set today Pt will Ambulate: 51 - 150 feet;with supervision;with least restrictive assistive device PT Goal: Ambulate - Progress: Goal set today Pt will Go Up / Down Stairs: 3-5 stairs;with min assist;with rail(s) PT Goal: Up/Down Stairs - Progress: Goal set today Pt will Perform Home Exercise Program: with supervision, verbal cues required/provided PT Goal: Perform Home Exercise Program - Progress: Goal set today  PT Evaluation Precautions/Restrictions  Precautions Precautions: Fall Precaution Comments: Reports recent fall he "just sat down." legs gave out Required Braces or Orthoses: No Restrictions Weight Bearing Restrictions: No Prior Functioning  Home Living Lives With: Spouse;Other (Comment) (hired  caregiver) Dolores Lory Help From: Family;Personal care attendant Type of Home: House Home Layout: One level Home Access: Stairs to enter Entrance Stairs-Rails: Right Entrance Stairs-Number of Steps: 3 Home Adaptive Equipment: Walker - rolling Additional Comments: Reports he walks every day..."I push myself" Prior Function Level of Independence: Requires assistive device for independence;Independent with gait Cognition Cognition Arousal/Alertness: Awake/alert Overall Cognitive Status: Appears within functional limits for tasks assessed Orientation Level: Oriented X4 Sensation/Coordination   Extremity Assessment RLE Assessment RLE Assessment: Exceptions to [facility] RLE AROM (degrees) RLE Overall AROM Comments: WFL RLE Strength RLE Overall Strength Comments: grossly 3+/5 LLE Assessment LLE Assessment: Exceptions to WFL LLE AROM (degrees) LLE Overall AROM Comments: WFL LLE Strength LLE Overall Strength Comments: grossly 3+/5 Mobility (including Balance) Bed Mobility Bed Mobility: Yes Rolling Left: 6: Modified independent (Device/Increase time);With rail Left Sidelying to Sit: 5: Set up;With rails;HOB elevated (comment degrees) Left Sidelying to Sit Details (indicate cue type and reason): HOB 25; set up to remove covers when stuck on his foot Sitting - Scoot to Edge of Bed: 6: Modified independent (Device/Increase time) Transfers Sit to Stand: 5: Supervision;With upper extremity assist;From bed;With armrests;From chair/3-in-1 Sit to Stand Details (indicate cue type and reason): pt started to pull up on handles of RW; with questioning  cue was able to correct himself and push up from surface  Stand to Sit: With upper extremity assist;With armrests;To chair/3-in-1;6: Modified independent (Device/Increase time) Stand to Sit Details: x2 with proper technique each time Ambulation/Gait Ambulation/Gait: Yes Ambulation/Gait Assistance: 4: Min assist Ambulation/Gait Assistance Details  (indicate cue type and reason): minguard assist progressing to min assist as legs fatigued; Lt > Rt knee began buckling with fatigue Ambulation Distance (Feet): 100 Feet Assistive device: Rolling walker Gait Pattern: Step-through pattern;Decreased stride length;Trunk flexed    Exercise    End of Session PT - End of Session Equipment Utilized During Treatment: Gait belt Activity Tolerance: Patient limited by fatigue Patient left: in chair;with call bell in reach;Other (comment) (chair alarm set) Nurse Communication: Mobility status for transfers;Mobility status for ambulation General Behavior During Session: [facility] for tasks performed  Malgorzata Albert 07/13/2011, 2:31 PM Pager [phone]

## 2011-07-13 NOTE — Progress Notes (Signed)
07/13/2011 [facility], Bosie Clos SPARKS Case Management Note [phone]    CARE MANAGEMENT NOTE 07/13/2011  Patient:  [name], [name]   Account Number:  [id number]  Date Initiated:  07/13/2011  Documentation initiated by:  Fransico Michael  Subjective/Objective Assessment:   admitted on 07/12/11 with c/o chest pain     Action/Plan:   prior to admission, patient lived at home with spouse. Reports having a family friend that acts as caretaker for patient and wife. Uses [employer] for home O2. Uses walker, cane, and shower chair.   Anticipated DC Date:  07/16/2011   Anticipated DC Plan:  HOME W HOME HEALTH SERVICES      DC Planning Services  CM consult      [facility] Choice  HOME HEALTH   Choice offered to / List presented to:  C-1 Patient        HH arranged  HH-2 PT      [facility] agency  [facility]   Status of service:  Completed, signed off Medicare Important Message given?   (If response is "NO", the following Medicare IM given date fields will be blank) Date Medicare IM given:   Date Additional Medicare IM given:    Discharge Disposition:  HOME W HOME HEALTH SERVICES  Per UR Regulation:  Reviewed for med. necessity/level of care/duration of stay  If discussed at Long Length of Stay Meetings, dates discussed:    Comments:  PCP: Rogelia Boga  ContactEpimenio Foot [phone]  07/13/11-1627-J.Jacynda Brunke,RN,BSN [phone]      Demographics, H&P, orders for home health, and face to face faxed to central intake for [facility]. Faxed to [phone].   07/13/11-1614-J.Zaeda Mcferran,RN,BSN  [phone]      In to speak with patient regarding recommendations for home health PT. Choice of home health agencies offered to patient who reports having used [place] in past. Marc Morgans again. Referral made.

## 2011-07-16 ENCOUNTER — Telehealth: Payer: Self-pay | Admitting: *Deleted

## 2011-07-16 NOTE — Telephone Encounter (Signed)
Attempt to call- number given - recording on ans mach does not indicate [facility] or rosie - no message left -but we have rec'vd O2 information

## 2011-07-16 NOTE — Telephone Encounter (Signed)
Rosie needs to know if Dr. Kirtland Bouchard received the certification for 02.  Please call.

## 2011-07-20 ENCOUNTER — Other Ambulatory Visit: Payer: Self-pay | Admitting: Internal Medicine

## 2011-07-20 NOTE — Telephone Encounter (Signed)
You gave him # [phone] in Oct 2012 Please advise

## 2011-07-20 NOTE — Telephone Encounter (Signed)
OK #[phone]  RF [phone]

## 2011-07-23 ENCOUNTER — Ambulatory Visit: Payer: Self-pay | Admitting: Internal Medicine

## 2011-07-24 ENCOUNTER — Encounter: Payer: Self-pay | Admitting: Internal Medicine

## 2011-07-24 ENCOUNTER — Ambulatory Visit (INDEPENDENT_AMBULATORY_CARE_PROVIDER_SITE_OTHER): Payer: Medicare Other | Admitting: Internal Medicine

## 2011-07-24 VITALS — BP 90/60 | Temp 97.5°F | Wt 120.0 lb

## 2011-07-24 DIAGNOSIS — J449 Chronic obstructive pulmonary disease, unspecified: Secondary | ICD-10-CM

## 2011-07-24 DIAGNOSIS — I4891 Unspecified atrial fibrillation: Secondary | ICD-10-CM

## 2011-07-24 DIAGNOSIS — I5022 Chronic systolic (congestive) heart failure: Secondary | ICD-10-CM

## 2011-07-24 DIAGNOSIS — R269 Unspecified abnormalities of gait and mobility: Secondary | ICD-10-CM

## 2011-07-24 NOTE — Patient Instructions (Signed)
Limit your sodium (Salt) intake  Return in one month for follow-up 

## 2011-07-24 NOTE — Progress Notes (Signed)
  Subjective:    Patient ID: [name], male    DOB: [birth year], 78 y.o.   MRN: [id number]  HPI  78 year old patient who is seen today for followup. He has had a recent brief hospitalization and was reviewed he has a history of advanced COPD oxygen dependent as well as chronic systolic heart failure. Since his discharge he has done quite well. He has a history of permanent atrial fibrillation but did to the gait instability has been on Plavix only. His cardio pulmonary status has been remarkably stable since his discharge. Hospital records reviewed   Review of Systems  Constitutional: Positive for fatigue. Negative for fever, chills and appetite change.  HENT: Negative for hearing loss, ear pain, congestion, sore throat, trouble swallowing, neck stiffness, dental problem, voice change and tinnitus.   Eyes: Negative for pain, discharge and visual disturbance.  Respiratory: Negative for cough, chest tightness, wheezing and stridor.   Cardiovascular: Negative for chest pain, palpitations and leg swelling.  Gastrointestinal: Negative for nausea, vomiting, abdominal pain, diarrhea, constipation, blood in stool and abdominal distention.  Genitourinary: Negative for urgency, hematuria, flank pain, discharge, difficulty urinating and genital sores.  Musculoskeletal: Positive for back pain and gait problem. Negative for myalgias, joint swelling and arthralgias.  Skin: Negative for rash.  Neurological: Positive for weakness. Negative for dizziness, syncope, speech difficulty, numbness and headaches.  Hematological: Negative for adenopathy. Does not bruise/bleed easily.  Psychiatric/Behavioral: Negative for behavioral problems and dysphoric mood. The patient is not nervous/anxious.        Objective:   Physical Exam  Constitutional: He is oriented to person, place, and time. He appears well-developed.       Chronically ill Nasal oxygen in place Blood pressure 90/60  HENT:  Head:  Normocephalic.  Right Ear: External ear normal.  Left Ear: External ear normal.  Eyes: Conjunctivae and EOM are normal.  Neck: Normal range of motion.  Cardiovascular: Normal rate and normal heart sounds.        Controlled ventricular response  Pulmonary/Chest: Effort normal and breath sounds normal.  Abdominal: Bowel sounds are normal.  Musculoskeletal: Normal range of motion. He exhibits no edema and no tenderness.       No peripheral edema  Neurological: He is alert and oriented to person, place, and time.  Psychiatric: He has a normal mood and affect. His behavior is normal.          Assessment & Plan:   Advanced COPD Chronic systolic heart failure compensated Chronic atrial fibrillation Hypertension  Medications discussed and reviewed. Furosemide will be decreased to daily as needed for swelling. He has not taken his medication since his hospital discharge and his iron status has been stable. Diltiazem has been discontinued. We'll maintain Coreg for blood pressure and rate control

## 2011-07-31 ENCOUNTER — Other Ambulatory Visit (INDEPENDENT_AMBULATORY_CARE_PROVIDER_SITE_OTHER): Payer: Medicare Other

## 2011-07-31 DIAGNOSIS — R339 Retention of urine, unspecified: Secondary | ICD-10-CM

## 2011-07-31 LAB — POCT URINALYSIS DIPSTICK
Glucose, UA: NEGATIVE
Leukocytes, UA: NEGATIVE
Nitrite, UA: NEGATIVE
Spec Grav, UA: 1.015
Urobilinogen, UA: 0.2

## 2011-08-05 HISTORY — PX: TRANSTHORACIC ECHOCARDIOGRAM: SHX275

## 2011-08-10 ENCOUNTER — Other Ambulatory Visit: Payer: Self-pay

## 2011-08-10 NOTE — Telephone Encounter (Signed)
Fax refill request from [facility] for vicodin  Last written 3.21.13 # [phone] Please advise

## 2011-08-11 MED ORDER — HYDROCODONE-ACETAMINOPHEN 5-500 MG PO TABS: 2.0000 | ORAL_TABLET | Freq: Four times a day (QID) | ORAL | Status: DC | PRN

## 2011-08-11 NOTE — Telephone Encounter (Signed)
Ok

## 2011-08-11 NOTE — Telephone Encounter (Signed)
Called in.

## 2011-08-19 ENCOUNTER — Encounter: Payer: Self-pay | Admitting: Internal Medicine

## 2011-08-21 ENCOUNTER — Encounter: Payer: Self-pay | Admitting: Internal Medicine

## 2011-08-21 ENCOUNTER — Ambulatory Visit (INDEPENDENT_AMBULATORY_CARE_PROVIDER_SITE_OTHER): Payer: Medicare Other | Admitting: Internal Medicine

## 2011-08-21 VITALS — BP 110/60 | Wt 118.0 lb

## 2011-08-21 DIAGNOSIS — I2510 Atherosclerotic heart disease of native coronary artery without angina pectoris: Secondary | ICD-10-CM

## 2011-08-21 DIAGNOSIS — I4891 Unspecified atrial fibrillation: Secondary | ICD-10-CM

## 2011-08-21 DIAGNOSIS — I5022 Chronic systolic (congestive) heart failure: Secondary | ICD-10-CM

## 2011-08-21 DIAGNOSIS — I10 Essential (primary) hypertension: Secondary | ICD-10-CM

## 2011-08-21 NOTE — Progress Notes (Signed)
  Subjective:    Patient ID: [name], male    DOB: Aug 03, 1917, 78 y.o.   MRN: [id number]  HPI  78 year old patient who is seen today in followup. He has history of coronary artery disease and chronic systolic heart failure. He has chronic atrial fibrillation and advanced COPD. Last visit diltiazem and furosemide discontinued. He has done well without any recurrent peripheral edema. He does complain of just a general sense of unwellness with mainly skeletal pain. His left elbow is particularly uncomfortable. He has a prior fracture to the left elbow and 2009. His cardiopulmonary status seems fairly stable there's been no peripheral edema since discontinuation of furosemide remains on Aldactone    Review of Systems  Constitutional: Positive for fatigue. Negative for fever, chills and appetite change.  HENT: Negative for hearing loss, ear pain, congestion, sore throat, trouble swallowing, neck stiffness, dental problem, voice change and tinnitus.   Eyes: Negative for pain, discharge and visual disturbance.  Respiratory: Negative for cough, chest tightness, wheezing and stridor.   Cardiovascular: Negative for chest pain, palpitations and leg swelling.  Gastrointestinal: Negative for nausea, vomiting, abdominal pain, diarrhea, constipation, blood in stool and abdominal distention.  Genitourinary: Negative for urgency, hematuria, flank pain, discharge, difficulty urinating and genital sores.  Musculoskeletal: Positive for arthralgias and gait problem. Negative for myalgias, back pain and joint swelling.  Skin: Negative for rash.  Neurological: Positive for weakness. Negative for dizziness, syncope, speech difficulty, numbness and headaches.  Hematological: Negative for adenopathy. Does not bruise/bleed easily.  Psychiatric/Behavioral: Negative for behavioral problems and dysphoric mood. The patient is not nervous/anxious.        Objective:   Physical Exam  Constitutional: He is  oriented to person, place, and time. He appears well-developed.  HENT:  Head: Normocephalic.  Right Ear: External ear normal.  Left Ear: External ear normal.  Eyes: Conjunctivae and EOM are normal.  Neck: Normal range of motion.  Cardiovascular: Normal rate and normal heart sounds.   Pulmonary/Chest: Effort normal and breath sounds normal. No respiratory distress. He has no wheezes.  Abdominal: Bowel sounds are normal.  Musculoskeletal: Normal range of motion. He exhibits no edema and no tenderness.  Neurological: He is alert and oriented to person, place, and time.  Psychiatric: He has a normal mood and affect. His behavior is normal.          Assessment & Plan:   Chronic systolic heart failure. Appears well compensated Osteoarthritis Atrial fibrillation COPD. Oxygen dependent  Medical regimen unchanged. Low-salt diet recommended Samples provided. Recheck 3 months

## 2011-08-21 NOTE — Patient Instructions (Signed)
Limit your sodium (Salt) intake  Return in 3 months for follow-up   

## 2011-08-27 ENCOUNTER — Telehealth: Payer: Self-pay

## 2011-08-27 NOTE — Telephone Encounter (Signed)
Pt called and states he wants Selena Batten to return his call about Plavix.  Pt states due to cost he wants to know if it comes in a generic.  Pls advise.

## 2011-08-28 NOTE — Telephone Encounter (Signed)
Spoke with pt- told plavix  Does come in generic - when you order RF - request generic , also c/o stomach pain - instructed to take protonix . Call if no inprovement

## 2011-09-01 ENCOUNTER — Telehealth: Payer: Self-pay | Admitting: Internal Medicine

## 2011-09-01 NOTE — Telephone Encounter (Signed)
Please call/advise

## 2011-09-01 NOTE — Telephone Encounter (Signed)
(  triage voicemail) Pt wants to know if there is something he can cut back on so that he is not so sleepy all the time.  Next month he has to go on coumadin because the generic plavix is $139 monthly.  Please advise what to do.

## 2011-09-01 NOTE — Telephone Encounter (Signed)
Spoke with pt- informed of dr. Amador Cunas instructions

## 2011-09-01 NOTE — Telephone Encounter (Signed)
Pain medication(hydrocodone) and lorazepam are the only medications that may be sedating. Patient may discontinue or take only when absolutely needed

## 2011-09-11 ENCOUNTER — Telehealth: Payer: Self-pay | Admitting: Gastroenterology

## 2011-09-11 NOTE — Telephone Encounter (Signed)
Pt has been very constipated and wanted to know if Dr. Kirtland Bouchard could send something in for him. I suggested Miralax daily but told him to let me check with Dr. Sharrie Rothman first.

## 2011-09-14 NOTE — Telephone Encounter (Signed)
Spoke with pt- he states everything ok now.

## 2011-09-18 ENCOUNTER — Ambulatory Visit (INDEPENDENT_AMBULATORY_CARE_PROVIDER_SITE_OTHER): Payer: Medicare Other | Admitting: Internal Medicine

## 2011-09-18 ENCOUNTER — Other Ambulatory Visit: Payer: Self-pay | Admitting: Internal Medicine

## 2011-09-18 ENCOUNTER — Encounter: Payer: Self-pay | Admitting: Internal Medicine

## 2011-09-18 VITALS — BP 100/60 | Temp 97.8°F | Wt 118.0 lb

## 2011-09-18 DIAGNOSIS — J449 Chronic obstructive pulmonary disease, unspecified: Secondary | ICD-10-CM

## 2011-09-18 DIAGNOSIS — R269 Unspecified abnormalities of gait and mobility: Secondary | ICD-10-CM

## 2011-09-18 DIAGNOSIS — I6350 Cerebral infarction due to unspecified occlusion or stenosis of unspecified cerebral artery: Secondary | ICD-10-CM

## 2011-09-18 DIAGNOSIS — I10 Essential (primary) hypertension: Secondary | ICD-10-CM

## 2011-09-18 DIAGNOSIS — M4800 Spinal stenosis, site unspecified: Secondary | ICD-10-CM

## 2011-09-18 DIAGNOSIS — R110 Nausea: Secondary | ICD-10-CM

## 2011-09-18 NOTE — Patient Instructions (Signed)
Maalox plus use 3 times daily  Call or return to clinic prn if these symptoms worsen or fail to improve as anticipated.

## 2011-09-18 NOTE — Progress Notes (Signed)
  Subjective:    Patient ID: [name], male    DOB: 1917-07-30, 78 y.o.   MRN: [id number]  HPI  78 year old patient who is seen today with 2 concerns. He has had some nausea and  excess gaseousness. This has been a chronic problem in the past.  It has been well-controlled with Zofran. He also requests referral for physical therapy services He has advanced COPD. He is objecting  to the cost of Plavix. He has chronic atrial fibrillation.    Review of Systems  Constitutional: Negative for fever, chills, appetite change and fatigue.  HENT: Negative for hearing loss, ear pain, congestion, sore throat, trouble swallowing, neck stiffness, dental problem, voice change and tinnitus.   Eyes: Negative for pain, discharge and visual disturbance.  Respiratory: Negative for cough, chest tightness, wheezing and stridor.   Cardiovascular: Negative for chest pain, palpitations and leg swelling.  Gastrointestinal: Positive for nausea. Negative for vomiting, abdominal pain, diarrhea, constipation, blood in stool and abdominal distention.  Genitourinary: Negative for urgency, hematuria, flank pain, discharge, difficulty urinating and genital sores.  Musculoskeletal: Negative for myalgias, back pain, joint swelling, arthralgias and gait problem.  Skin: Negative for rash.  Neurological: Negative for dizziness, syncope, speech difficulty, weakness, numbness and headaches.  Hematological: Negative for adenopathy. Does not bruise/bleed easily.  Psychiatric/Behavioral: Negative for behavioral problems and dysphoric mood. The patient is not nervous/anxious.        Objective:   Physical Exam  Constitutional: He is oriented to person, place, and time. He appears well-developed.       Frail no acute distress  HENT:  Head: Normocephalic.  Right Ear: External ear normal.  Left Ear: External ear normal.  Eyes: Conjunctivae and EOM are normal.  Neck: Normal range of motion.  Cardiovascular: Normal rate  and normal heart sounds.        Irregular rhythm with a controlled ventricular response  Pulmonary/Chest: Breath sounds normal.  Abdominal: Bowel sounds are normal. He exhibits no distension. There is no tenderness. There is no rebound.  Musculoskeletal: Normal range of motion. He exhibits no edema and no tenderness.  Neurological: He is alert and oriented to person, place, and time.  Psychiatric: He has a normal mood and affect. His behavior is normal.          Assessment & Plan:  Recurrent nausea with gaseousness. We'll continue PPI and placed on Maalox plus twice daily. He will also continue his anti-nausea medication COPD Chronic atrial fibrillation continue Plavix. Other options discussed

## 2011-09-28 ENCOUNTER — Other Ambulatory Visit: Payer: Self-pay | Admitting: Internal Medicine

## 2011-09-30 ENCOUNTER — Other Ambulatory Visit: Payer: Self-pay | Admitting: Internal Medicine

## 2011-10-01 ENCOUNTER — Other Ambulatory Visit: Payer: Self-pay | Admitting: Internal Medicine

## 2011-10-01 NOTE — Telephone Encounter (Signed)
Pt needs refill on nausea med today

## 2011-10-05 ENCOUNTER — Telehealth: Payer: Self-pay | Admitting: Internal Medicine

## 2011-10-05 NOTE — Telephone Encounter (Signed)
please advise/FYI

## 2011-10-05 NOTE — Telephone Encounter (Signed)
Caller: Vitaly/Patient; PCP: Eleonore Chiquito; CB#: [phone]; ; ; Call regarding Abdominal Pain with indigestion and Nausea, onset 1 mth.  Pt requesting lab or scan performed.  Pt states, "I was seen 1.5 weeks for same sxs, no treatment given." Afebrile. Pt refuses ED.  Heartburn and Abdominal Pain Protocols used.  Pt requesting appt w/ Dr Kirtland Bouchard, no appts remaining on 7-1, appt scheduled on 7-2.  Advised Pt to be seen at ED if sxs worsen.  Pt verbalized understanding.

## 2011-10-06 ENCOUNTER — Encounter: Payer: Self-pay | Admitting: Internal Medicine

## 2011-10-06 ENCOUNTER — Ambulatory Visit (INDEPENDENT_AMBULATORY_CARE_PROVIDER_SITE_OTHER): Payer: Medicare Other | Admitting: Internal Medicine

## 2011-10-06 ENCOUNTER — Telehealth: Payer: Self-pay | Admitting: Internal Medicine

## 2011-10-06 VITALS — BP 110/70 | Temp 97.4°F

## 2011-10-06 DIAGNOSIS — R5383 Other fatigue: Secondary | ICD-10-CM

## 2011-10-06 DIAGNOSIS — J449 Chronic obstructive pulmonary disease, unspecified: Secondary | ICD-10-CM

## 2011-10-06 DIAGNOSIS — I2510 Atherosclerotic heart disease of native coronary artery without angina pectoris: Secondary | ICD-10-CM

## 2011-10-06 DIAGNOSIS — R110 Nausea: Secondary | ICD-10-CM

## 2011-10-06 DIAGNOSIS — N401 Enlarged prostate with lower urinary tract symptoms: Secondary | ICD-10-CM

## 2011-10-06 DIAGNOSIS — K219 Gastro-esophageal reflux disease without esophagitis: Secondary | ICD-10-CM

## 2011-10-06 DIAGNOSIS — R5381 Other malaise: Secondary | ICD-10-CM

## 2011-10-06 DIAGNOSIS — I5022 Chronic systolic (congestive) heart failure: Secondary | ICD-10-CM

## 2011-10-06 DIAGNOSIS — J441 Chronic obstructive pulmonary disease with (acute) exacerbation: Secondary | ICD-10-CM

## 2011-10-06 LAB — CBC WITH DIFFERENTIAL/PLATELET
Basophils Absolute: 0 10*3/uL (ref 0.0–0.1)
Eosinophils Relative: 1.8 % (ref 0.0–5.0)
Lymphocytes Relative: 18.9 % (ref 12.0–46.0)
Monocytes Relative: 10.7 % (ref 3.0–12.0)
Neutrophils Relative %: 68.1 % (ref 43.0–77.0)
Platelets: 182 10*3/uL (ref 150.0–400.0)
RDW: 13.4 % (ref 11.5–14.6)
WBC: 5.7 10*3/uL (ref 4.5–10.5)

## 2011-10-06 LAB — COMPREHENSIVE METABOLIC PANEL
ALT: 14 U/L (ref 0–53)
Albumin: 3.3 g/dL — ABNORMAL LOW (ref 3.5–5.2)
CO2: 31 mEq/L (ref 19–32)
GFR: 118.02 mL/min (ref >60.00)
Glucose, Bld: 78 mg/dL (ref 70–99)
Potassium: 5.1 mEq/L (ref 3.5–5.1)
Sodium: 129 mEq/L — ABNORMAL LOW (ref 135–145)
Total Protein: 6.6 g/dL (ref 6.0–8.3)

## 2011-10-06 MED ORDER — CARVEDILOL 6.25 MG PO TABS: ORAL_TABLET | ORAL | Status: DC

## 2011-10-06 MED ORDER — METOCLOPRAMIDE HCL 5 MG PO TABS: ORAL_TABLET | ORAL | Status: DC

## 2011-10-06 NOTE — Progress Notes (Signed)
Subjective:    Patient ID: [name], male    DOB: 08-07-1917, 78 y.o.   MRN: [id number]  HPI 78 year old patient who has multiple medical problems including chronic systolic heart failure and advanced oxygen-dependent COPD. He has a history of chronic nausea but this has worsened recently and has been refractory to treatment with Zofran that has been helpful in the past. He has a history of GERD and does have chronic reflux and belching. He states his appetite has been well maintained and there is been no weight loss. Medical treatment includes Protonix once daily. Medical regimen also includes Celebrex and Vicodin which he does not take on a regular basis. He is also on Lanoxin 0.125 mg daily.  Past Medical History  Diagnosis Date  . Abnormality of gait 09/28/2008  . Acquired deformity of chest and rib 01/14/2009  . Atrial fibrillation 09/30/2006  . BENIGN PROSTATIC HYPERTROPHY, WITH URINARY OBSTRUCTION 09/04/2009  . CHRONIC OBSTRUCTIVE PULMONARY DISEASE, ACUTE EXACERBATION 03/24/2007  . Chronic systolic heart failure 08/06/2008  . COPD 09/30/2006  . CORONARY ARTERY DISEASE 10/01/2006  . GERD 04/19/2007  . HYPERTENSION 09/30/2006  . HYPOTHYROIDISM 09/30/2006  . LASSITUDE 06/13/2007  . Osteoarth NOS-Unspec 09/30/2006  . PEPTIC ULCER DISEASE 09/30/2006  . PERIPHERAL VASCULAR DISEASE 09/30/2006  . SPINAL STENOSIS 08/21/2009  . STROKE 08/28/2008    History   Social History  . Marital Status: Married    Spouse Name: N/A    Number of Children: N/A  . Years of Education: N/A   Occupational History  . 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 Smoking status: Former Smoker -- 0.8 packs/day for 20 years    Types: Cigarettes    Quit date: 04/06/1948  . Smokeless tobacco: Never Used  . Alcohol Use: No  . Drug Use: No  . Sexually Active: Not on file   Other Topics Concern  . Not on file   Social History Narrative   Lives at home with wife  and has caretaker, still ambulatory daily with a walker. Had 2 children, 1 passed away.      Past Surgical History  Procedure Date  . Appendectomy   . Hernia repair   . Tonsillectomy   . Abdominal aortic aneurysm repair   . Transurethral resection of prostate   . Lumbar laminectomy     No family history on file.  Allergies  Allergen Reactions  . Bleph-10 (Sulfacetamide Sodium)   . Procaine Hcl     REACTION: syncope    Current Outpatient Prescriptions on File Prior to Visit  Medication Sig Dispense Refill  . budesonide-formoterol (SYMBICORT) 160-4.5 MCG/ACT inhaler Inhale 2 puffs into the lungs 2 (two) times daily.  1 Inhaler  12  . carvedilol (COREG) 6.25 MG tablet Take 6.25 mg by mouth daily.      . celecoxib (CELEBREX) 200 MG capsule Take 200 mg by mouth daily.      . clopidogrel (PLAVIX) 75 MG tablet Take 75 mg by mouth daily.      . digoxin (LANOXIN) 0.125 MG tablet Take 125 mcg by mouth daily.      Marland Kitchen dutasteride (AVODART) 0.5 MG capsule Take 1 capsule (0.5 mg total) by mouth daily.  50 capsule  3  . fluticasone (FLONASE) 50 MCG/ACT nasal spray Place 2 sprays into the nose daily.  1 g  0  . furosemide (LASIX) 20 MG tablet Take 1 tablet (20 mg total) by  mouth daily.  90 tablet  3  . HYDROcodone-acetaminophen (VICODIN) 5-500 MG per tablet Take 2 tablets by mouth every 6 (six) hours as needed. For pain.  50 tablet  3  . ipratropium (ATROVENT) 0.02 % nebulizer solution Take 250 mcg by nebulization 2 (two) times daily.      Marland Kitchen levothyroxine (SYNTHROID, LEVOTHROID) 50 MCG tablet Take 50 mcg by mouth daily.      Marland Kitchen loratadine (CLARITIN) 10 MG tablet Take 10 mg by mouth daily.      Marland Kitchen LORazepam (ATIVAN) 0.5 MG tablet Take 0.5 mg by mouth 2 (two) times daily.      . nitroGLYCERIN (NITROSTAT) 0.4 MG SL tablet Place 0.4 mg under the tongue every 5 (five) minutes as needed. For chest pain.      Marland Kitchen ondansetron (ZOFRAN) 4 MG tablet TAKE 1 TABLET BY MOUTH EVERY 6 HOURS AS NEEDED  30 tablet  0    . pantoprazole (PROTONIX) 40 MG tablet Take 40 mg by mouth daily.      Marland Kitchen spironolactone (ALDACTONE) 25 MG tablet Take 25 mg by mouth 2 (two) times daily.      . Tamsulosin HCl (FLOMAX) 0.4 MG CAPS Take 0.4 mg by mouth daily.        BP 110/70  Temp 97.4 F (36.3 C) (Oral)         Review of Systems  Constitutional: Negative for fever, chills, appetite change and fatigue.  HENT: Negative for hearing loss, ear pain, congestion, sore throat, trouble swallowing, neck stiffness, dental problem, voice change and tinnitus.   Eyes: Negative for pain, discharge and visual disturbance.  Respiratory: Negative for cough, chest tightness, wheezing and stridor.   Cardiovascular: Negative for chest pain, palpitations and leg swelling.  Gastrointestinal: Positive for nausea. Negative for vomiting, abdominal pain, diarrhea, constipation, blood in stool and abdominal distention.  Genitourinary: Negative for urgency, hematuria, flank pain, discharge, difficulty urinating and genital sores.  Musculoskeletal: Negative for myalgias, back pain, joint swelling, arthralgias and gait problem.  Skin: Negative for rash.  Neurological: Negative for dizziness, syncope, speech difficulty, weakness, numbness and headaches.  Hematological: Negative for adenopathy. Does not bruise/bleed easily.  Psychiatric/Behavioral: Negative for behavioral problems and dysphoric mood. The patient is not nervous/anxious.        Objective:   Physical Exam  Constitutional: He is oriented to person, place, and time. He appears well-developed.       Elderly frail no acute distress Blood pressure 80/60  HENT:  Head: Normocephalic.  Right Ear: External ear normal.  Left Ear: External ear normal.  Eyes: Conjunctivae and EOM are normal.  Neck: Normal range of motion.  Cardiovascular: Normal rate and normal heart sounds.        Irregular rhythm with controlled ventricular response  Pulmonary/Chest: Effort normal. No respiratory  distress. He has no wheezes. He has no rales.       Decreased breath sounds but clear. Nasal cannula oxygen in place  Abdominal: Bowel sounds are normal.  Musculoskeletal: Normal range of motion. He exhibits no edema and no tenderness.  Neurological: He is alert and oriented to person, place, and time.  Psychiatric: He has a normal mood and affect. His behavior is normal.          Assessment & Plan:  Nausea. We'll check a digoxin level and decrease Lanoxin to 0.0 65 mg daily. We'll treat GERD more aggressively. We'll increase Protonix to a twice a day regimen short-term. We'll also give a short trial of  Reglan 5 mg 3 times a day for 10 days. We'll treat with Gaviscon also 3 times a day Hypotension we'll decrease 482 one half tablet twice daily. We'll place furosemide on hold and check electrolytes

## 2011-10-06 NOTE — Telephone Encounter (Signed)
Have patient follow detailed dietary instructions provided at [facility]

## 2011-10-06 NOTE — Patient Instructions (Signed)
Avoids foods high in acid such as tomatoes citrus juices, and spicy foods.  Avoid eating within two hours of lying down or before exercising.  Do not overheat.  Try smaller more frequent meals.  If symptoms persist, elevate the head of her bed 12 inches while sleeping.  Decrease Lanoxin to one half tablet daily Decrease Coreg to one half tablet twice daily  Hold furosemide Celebrex Vicodin  Gaviscon 3 times daily  Metoclopramide 3 times daily prior to meals  Diet for GERD or PUD Nutrition therapy can help ease the discomfort of gastroesophageal reflux disease (GERD) and peptic ulcer disease (PUD).   HOME CARE INSTRUCTIONS    Eat your meals slowly, in a relaxed setting.   Eat 5 to 6 small meals per day.   If a food causes distress, stop eating it for a period of time.  FOODS TO AVOID  Coffee, regular or decaffeinated.   Cola beverages, regular or low calorie.   Tea, regular or decaffeinated.   Pepper.   Cocoa.   High fat foods, including meats.   Butter, margarine, hydrogenated oil (trans fats).   Peppermint or spearmint (if you have GERD).   Fruits and vegetables if not tolerated.   Alcohol.   Nicotine (smoking or chewing). This is one of the most potent stimulants to acid production in the gastrointestinal tract.   Any food that seems to aggravate your condition.  If you have questions regarding your diet, ask your caregiver or a registered dietitian. TIPS  Lying flat may make symptoms worse. Keep the head of your bed raised 6 to 9 inches (15 to 23 cm) by using a foam wedge or blocks under the legs of the bed.   Do not lay down until 3 hours after eating a meal.   Daily physical activity may help reduce symptoms.  MAKE SURE YOU:    Understand these instructions.   Will watch your condition.   Will get help right away if you are not doing well or get worse.  Document Released: 03/23/2005 Document Revised: 03/12/2011 Document Reviewed: 02/06/2011 [facility]  Patient Information 2012 [place], [place].

## 2011-10-06 NOTE — Telephone Encounter (Signed)
Please advise 

## 2011-10-06 NOTE — Telephone Encounter (Signed)
Pt called said that Dr Amador Cunas put pt on special diet and told pt what he couldn't eat, but pt needs to know what he can eat. Pls call.

## 2011-10-07 ENCOUNTER — Other Ambulatory Visit: Payer: Self-pay | Admitting: Internal Medicine

## 2011-10-07 LAB — DIGOXIN LEVEL: Digoxin Level: 0.4 ng/mL — ABNORMAL LOW (ref 0.8–2.0)

## 2011-10-07 NOTE — Telephone Encounter (Signed)
Attempt to call - VM - LMTCB if questions - got print out for GERD /PUD yesterday - went over list - call if questions

## 2011-10-09 NOTE — Telephone Encounter (Signed)
Pt is requesting kim to return his call. Pt was transferred to [facility]

## 2011-10-09 NOTE — Telephone Encounter (Signed)
Continue same treatment; discontinue Reglan

## 2011-10-09 NOTE — Telephone Encounter (Signed)
Pt was seen in office earlier this week- started on reglan - states it makes him sick. Stopped today and feeling alittle better - anything to take in its place?  Please advise

## 2011-10-12 NOTE — Telephone Encounter (Signed)
Pt aware.

## 2011-10-13 ENCOUNTER — Other Ambulatory Visit: Payer: Self-pay

## 2011-10-13 MED ORDER — HYDROCODONE-ACETAMINOPHEN 5-500 MG PO TABS: 2.0000 | ORAL_TABLET | Freq: Four times a day (QID) | ORAL | Status: DC | PRN

## 2011-10-13 NOTE — Telephone Encounter (Signed)
Called in.

## 2011-10-13 NOTE — Telephone Encounter (Signed)
ok 

## 2011-10-13 NOTE — Telephone Encounter (Signed)
Last written 08/11/11 # 50 3rf PLEASE ADVISE

## 2011-10-28 ENCOUNTER — Other Ambulatory Visit: Payer: Self-pay | Admitting: Internal Medicine

## 2011-10-30 ENCOUNTER — Encounter: Payer: Self-pay | Admitting: Family Medicine

## 2011-10-30 ENCOUNTER — Ambulatory Visit (INDEPENDENT_AMBULATORY_CARE_PROVIDER_SITE_OTHER): Payer: Medicare Other | Admitting: Family Medicine

## 2011-10-30 ENCOUNTER — Telehealth: Payer: Self-pay | Admitting: Internal Medicine

## 2011-10-30 VITALS — BP 100/60 | HR 77 | Temp 98.8°F

## 2011-10-30 DIAGNOSIS — R197 Diarrhea, unspecified: Secondary | ICD-10-CM

## 2011-10-30 NOTE — Progress Notes (Signed)
  Subjective:    Patient ID: [name], male    DOB: 06-May-1917, 78 y.o.   MRN: [id number]  HPI Here with his family for diarrhea that started last night. He has chronic constipation and takes 2 Dulcolax tablets every day. Then last night around 10 pm he also used a Dulcolax suppository. About one hour later he started having watery diarrhea, and he has been having this ever since. No fever or abdominal pain. He has mild nausea without vomting, but he has dealt with chronic nausea for years. He is drinking plenty of fluids today. He took one Imodium AD at 4 am today and a second one at 10 am this morning, with no improvement.   Review of Systems  Respiratory: Negative.   Cardiovascular: Negative.   Gastrointestinal: Positive for nausea and diarrhea. Negative for vomiting, abdominal pain, constipation, blood in stool, abdominal distention and rectal pain.  Genitourinary: Negative.   Neurological: Positive for weakness.       Objective:   Physical Exam  Constitutional:       Alert but appears weak, in a wheelchair  HENT:       Mucus membranes are moist   Abdominal: Soft. Bowel sounds are normal. He exhibits no distension and no mass. There is no tenderness. There is no rebound and no guarding.          Assessment & Plan:  His diarrhea seems to be a simple side effect of the laxatives he took. Advised him to take 2 more doses of Imodium AD today, one in early evening and one before bedtime. This should resolve over the next day or so. If not better by next week, return to the clinic.

## 2011-10-30 NOTE — Telephone Encounter (Signed)
Caller: Truett Perna; PCP: Eleonore Chiquito; CB#: [phone];  Call regarding Diarrhea several times since 10/29/11 PM. Appt requested. Approx 20 diarrheal stools. No bloody stools or abd pain. Appt sched for 1330 today with Dr. Clent Ridges.

## 2011-11-05 ENCOUNTER — Other Ambulatory Visit: Payer: Self-pay

## 2011-11-05 MED ORDER — LORAZEPAM 0.5 MG PO TABS: 0.5000 mg | ORAL_TABLET | Freq: Two times a day (BID) | ORAL | Status: DC

## 2011-11-13 ENCOUNTER — Telehealth: Payer: Self-pay | Admitting: Internal Medicine

## 2011-11-13 NOTE — Telephone Encounter (Signed)
Caller: Thomas Hoff; PCP: Eleonore Chiquito; CB#: [phone];  Call regarding Constipation - No Bm Since 12/11/11; he took Dulcolax on 11/11/11 and no results yet. Recently seen in office on 10/30/11 for Diarrhea and advised not use suppositories. He is going to try to increase fiber in his diet. Drinks thickened fliuds. Triage and Care Advice per Constipation Protocol and appnt advised within 2 weeks for "Repeated episodes of constipation requiring home treatment within last 3 months". He is going to try taking 2 Dulcolax tonight or Milk of Magnesia and will call on 810/13 if no results.

## 2011-11-19 ENCOUNTER — Telehealth: Payer: Self-pay | Admitting: Internal Medicine

## 2011-11-19 NOTE — Telephone Encounter (Signed)
Caller: [name]/Patient; Patient Name: [name]; PCP: Eleonore Chiquito; Best Callback Phone Number: [phone].  Call regarding Constipation.  Onset approx one month ago.  Pt says he has been taking Exlax and eating prunes with little relief. Pt has difficulty swallowing, and his fluid intake is limited.  He uses "Thickens It" to drink fluids.   All emergent symptoms ruled out per Constipation protocol with exception to 'Began within one week of starting new prescription, non prescription or alternative medicine/therapy'  See Provider within 72 hrs. Pt states he is getting too tired to stay on the phone and he wants an appt. and he will have transportation 11/20/11.  Advised  pt someone will call him back with to schedule an appt and transportation availability.

## 2011-11-19 NOTE — Telephone Encounter (Signed)
Spoke with pt- discussed meds, diet and back and forth between diarrhea and constipation . Instructed to use Miralax twice today - drinking extra fluids then go to qd. Call on Monday and let me know if feeling better.

## 2011-11-19 NOTE — Telephone Encounter (Signed)
Who shall I sched him with?

## 2011-11-23 ENCOUNTER — Other Ambulatory Visit: Payer: Self-pay

## 2011-11-23 NOTE — Telephone Encounter (Signed)
F[facility]ill request from [facility] for vicodin 5-500 Last written 10/13/11 # 50 3RF Please advise

## 2011-11-24 MED ORDER — HYDROCODONE-ACETAMINOPHEN 5-500 MG PO TABS: 2.0000 | ORAL_TABLET | Freq: Four times a day (QID) | ORAL | Status: DC | PRN

## 2011-11-24 NOTE — Telephone Encounter (Signed)
ok 

## 2011-11-24 NOTE — Telephone Encounter (Signed)
called in 

## 2011-12-08 ENCOUNTER — Other Ambulatory Visit: Payer: Self-pay | Admitting: Internal Medicine

## 2011-12-09 NOTE — Telephone Encounter (Signed)
done

## 2011-12-09 NOTE — Telephone Encounter (Signed)
ok 

## 2011-12-09 NOTE — Telephone Encounter (Signed)
Please advise - last ok'd by [place] 10/2010 - has not been rx's since then from what I see in past and current med list - and I dont see it on current med list - it may have timed out

## 2011-12-21 ENCOUNTER — Telehealth: Payer: Self-pay

## 2011-12-21 ENCOUNTER — Other Ambulatory Visit: Payer: Self-pay

## 2011-12-21 MED ORDER — HYDROCODONE-ACETAMINOPHEN 5-500 MG PO TABS: 1.0000 | ORAL_TABLET | Freq: Four times a day (QID) | ORAL | Status: DC | PRN

## 2011-12-21 NOTE — Telephone Encounter (Signed)
Fax refill request for vicodin 5-500 Last written 11/24/11 # 50 3RF - 2tabs q6h prn  [facility] - [facility] has [facility] all Refills Please advise

## 2011-12-21 NOTE — Telephone Encounter (Signed)
Change prescription to 1 tablet every 6 hours as needed. Please call and #50 and ask  to schedule office visit this week or next

## 2011-12-21 NOTE — Telephone Encounter (Signed)
Opened in error

## 2011-12-21 NOTE — Telephone Encounter (Signed)
Called in , change to med list made

## 2012-01-04 ENCOUNTER — Ambulatory Visit (INDEPENDENT_AMBULATORY_CARE_PROVIDER_SITE_OTHER): Payer: Medicare Other | Admitting: Internal Medicine

## 2012-01-04 ENCOUNTER — Encounter: Payer: Self-pay | Admitting: Internal Medicine

## 2012-01-04 VITALS — BP 100/70 | HR 92 | Resp 22 | Wt 117.0 lb

## 2012-01-04 DIAGNOSIS — R0600 Dyspnea, unspecified: Secondary | ICD-10-CM

## 2012-01-04 DIAGNOSIS — J4489 Other specified chronic obstructive pulmonary disease: Secondary | ICD-10-CM

## 2012-01-04 DIAGNOSIS — I5022 Chronic systolic (congestive) heart failure: Secondary | ICD-10-CM

## 2012-01-04 DIAGNOSIS — J449 Chronic obstructive pulmonary disease, unspecified: Secondary | ICD-10-CM

## 2012-01-04 DIAGNOSIS — R0989 Other specified symptoms and signs involving the circulatory and respiratory systems: Secondary | ICD-10-CM

## 2012-01-04 NOTE — Patient Instructions (Addendum)
Limit your sodium (Salt) intake  Return in 3 months for follow-up   

## 2012-01-04 NOTE — Progress Notes (Signed)
Subjective:    Patient ID: [name], male    DOB: 1917-06-06, 78 y.o.   MRN: [id number]  HPI  78 year old patient who has a history of advanced COPD as well as chronic systolic heart failure. The patient is seen today for general followup as well as a face to face evaluation to assess the need for oxygen therapy. The patient remains on aggressive  pulmonary medicines including nebulizer treatments that  he requires at least 3 times daily. He is also on maintenance Symbicort twice daily.  In spite of these measures, he is frequently short of breath.  Room air oxygen saturation today 86% Oxygen saturation 93% on 2 L of oxygen per minute sitting  Past Medical History  Diagnosis Date  . Abnormality of gait 09/28/2008  . Acquired deformity of chest and rib 01/14/2009  . Atrial fibrillation 09/30/2006  . BENIGN PROSTATIC HYPERTROPHY, WITH URINARY OBSTRUCTION 09/04/2009  . CHRONIC OBSTRUCTIVE PULMONARY DISEASE, ACUTE EXACERBATION 03/24/2007  . Chronic systolic heart failure 08/06/2008  . COPD 09/30/2006  . CORONARY ARTERY DISEASE 10/01/2006  . GERD 04/19/2007  . HYPERTENSION 09/30/2006  . HYPOTHYROIDISM 09/30/2006  . LASSITUDE 06/13/2007  . Osteoarth NOS-Unspec 09/30/2006  . PEPTIC ULCER DISEASE 09/30/2006  . PERIPHERAL VASCULAR DISEASE 09/30/2006  . SPINAL STENOSIS 08/21/2009  . STROKE 08/28/2008    History   Social History  . Marital Status: Married    Spouse Name: N/A    Number of Children: N/A  . Years of Education: N/A   Occupational History  . 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 Smoking status: Former Smoker -- 0.8 packs/day for 20 years    Types: Cigarettes    Quit date: 04/06/1948  . Smokeless tobacco: Never Used  . Alcohol Use: No  . Drug Use: No  . Sexually Active: Not on file   Other Topics Concern  . Not on file   Social History Narrative   Lives at home with wife and has caretaker, still ambulatory daily with a  walker. Had 2 children, 1 passed away.      Past Surgical History  Procedure Date  . Appendectomy   . Hernia repair   . Tonsillectomy   . Abdominal aortic aneurysm repair   . Transurethral resection of prostate   . Lumbar laminectomy     No family history on file.  Allergies  Allergen Reactions  . Bleph-10 (Sulfacetamide Sodium)   . Procaine Hcl     REACTION: syncope    Current Outpatient Prescriptions on File Prior to Visit  Medication Sig Dispense Refill  . budesonide-formoterol (SYMBICORT) 160-4.5 MCG/ACT inhaler Inhale 2 puffs into the lungs 2 (two) times daily.  1 Inhaler  12  . carvedilol (COREG) 6.25 MG tablet One half tablet twice daily  90 tablet  3  . clopidogrel (PLAVIX) 75 MG tablet Take 75 mg by mouth daily.      . digoxin (LANOXIN) 0.125 MG tablet Take 125 mcg by mouth daily.      Marland Kitchen dutasteride (AVODART) 0.5 MG capsule Take 1 capsule (0.5 mg total) by mouth daily.  50 capsule  3  . fluticasone (FLONASE) 50 MCG/ACT nasal spray Place 2 sprays into the nose daily.  1 g  0  . furosemide (LASIX) 20 MG tablet Take 1 tablet (20 mg total) by mouth daily.  90 tablet  3  . HYDROcodone-acetaminophen (VICODIN) 5-500 MG per tablet Take 1 tablet by mouth  every 6 (six) hours as needed.  50 tablet  3  . ipratropium (ATROVENT) 0.02 % nebulizer solution Take 250 mcg by nebulization 2 (two) times daily.      Marland Kitchen ipratropium-albuterol (DUONEB) 0.5-2.5 (3) MG/3ML SOLN USE THREE TIMES DAILY AS NEEDED  270 mL  5  . KLOR-CON 10 10 MEQ tablet TAKE 1 TABLET BY MOUTH EVERY DAY  90 tablet  1  . levothyroxine (SYNTHROID, LEVOTHROID) 50 MCG tablet Take 50 mcg by mouth daily.      Marland Kitchen loratadine (CLARITIN) 10 MG tablet Take 10 mg by mouth daily.      Marland Kitchen LORazepam (ATIVAN) 0.5 MG tablet Take 1 tablet (0.5 mg total) by mouth 2 (two) times daily.  60 tablet  2  . nitroGLYCERIN (NITROSTAT) 0.4 MG SL tablet Place 0.4 mg under the tongue every 5 (five) minutes as needed. For chest pain.      Marland Kitchen ondansetron  (ZOFRAN) 4 MG tablet TAKE 1 TABLET BY MOUTH EVERY 6 HOURS AS NEEDED  30 tablet  0  . pantoprazole (PROTONIX) 40 MG tablet Take 40 mg by mouth daily.      Marland Kitchen spironolactone (ALDACTONE) 25 MG tablet Take 25 mg by mouth 2 (two) times daily.      . Tamsulosin HCl (FLOMAX) 0.4 MG CAPS Take 0.4 mg by mouth daily.        BP 100/70  Pulse 92  Resp 22  Wt 117 lb (53.071 kg)  SpO2 86%       Review of Systems  Constitutional: Negative for fever, chills, appetite change and fatigue.  HENT: Negative for hearing loss, ear pain, congestion, sore throat, trouble swallowing, neck stiffness, dental problem, voice change and tinnitus.   Eyes: Negative for pain, discharge and visual disturbance.  Respiratory: Positive for shortness of breath. Negative for cough, chest tightness, wheezing and stridor.   Cardiovascular: Negative for chest pain, palpitations and leg swelling.  Gastrointestinal: Negative for nausea, vomiting, abdominal pain, diarrhea, constipation, blood in stool and abdominal distention.  Genitourinary: Negative for urgency, hematuria, flank pain, discharge, difficulty urinating and genital sores.  Musculoskeletal: Negative for myalgias, back pain, joint swelling, arthralgias and gait problem.  Skin: Negative for rash.  Neurological: Positive for weakness. Negative for dizziness, syncope, speech difficulty, numbness and headaches.  Hematological: Negative for adenopathy. Does not bruise/bleed easily.  Psychiatric/Behavioral: Negative for behavioral problems and dysphoric mood. The patient is not nervous/anxious.        Objective:   Physical Exam  Constitutional: He is oriented to person, place, and time. He appears well-developed.       Elderly frail appears chronically ill Nasal cannula oxygen in place Blood pressure 100/70 Respiratory 22  HENT:  Head: Normocephalic.  Right Ear: External ear normal.  Left Ear: External ear normal.  Eyes: Conjunctivae normal and EOM are normal.    Neck: Normal range of motion.  Cardiovascular: Normal rate and normal heart sounds.        Irregular rhythm with a rate of 92  Pulmonary/Chest: Effort normal. He has no rales.       Diminished breath sounds  Abdominal: Bowel sounds are normal.  Musculoskeletal: Normal range of motion. He exhibits no edema and no tenderness.  Neurological: He is alert and oriented to person, place, and time.  Psychiatric: He has a normal mood and affect. His behavior is normal.          Assessment & Plan:   Advanced COPD with chronic hypoxic respiratory failure. Will continue  with chronic home O2 Chronic systolic heart failure compensated Hypertension stable  Recheck 3 months Paperwork for certification completed

## 2012-01-14 ENCOUNTER — Ambulatory Visit: Payer: Self-pay | Admitting: Internal Medicine

## 2012-01-28 ENCOUNTER — Other Ambulatory Visit: Payer: Self-pay | Admitting: Internal Medicine

## 2012-02-01 ENCOUNTER — Ambulatory Visit: Payer: Self-pay | Admitting: Internal Medicine

## 2012-02-08 ENCOUNTER — Other Ambulatory Visit: Payer: Self-pay | Admitting: Internal Medicine

## 2012-02-09 NOTE — Telephone Encounter (Signed)
ok 

## 2012-02-09 NOTE — Telephone Encounter (Signed)
Last seen 01/04/12  rov for ppwk Last written vicodin 12-21-11 # [phone] Please advise

## 2012-02-09 NOTE — Telephone Encounter (Signed)
Called in.

## 2012-02-10 ENCOUNTER — Encounter (HOSPITAL_COMMUNITY)
Admission: EM | Disposition: A | Discharge status: Home / Self Care | Payer: Self-pay | Source: Home / Self Care | Attending: Emergency Medicine

## 2012-02-10 ENCOUNTER — Encounter (HOSPITAL_COMMUNITY): Payer: Self-pay | Admitting: Anesthesiology

## 2012-02-10 ENCOUNTER — Ambulatory Visit (HOSPITAL_COMMUNITY)
Admission: EM | Admit: 2012-02-10 | Discharge: 2012-02-10 | Disposition: A | Discharge status: Home / Self Care | Payer: Medicare Other | Attending: Emergency Medicine | Admitting: Emergency Medicine

## 2012-02-10 ENCOUNTER — Emergency Department (HOSPITAL_COMMUNITY): Payer: Medicare Other

## 2012-02-10 ENCOUNTER — Observation Stay (HOSPITAL_COMMUNITY): Payer: Medicare Other | Admitting: Anesthesiology

## 2012-02-10 ENCOUNTER — Other Ambulatory Visit: Payer: Self-pay | Admitting: Internal Medicine

## 2012-02-10 ENCOUNTER — Encounter (HOSPITAL_COMMUNITY): Payer: Self-pay | Admitting: Emergency Medicine

## 2012-02-10 DIAGNOSIS — E039 Hypothyroidism, unspecified: Secondary | ICD-10-CM | POA: Insufficient documentation

## 2012-02-10 DIAGNOSIS — I2510 Atherosclerotic heart disease of native coronary artery without angina pectoris: Secondary | ICD-10-CM | POA: Insufficient documentation

## 2012-02-10 DIAGNOSIS — I10 Essential (primary) hypertension: Secondary | ICD-10-CM | POA: Insufficient documentation

## 2012-02-10 DIAGNOSIS — J4489 Other specified chronic obstructive pulmonary disease: Secondary | ICD-10-CM | POA: Insufficient documentation

## 2012-02-10 DIAGNOSIS — Z8719 Personal history of other diseases of the digestive system: Secondary | ICD-10-CM

## 2012-02-10 DIAGNOSIS — T17208A Unspecified foreign body in pharynx causing other injury, initial encounter: Secondary | ICD-10-CM | POA: Insufficient documentation

## 2012-02-10 DIAGNOSIS — IMO0002 Reserved for concepts with insufficient information to code with codable children: Secondary | ICD-10-CM | POA: Insufficient documentation

## 2012-02-10 DIAGNOSIS — K219 Gastro-esophageal reflux disease without esophagitis: Secondary | ICD-10-CM | POA: Insufficient documentation

## 2012-02-10 DIAGNOSIS — J449 Chronic obstructive pulmonary disease, unspecified: Secondary | ICD-10-CM | POA: Insufficient documentation

## 2012-02-10 DIAGNOSIS — Z8673 Personal history of transient ischemic attack (TIA), and cerebral infarction without residual deficits: Secondary | ICD-10-CM | POA: Insufficient documentation

## 2012-02-10 DIAGNOSIS — R110 Nausea: Secondary | ICD-10-CM

## 2012-02-10 HISTORY — PX: LARYNGOSCOPY AND ESOPHAGOSCOPY: SHX5660

## 2012-02-10 LAB — CBC WITH DIFFERENTIAL/PLATELET
Eosinophils Absolute: 0 10*3/uL (ref 0.0–0.7)
Hemoglobin: 12.7 g/dL — ABNORMAL LOW (ref 13.0–17.0)
Lymphocytes Relative: 20 % (ref 12–46)
Lymphs Abs: 1.3 10*3/uL (ref 0.7–4.0)
MCH: 28.5 pg (ref 26.0–34.0)
MCV: 85.7 fL (ref 78.0–100.0)
Monocytes Relative: 8 % (ref 3–12)
Neutrophils Relative %: 71 % (ref 43–77)
Platelets: 193 10*3/uL (ref 150–400)
RBC: 4.46 MIL/uL (ref 4.22–5.81)
WBC: 6.4 10*3/uL (ref 4.0–10.5)

## 2012-02-10 LAB — BASIC METABOLIC PANEL
BUN: 21 mg/dL (ref 6–23)
CO2: 31 mEq/L (ref 19–32)
Glucose, Bld: 107 mg/dL — ABNORMAL HIGH (ref 70–99)
Potassium: 4.7 mEq/L (ref 3.5–5.1)
Sodium: 128 mEq/L — ABNORMAL LOW (ref 135–145)

## 2012-02-10 SURGERY — LARYNGOSCOPY, WITH ESOPHAGOSCOPY
Anesthesia: General | Site: Throat | Wound class: Clean Contaminated

## 2012-02-10 MED ORDER — MEPERIDINE HCL 50 MG/ML IJ SOLN: 6.2500 mg | INTRAMUSCULAR | Status: DC | PRN

## 2012-02-10 MED ORDER — 0.9 % SODIUM CHLORIDE (POUR BTL) OPTIME
TOPICAL | Status: DC | PRN
  Administered 2012-02-10: 1000 mL

## 2012-02-10 MED ORDER — SUCCINYLCHOLINE CHLORIDE 20 MG/ML IJ SOLN
INTRAMUSCULAR | Status: DC | PRN
  Administered 2012-02-10: 100 mg via INTRAVENOUS

## 2012-02-10 MED ORDER — LIDOCAINE HCL 4 % MT SOLN
OROMUCOSAL | Status: DC | PRN
  Administered 2012-02-10: 4 mL via TOPICAL

## 2012-02-10 MED ORDER — SODIUM CHLORIDE 0.9 % IV BOLUS (SEPSIS)
250.0000 mL | Freq: Once | INTRAVENOUS | Status: AC
  Administered 2012-02-10: 250 mL via INTRAVENOUS

## 2012-02-10 MED ORDER — LACTATED RINGERS IV SOLN: INTRAVENOUS | Status: DC

## 2012-02-10 MED ORDER — PROMETHAZINE HCL 25 MG/ML IJ SOLN: 6.2500 mg | INTRAMUSCULAR | Status: DC | PRN

## 2012-02-10 MED ORDER — NITROGLYCERIN 0.4 MG SL SUBL
0.4000 mg | SUBLINGUAL_TABLET | Freq: Once | SUBLINGUAL | Status: AC
  Administered 2012-02-10: 0.4 mg via SUBLINGUAL
  Filled 2012-02-10: qty 25

## 2012-02-10 MED ORDER — LACTATED RINGERS IV SOLN
INTRAVENOUS | Status: DC | PRN
  Administered 2012-02-10: 20:00:00 via INTRAVENOUS

## 2012-02-10 MED ORDER — PROPOFOL 10 MG/ML IV BOLUS
INTRAVENOUS | Status: DC | PRN
  Administered 2012-02-10: 80 mg via INTRAVENOUS

## 2012-02-10 MED ORDER — LIDOCAINE HCL 2 % EX GEL
CUTANEOUS | Status: AC
  Filled 2012-02-10: qty 10

## 2012-02-10 SURGICAL SUPPLY — 13 items
CLOTH BEACON ORANGE TIMEOUT ST (SAFETY) ×2 IMPLANT
DRAPE LG THREE QUARTER DISP (DRAPES) ×2 IMPLANT
GLOVE BIOGEL PI IND STRL 7.0 (GLOVE) ×1 IMPLANT
GLOVE BIOGEL PI INDICATOR 7.0 (GLOVE) ×1
GLOVE ECLIPSE 8.0 STRL XLNG CF (GLOVE) ×2 IMPLANT
GOWN STRL REIN XL XLG (GOWN DISPOSABLE) ×4 IMPLANT
KIT BASIN OR (CUSTOM PROCEDURE TRAY) ×2 IMPLANT
LUBRICANT JELLY K Y 4OZ (MISCELLANEOUS) ×2 IMPLANT
NS IRRIG 1000ML POUR BTL (IV SOLUTION) ×2 IMPLANT
PACK BASIC VI WITH GOWN DISP (CUSTOM PROCEDURE TRAY) ×2 IMPLANT
SPONGE GAUZE 4X4 12PLY (GAUZE/BANDAGES/DRESSINGS) ×2 IMPLANT
TOWEL OR 17X26 10 PK STRL BLUE (TOWEL DISPOSABLE) ×2 IMPLANT
TUBING CONNECTING 10 (TUBING) ×2 IMPLANT

## 2012-02-10 NOTE — Anesthesia Preprocedure Evaluation (Signed)
Anesthesia Evaluation  Patient identified by MRN, date of birth, ID band Patient awake    Reviewed: Allergy & Precautions, H&P , NPO status , Patient's Chart, lab work & pertinent test results  Airway Mallampati: II TM Distance: >3 FB Neck ROM: Full    Dental No notable dental hx. (+) Edentulous Lower, Edentulous Upper, Lower Dentures and Upper Dentures   Pulmonary shortness of breath and with exertion, COPD breath sounds clear to auscultation  Pulmonary exam normal       Cardiovascular hypertension, Pt. on medications + CAD + dysrhythmias Atrial Fibrillation Rhythm:Regular Rate:Normal     Neuro/Psych CVA, No Residual Symptoms negative neurological ROS  negative psych ROS   GI/Hepatic Neg liver ROS, GERD-  ,  Endo/Other  Hypothyroidism   Renal/GU negative Renal ROS  negative genitourinary   Musculoskeletal negative musculoskeletal ROS (+)   Abdominal   Peds negative pediatric ROS (+)  Hematology negative hematology ROS (+)   Anesthesia Other Findings   Reproductive/Obstetrics negative OB ROS                           Anesthesia Physical Anesthesia Plan  ASA: II  Anesthesia Plan: General   Post-op Pain Management:    Induction: Intravenous  Airway Management Planned: Oral ETT  Additional Equipment:   Intra-op Plan:   Post-operative Plan: Extubation in OR  Informed Consent: I have reviewed the patients History and Physical, chart, labs and discussed the procedure including the risks, benefits and alternatives for the proposed anesthesia with the patient or authorized representative who has indicated his/her understanding and acceptance.   Dental advisory given  Plan Discussed with: CRNA  Anesthesia Plan Comments:         Anesthesia Quick Evaluation

## 2012-02-10 NOTE — ED Notes (Signed)
Pt presents to ED with foreign body stuck in throat.   Caregiver reports that pt was at [employer] eating when a piece of chicken became lodged in his throat.  Pt is NAD and able to talk.  Caregiver denies pt ever choking on food, he just feels like it's lodged.

## 2012-02-10 NOTE — Transfer of Care (Signed)
Immediate Anesthesia Transfer of Care Note  Patient: [name]  Procedure(s) Performed: Procedure(s) (LRB) with comments: LARYNGOSCOPY AND ESOPHAGOSCOPY (N/A) - DIRECT LARYNGOSCOPY/REMOVAL FOREIGN BODY FROM ESOPHAGUS  Patient Location: PACU  Anesthesia Type:General  Level of Consciousness: awake, alert  and patient cooperative  Airway & Oxygen Therapy: Patient Spontanous Breathing and Patient connected to face mask oxygen  Post-op Assessment: Report given to PACU RN and Post -op Vital signs reviewed and stable  Post vital signs: Reviewed and stable  Complications: No apparent anesthesia complications

## 2012-02-10 NOTE — ED Notes (Signed)
MD at bedside. 

## 2012-02-10 NOTE — ED Provider Notes (Signed)
History     CSN: [id number]  Arrival date & time 02/10/12  1526   First MD Initiated Contact with Patient 02/10/12 1551      Chief Complaint  Patient presents with  . foreign body in throat    (Consider location/radiation/quality/duration/timing/severity/associated sxs/prior treatment) HPI [name] is a 78 y.o. male who is at [employer] with his caregiver when he feels like a piece of food got stuck in his throat. He was eating chicken at a time. Patient has had esophageal stricture before was dilated approximately 10 years ago in [facility]. Does not have a local gastroenterologist. He's had no shortness of breath, no chest pain, no abnormal breath sounds and has been able to talk freely. He says he feels like there is something for it in his throat, he is having frequent retching and is nauseous. He is spitting saliva and occasional small pieces of food. Symptoms are severe, unchanged, with no other alleviating or exacerbating factors no other associated symptoms.    Past Medical History  Diagnosis Date  . Abnormality of gait 09/28/2008  . Acquired deformity of chest and rib 01/14/2009  . Atrial fibrillation 09/30/2006  . BENIGN PROSTATIC HYPERTROPHY, WITH URINARY OBSTRUCTION 09/04/2009  . CHRONIC OBSTRUCTIVE PULMONARY DISEASE, ACUTE EXACERBATION 03/24/2007  . Chronic systolic heart failure 08/06/2008  . COPD 09/30/2006  . CORONARY ARTERY DISEASE 10/01/2006  . GERD 04/19/2007  . HYPERTENSION 09/30/2006  . HYPOTHYROIDISM 09/30/2006  . LASSITUDE 06/13/2007  . Osteoarth NOS-Unspec 09/30/2006  . PEPTIC ULCER DISEASE 09/30/2006  . PERIPHERAL VASCULAR DISEASE 09/30/2006  . SPINAL STENOSIS 08/21/2009  . STROKE 08/28/2008    Past Surgical History  Procedure Date  . Appendectomy   . Hernia repair   . Tonsillectomy   . Abdominal aortic aneurysm repair   . Transurethral resection of prostate   . Lumbar laminectomy     No family history on file.  History    Substance Use Topics  . Smoking status: Former Smoker -- 0.8 packs/day for 20 years    Types: Cigarettes    Quit date: 04/06/1948  . Smokeless tobacco: Never Used  . Alcohol Use: No      Review of Systems At least 10pt or greater review of systems completed and are negative except where specified in the HPI.  Allergies  Bleph-10 and Procaine hcl  Home Medications   Current Outpatient Rx  Name  Route  Sig  Dispense  Refill  . BUDESONIDE-FORMOTEROL FUMARATE 160-4.5 MCG/ACT IN AERO   Inhalation   Inhale 2 puffs into the lungs 2 (two) times daily.   1 Inhaler   12   . CARVEDILOL 6.25 MG PO TABS   Oral   Take 3.125 mg by mouth 2 (two) times daily with a meal. One half tablet twice daily         . CLOPIDOGREL BISULFATE 75 MG PO TABS   Oral   Take 75 mg by mouth daily.         Marland Kitchen DIGOXIN 0.125 MG PO TABS   Oral   Take 125 mcg by mouth daily.         . DUTASTERIDE 0.5 MG PO CAPS   Oral   Take 1 capsule (0.5 mg total) by mouth daily.   50 capsule   3   . FLUTICASONE PROPIONATE 50 MCG/ACT NA SUSP   Nasal   Place 2 sprays into the nose daily.   1 g   0   .  FUROSEMIDE 20 MG PO TABS   Oral   Take 1 tablet (20 mg total) by mouth daily.   90 tablet   3   . IPRATROPIUM BROMIDE 0.02 % IN SOLN   Nebulization   Take 250 mcg by nebulization 2 (two) times daily.         Marland Kitchen LEVOTHYROXINE SODIUM 50 MCG PO TABS   Oral   Take 50 mcg by mouth daily.         Marland Kitchen LORAZEPAM 0.5 MG PO TABS   Oral   Take 0.5 mg by mouth every 8 (eight) hours as needed. Anxiety         . PANTOPRAZOLE SODIUM 40 MG PO TBEC   Oral   Take 40 mg by mouth daily.         Marland Kitchen POTASSIUM CHLORIDE CRYS ER 10 MEQ PO TBCR   Oral   Take 10 mEq by mouth daily.         Marland Kitchen SPIRONOLACTONE 25 MG PO TABS   Oral   Take 25 mg by mouth 2 (two) times daily.         Marland Kitchen TAMSULOSIN HCL 0.4 MG PO CAPS   Oral   Take 0.4 mg by mouth daily.         . TRAMADOL HCL 50 MG PO TABS   Oral   Take 50 mg  by mouth every 6 (six) hours as needed.           BP 140/77  Pulse 75  Temp 97.7 F (36.5 C) (Oral)  Resp 23  SpO2 98%  Physical Exam  Nursing notes reviewed.  Electronic medical record reviewed. VITAL SIGNS:   Filed Vitals:   02/10/12 2117 02/10/12 2130 02/10/12 2145 02/10/12 2200  BP: 179/97 167/100 171/151 172/95  Pulse: 101 62 32   Temp: 97.4 F (36.3 C)  97.4 F (36.3 C)   TempSrc:      Resp: 22 20 20 24   SpO2: 92% 87% 91% 98%   CONSTITUTIONAL: Awake, oriented, appears non-toxic HENT: Atraumatic, normocephalic, oral mucosa pink and moist, airway patent. Nares patent without drainage. External ears normal. EYES: Conjunctiva clear, EOMI, PERRLA NECK: Trachea midline, non-tender, supple CARDIOVASCULAR: Normal heart rate, Normal rhythm, No murmurs, rubs, gallops PULMONARY/CHEST: Clear to auscultation, no rhonchi, wheezes, or rales. Symmetrical breath sounds. Non-tender. ABDOMINAL: Non-distended, soft, non-tender - no rebound or guarding.  BS normal. NEUROLOGIC: Non-focal, moving all four extremities, no gross sensory or motor deficits. EXTREMITIES: No clubbing, cyanosis, or edema SKIN: Warm, Dry, No erythema, No rash  ED Course  Procedures (including critical care time)  Labs Reviewed  CBC WITH DIFFERENTIAL - Abnormal; Notable for the following:    Hemoglobin 12.7 (*)     HCT 38.2 (*)     All other components within normal limits  BASIC METABOLIC PANEL - Abnormal; Notable for the following:    Sodium 128 (*)     Chloride 91 (*)     Glucose, Bld 107 (*)     GFR calc non Af Amer 77 (*)     GFR calc Af Amer 89 (*)     All other components within normal limits   Dg Neck Soft Tissue  02/10/2012  *RADIOLOGY REPORT*  Clinical Data: Food impaction in the pharynx.  NECK SOFT TISSUES - 1+ VIEW  Comparison: Soft tissue window images from CT cervical spine 02/15/2011.  Findings: There is opaque material within one if not both of the piriform sinuses in the pharynx,  consistent with ingested food. Normal epiglottis.  Normal prevertebral soft tissues.  Interval development of ossification along the anterior longitudinal ligament of the cervical spine.  IMPRESSION:  1.  Opaque foreign bodies within one if not both piriform sinuses, consistent with the history of ingested food. 2.  Ossification involving the anterior longitudinal ligament, consistent with ankylosing spondylitis.   Original Report Authenticated By: Hulan Saas, M.D.    Dg Chest 2 View  02/10/2012  *RADIOLOGY REPORT*  Clinical Data: Foreign body throat, rule out aspiration  CHEST - 2 VIEW  Comparison: 07/12/2011  Findings: Stable thoracic kyphosis and mild thoracic dextroscoliosis.  No acute infiltrate or pulmonary edema.  Mild left basilar atelectasis.  Atherosclerotic calcifications of thoracic aorta again noted.  Diffuse osteopenia.  IMPRESSION: No acute infiltrate or pulmonary edema. Mild left basilar atelectasis.  Stable kyphosis and mild thoracic dextroscoliosis.   Original Report Authenticated By: Natasha Mead, M.D.      1. Foreign body in throat   2. Nausea   3. Regurgitation   4. History of esophageal stricture   5. Pharyngeal foreign body       MDM  SAMIER SCORDATO is a 78 y.o. male presenting with food impaction likely and perform sinuses by x-ray. Patient's airway is patent, he is in no respiratory distress at this time. Discussed this case with Dr. Lazarus Salines who will take the patient to the OR for direct laryngoscopy, bolus removal and esophagoscopy.         Jones Skene, MD 02/11/12 [id number]

## 2012-02-10 NOTE — Anesthesia Postprocedure Evaluation (Signed)
  Anesthesia Post-op Note  Patient: [name]  Procedure(s) Performed: Procedure(s) (LRB): LARYNGOSCOPY AND ESOPHAGOSCOPY (N/A)  Patient Location: PACU  Anesthesia Type: General  Level of Consciousness: awake and alert   Airway and Oxygen Therapy: Patient Spontanous Breathing  Post-op Pain: mild  Post-op Assessment: Post-op Vital signs reviewed, Patient's Cardiovascular Status Stable, Respiratory Function Stable, Patent Airway and No signs of Nausea or vomiting  Post-op Vital Signs: stable  Complications: No apparent anesthesia complications

## 2012-02-10 NOTE — Op Note (Signed)
02/10/2012  9:08 PM    [name]  [id number]   Pre-Op Dx:  Pharyngeal foreign body (food)  Post-op Dx: Same  Proc: Direct laryngoscopy with removal of foreign body. Esophagoscopy   Surg:  Flo Shanks T MD  Anes:  GOT  EBL:  Minimal  Comp:  None  Findings:  Impacted fibrous apparent meat lodged in the cricopharyngeus. No stricture down to 37 cm. Easy passage of an 62 French nasogastric tube into the stomach.   Procedure: With the patient in a comfortable supine position, GOT anesthesia was induced without difficulty.  At an appropriate level, the table was turned 90 degrees away from Anesthesia.  A clean preparation and draping was performed in the standard fashion.  A moist 4 x 4 was placed.   The anterior commissure laryngoscope was introduced taking care to protect lips, gums, and endotracheal tube.  Complete laryngoscopy was performed in the standard fashion.  The findings were as described above.  The laryngoscope was removed.  The cervical esophagoscope was lubricated and inserted into the hypopharynx.  With gentle pressure, it was passed through the cricopharyngeus and advanced to its full length without difficulty with the findings as described above. 1 additional piece of food beneath the cricopharyngeus was removed.  An 40 French nasogastric tube was passed in the esophagoscope and down to the stomach and clear gastric secretions were evacuated. The NG tube was removed. The esophagoscope was removed.    The full length adult esophagoscope was introduced and passed without difficulty to 37 cm.  No additional findings. It was removed.   At this point the procedure was completed.  The 4 x 4 was removed.  The patient was returned to Anesthesia, awakened, extubated, and transferred to PACU in satisfactory condition.   Dispo:   PACU to home  Plan:  Advance diet as comfortable. He should have a barium swallow in the near future. He may need a repeat  evaluation with gastroenterology. Recheck my office as needed.  Cephus Richer MD

## 2012-02-10 NOTE — H&P (Signed)
[name], [name] 78 y.o., male [id number]     Chief Complaint:  Throat pain, dysphagia  HPI: 78 yo wm, felt chicken lodge in his throat at lunch today.  Prior to this, swallowing was OK.  Has upper and lower dentures.  Has had modified ba swallow in past for dysphagia predominantly for pills.  Had dilation many years ago.  Does not have a current Gastroenterologist.  No breathing diff or voice change.  Npo since lunch.  Soft tissue neck shows probable food impactions in one or both pyriform sinuses.  PMH: Past Medical History  Diagnosis Date  . Abnormality of gait 09/28/2008  . Acquired deformity of chest and rib 01/14/2009  . Atrial fibrillation 09/30/2006  . BENIGN PROSTATIC HYPERTROPHY, WITH URINARY OBSTRUCTION 09/04/2009  . CHRONIC OBSTRUCTIVE PULMONARY DISEASE, ACUTE EXACERBATION 03/24/2007  . Chronic systolic heart failure 08/06/2008  . COPD 09/30/2006  . CORONARY ARTERY DISEASE 10/01/2006  . GERD 04/19/2007  . HYPERTENSION 09/30/2006  . HYPOTHYROIDISM 09/30/2006  . LASSITUDE 06/13/2007  . Osteoarth NOS-Unspec 09/30/2006  . PEPTIC ULCER DISEASE 09/30/2006  . PERIPHERAL VASCULAR DISEASE 09/30/2006  . SPINAL STENOSIS 08/21/2009  . STROKE 08/28/2008    Surg Hx: Past Surgical History  Procedure Date  . Appendectomy   . Hernia repair   . Tonsillectomy   . Abdominal aortic aneurysm repair   . Transurethral resection of prostate   . Lumbar laminectomy     FHx:  No family history on file. SocHx:  reports that he quit smoking about 63 years ago. His smoking use included Cigarettes. He has a 16 pack-year smoking history. He has never used smokeless tobacco. He reports that he does not drink alcohol or use illicit drugs.  ALLERGIES:  Allergies  Allergen Reactions  . Bleph-10 (Sulfacetamide Sodium)   . Procaine Hcl     REACTION: syncope     (Not in a hospital admission)  Results for orders placed during the hospital encounter of 02/10/12 (from the past 48 hour(s))  CBC WITH  DIFFERENTIAL     Status: Abnormal   Collection Time   02/10/12  4:15 PM      Component Value Range Comment   WBC 6.4  4.0 - 10.5 K/uL    RBC 4.46  4.22 - 5.81 MIL/uL    Hemoglobin 12.7 (*) 13.0 - 17.0 g/dL    HCT 29.5 (*) 62.1 - 52.0 %    MCV 85.7  78.0 - 100.0 fL    MCH 28.5  26.0 - 34.0 pg    MCHC 33.2  30.0 - 36.0 g/dL    RDW 30.8  65.7 - 84.6 %    Platelets 193  150 - 400 K/uL    Neutrophils Relative 71  43 - 77 %    Neutro Abs 4.5  1.7 - 7.7 K/uL    Lymphocytes Relative 20  12 - 46 %    Lymphs Abs 1.3  0.7 - 4.0 K/uL    Monocytes Relative 8  3 - 12 %    Monocytes Absolute 0.5  0.1 - 1.0 K/uL    Eosinophils Relative 1  0 - 5 %    Eosinophils Absolute 0.0  0.0 - 0.7 K/uL    Basophils Relative 0  0 - 1 %    Basophils Absolute 0.0  0.0 - 0.1 K/uL   BASIC METABOLIC PANEL     Status: Abnormal   Collection Time   02/10/12  4:15 PM      Component Value Range  Comment   Sodium 128 (*) 135 - 145 mEq/L    Potassium 4.7  3.5 - 5.1 mEq/L    Chloride 91 (*) 96 - 112 mEq/L    CO2 31  19 - 32 mEq/L    Glucose, Bld 107 (*) 70 - 99 mg/dL    BUN 21  6 - 23 mg/dL    Creatinine, Ser 4.09  0.50 - 1.35 mg/dL    Calcium 9.0  8.4 - 81.1 mg/dL    GFR calc non Af Amer 77 (*) >90 mL/min    GFR calc Af Amer 89 (*) >90 mL/min    Dg Neck Soft Tissue  02/10/2012  *RADIOLOGY REPORT*  Clinical Data: Food impaction in the pharynx.  NECK SOFT TISSUES - 1+ VIEW  Comparison: Soft tissue window images from CT cervical spine 02/15/2011.  Findings: There is opaque material within one if not both of the piriform sinuses in the pharynx, consistent with ingested food. Normal epiglottis.  Normal prevertebral soft tissues.  Interval development of ossification along the anterior longitudinal ligament of the cervical spine.  IMPRESSION:  1.  Opaque foreign bodies within one if not both piriform sinuses, consistent with the history of ingested food. 2.  Ossification involving the anterior longitudinal ligament, consistent  with ankylosing spondylitis.   Original Report Authenticated By: Hulan Saas, M.D.    Dg Chest 2 View  02/10/2012  *RADIOLOGY REPORT*  Clinical Data: Foreign body throat, rule out aspiration  CHEST - 2 VIEW  Comparison: 07/12/2011  Findings: Stable thoracic kyphosis and mild thoracic dextroscoliosis.  No acute infiltrate or pulmonary edema.  Mild left basilar atelectasis.  Atherosclerotic calcifications of thoracic aorta again noted.  Diffuse osteopenia.  IMPRESSION: No acute infiltrate or pulmonary edema. Mild left basilar atelectasis.  Stable kyphosis and mild thoracic dextroscoliosis.   Original Report Authenticated By: Natasha Mead, M.D.     Blood pressure 140/77, pulse 75, temperature 97.7 F (36.5 C), temperature source Oral, resp. rate 23, SpO2 98.00%.  PHYSICAL EXAM: Overall appearance:  Frail, old.  Mental status sharp.  Breathing freely, voice clear.  Winces with swallow. Head:  NCAt Ears:  Mod wax AU Nose:  congested Oral Cavity:  Edentulous with upper and lower plates  Oral Pharynx/Hypopharynx/Larynx:  gag Neuro:  Grossly intact Neck:  No nodes  Per flexible laryngoscopy, vocal cords mobile.  No supraglottic swelling.  Some pyriform pooling.    Studies Reviewed:  Soft tissue lateral neck    Assessment/Plan Pharyngeal food impaction  To OR for DL, removal of food impaction.  Esophagoscopy, with dilation as visibly needed.  When clear, he will be able to go home.  Will need elective OP w/u for dysphagia including Ba swallow and probable Gastroenterology consult.  He understands and agrees with discussion.  Informed consent obtained.  Flo Shanks 02/10/2012, 6:53 PM

## 2012-02-11 ENCOUNTER — Encounter (HOSPITAL_COMMUNITY): Payer: Self-pay | Admitting: Otolaryngology

## 2012-02-12 ENCOUNTER — Ambulatory Visit (INDEPENDENT_AMBULATORY_CARE_PROVIDER_SITE_OTHER): Payer: Medicare Other | Admitting: Internal Medicine

## 2012-02-12 ENCOUNTER — Encounter: Payer: Self-pay | Admitting: Internal Medicine

## 2012-02-12 VITALS — BP 122/80

## 2012-02-12 DIAGNOSIS — J690 Pneumonitis due to inhalation of food and vomit: Secondary | ICD-10-CM

## 2012-02-12 DIAGNOSIS — R0600 Dyspnea, unspecified: Secondary | ICD-10-CM

## 2012-02-12 DIAGNOSIS — J449 Chronic obstructive pulmonary disease, unspecified: Secondary | ICD-10-CM

## 2012-02-12 DIAGNOSIS — I10 Essential (primary) hypertension: Secondary | ICD-10-CM

## 2012-02-12 DIAGNOSIS — R0609 Other forms of dyspnea: Secondary | ICD-10-CM

## 2012-02-12 MED ORDER — LEVOFLOXACIN 500 MG PO TABS: 500.0000 mg | ORAL_TABLET | Freq: Every day | ORAL | Status: DC

## 2012-02-12 NOTE — Patient Instructions (Signed)
Take your antibiotic as prescribed until ALL of it is gone, but stop if you develop a rash, swelling, or any side effects of the medication.  Contact our office as soon as possible if  there are side effects of the medication.  Return office visit next week  Report to the emergency room if any clinical worsening such as fever or worsening shortness of breath  Nebulizer treatments every 6 hours

## 2012-02-12 NOTE — Progress Notes (Signed)
Subjective:    Patient ID: [name], male    DOB: 11/19/1917, 78 y.o.   MRN: [id number]  HPI  78 year old patient has a history of bad COPD. He is on home O2 therapy as well as nebulizer treatments. He required upper endoscopy 2 days ago for extraction of a food impaction. For the past 2 days she has developed increasing productive cough weakness and more shortness of breath. He has had pneumonia a number of times over the years and he feels this has reoccurred. No fever or chills. He does also have a history of chronic systolic heart failure coronary artery disease and treated hypertension.  Past Medical History  Diagnosis Date  . Abnormality of gait 09/28/2008  . Acquired deformity of chest and rib 01/14/2009  . Atrial fibrillation 09/30/2006  . BENIGN PROSTATIC HYPERTROPHY, WITH URINARY OBSTRUCTION 09/04/2009  . CHRONIC OBSTRUCTIVE PULMONARY DISEASE, ACUTE EXACERBATION 03/24/2007  . Chronic systolic heart failure 08/06/2008  . COPD 09/30/2006  . CORONARY ARTERY DISEASE 10/01/2006  . GERD 04/19/2007  . HYPERTENSION 09/30/2006  . HYPOTHYROIDISM 09/30/2006  . LASSITUDE 06/13/2007  . Osteoarth NOS-Unspec 09/30/2006  . PEPTIC ULCER DISEASE 09/30/2006  . PERIPHERAL VASCULAR DISEASE 09/30/2006  . SPINAL STENOSIS 08/21/2009  . STROKE 08/28/2008    History   Social History  . Marital Status: Married    Spouse Name: N/A    Number of Children: N/A  . Years of Education: N/A   Occupational History  . 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 Smoking status: Former Smoker -- 0.8 packs/day for 20 years    Types: Cigarettes    Quit date: 04/06/1948  . Smokeless tobacco: Never Used  . Alcohol Use: No  . Drug Use: No  . Sexually Active: Not on file   Other Topics Concern  . Not on file   Social History Narrative   Lives at home with wife and has caretaker, still ambulatory daily with a walker. Had 2 children, 1 passed away.      Past  Surgical History  Procedure Date  . Appendectomy   . Hernia repair   . Tonsillectomy   . Abdominal aortic aneurysm repair   . Transurethral resection of prostate   . Lumbar laminectomy   . Laryngoscopy and esophagoscopy 02/10/2012    Procedure: LARYNGOSCOPY AND ESOPHAGOSCOPY;  Surgeon: Flo Shanks, MD;  Location: [facility];  Service: ENT;  Laterality: N/A;  DIRECT LARYNGOSCOPY/REMOVAL FOREIGN BODY FROM ESOPHAGUS    No family history on file.  Allergies  Allergen Reactions  . Bleph-10 (Sulfacetamide Sodium)   . Procaine Hcl     REACTION: syncope    Current Outpatient Prescriptions on File Prior to Visit  Medication Sig Dispense Refill  . budesonide-formoterol (SYMBICORT) 160-4.5 MCG/ACT inhaler Inhale 2 puffs into the lungs 2 (two) times daily.  1 Inhaler  12  . carvedilol (COREG) 6.25 MG tablet Take 3.125 mg by mouth 2 (two) times daily with a meal. One half tablet twice daily      . clopidogrel (PLAVIX) 75 MG tablet Take 75 mg by mouth daily.      . digoxin (LANOXIN) 0.125 MG tablet Take 125 mcg by mouth daily.      Marland Kitchen dutasteride (AVODART) 0.5 MG capsule Take 1 capsule (0.5 mg total) by mouth daily.  50 capsule  3  . fluticasone (FLONASE) 50 MCG/ACT nasal spray Place 2 sprays into the nose daily.  1 g  0  . furosemide (LASIX) 20 MG tablet Take 1 tablet (20 mg total) by mouth daily.  90 tablet  3  . ipratropium (ATROVENT) 0.02 % nebulizer solution Take 250 mcg by nebulization 2 (two) times daily.      Marland Kitchen levothyroxine (SYNTHROID, LEVOTHROID) 50 MCG tablet Take 50 mcg by mouth daily.      Marland Kitchen LORazepam (ATIVAN) 0.5 MG tablet Take 0.5 mg by mouth every 8 (eight) hours as needed. Anxiety      . pantoprazole (PROTONIX) 40 MG tablet Take 40 mg by mouth daily.      . potassium chloride (K-DUR,KLOR-CON) 10 MEQ tablet Take 10 mEq by mouth daily.      Marland Kitchen spironolactone (ALDACTONE) 25 MG tablet Take 25 mg by mouth 2 (two) times daily.      . Tamsulosin HCl (FLOMAX) 0.4 MG CAPS Take 0.4 mg by  mouth daily.      . traMADol (ULTRAM) 50 MG tablet Take 50 mg by mouth every 6 (six) hours as needed.        BP 122/80       Review of Systems  Constitutional: Positive for fatigue. Negative for fever, chills and appetite change.  HENT: Negative for hearing loss, ear pain, congestion, sore throat, trouble swallowing, neck stiffness, dental problem, voice change and tinnitus.   Eyes: Negative for pain, discharge and visual disturbance.  Respiratory: Positive for cough and shortness of breath. Negative for chest tightness, wheezing and stridor.   Cardiovascular: Negative for chest pain, palpitations and leg swelling.  Gastrointestinal: Negative for nausea, vomiting, abdominal pain, diarrhea, constipation, blood in stool and abdominal distention.  Genitourinary: Negative for urgency, hematuria, flank pain, discharge, difficulty urinating and genital sores.  Musculoskeletal: Negative for myalgias, back pain, joint swelling, arthralgias and gait problem.  Skin: Negative for rash.  Neurological: Positive for weakness. Negative for dizziness, syncope, speech difficulty, numbness and headaches.  Hematological: Negative for adenopathy. Does not bruise/bleed easily.  Psychiatric/Behavioral: Negative for behavioral problems and dysphoric mood. The patient is not nervous/anxious.        Objective:   Physical Exam  Constitutional: He is oriented to person, place, and time. He appears well-developed and well-nourished. No distress.       Weak frail but in no acute distress,  nasal cannula oxygen in place  HENT:  Head: Normocephalic.  Right Ear: External ear normal.  Left Ear: External ear normal.  Eyes: Conjunctivae normal and EOM are normal.  Neck: Normal range of motion.  Cardiovascular: Normal rate and normal heart sounds.        Pulse rate 95 and irregular  Pulmonary/Chest: Effort normal. No respiratory distress. He has rales.       Rales and rhonchi involving the left lower  hemithorax O2 saturation 95%  Abdominal: Bowel sounds are normal.  Musculoskeletal: Normal range of motion. He exhibits no edema and no tenderness.  Neurological: He is alert and oriented to person, place, and time.  Psychiatric: He has a normal mood and affect. His behavior is normal.          Assessment & Plan:   COPD Probable aspiration pneumonia although left lower lobe pneumonia somewhat atypical Hypertension Chronic systolic heart failure  Options were discussed including hospital admission he wishes to defer unless there is clinical worsening. He does have home O2 and nebulizer treatments will place on Levaquin 500 milligrams daily for 10 days. He will report to the hospital if any clinical worsening. He can continue to monitor home  oxygen levels

## 2012-02-14 ENCOUNTER — Encounter (HOSPITAL_COMMUNITY): Payer: Self-pay | Admitting: Emergency Medicine

## 2012-02-14 ENCOUNTER — Emergency Department (HOSPITAL_COMMUNITY): Payer: Medicare Other

## 2012-02-14 ENCOUNTER — Inpatient Hospital Stay (HOSPITAL_COMMUNITY)
Admission: EM | Admit: 2012-02-14 | Discharge: 2012-02-16 | DRG: 178 | Disposition: A | Discharge status: Home Health | Payer: Medicare Other | Attending: Internal Medicine | Admitting: Internal Medicine

## 2012-02-14 DIAGNOSIS — J9610 Chronic respiratory failure, unspecified whether with hypoxia or hypercapnia: Secondary | ICD-10-CM | POA: Diagnosis present

## 2012-02-14 DIAGNOSIS — J9611 Chronic respiratory failure with hypoxia: Secondary | ICD-10-CM

## 2012-02-14 DIAGNOSIS — R300 Dysuria: Secondary | ICD-10-CM

## 2012-02-14 DIAGNOSIS — I4891 Unspecified atrial fibrillation: Secondary | ICD-10-CM

## 2012-02-14 DIAGNOSIS — J4489 Other specified chronic obstructive pulmonary disease: Secondary | ICD-10-CM

## 2012-02-14 DIAGNOSIS — I6350 Cerebral infarction due to unspecified occlusion or stenosis of unspecified cerebral artery: Secondary | ICD-10-CM

## 2012-02-14 DIAGNOSIS — J449 Chronic obstructive pulmonary disease, unspecified: Secondary | ICD-10-CM

## 2012-02-14 DIAGNOSIS — K219 Gastro-esophageal reflux disease without esophagitis: Secondary | ICD-10-CM

## 2012-02-14 DIAGNOSIS — R609 Edema, unspecified: Secondary | ICD-10-CM

## 2012-02-14 DIAGNOSIS — N401 Enlarged prostate with lower urinary tract symptoms: Secondary | ICD-10-CM

## 2012-02-14 DIAGNOSIS — R0902 Hypoxemia: Secondary | ICD-10-CM | POA: Diagnosis present

## 2012-02-14 DIAGNOSIS — R5383 Other fatigue: Secondary | ICD-10-CM | POA: Diagnosis present

## 2012-02-14 DIAGNOSIS — E039 Hypothyroidism, unspecified: Secondary | ICD-10-CM

## 2012-02-14 DIAGNOSIS — R079 Chest pain, unspecified: Secondary | ICD-10-CM

## 2012-02-14 DIAGNOSIS — R0600 Dyspnea, unspecified: Secondary | ICD-10-CM

## 2012-02-14 DIAGNOSIS — R269 Unspecified abnormalities of gait and mobility: Secondary | ICD-10-CM

## 2012-02-14 DIAGNOSIS — Z9981 Dependence on supplemental oxygen: Secondary | ICD-10-CM

## 2012-02-14 DIAGNOSIS — J441 Chronic obstructive pulmonary disease with (acute) exacerbation: Secondary | ICD-10-CM

## 2012-02-14 DIAGNOSIS — I739 Peripheral vascular disease, unspecified: Secondary | ICD-10-CM

## 2012-02-14 DIAGNOSIS — I2510 Atherosclerotic heart disease of native coronary artery without angina pectoris: Secondary | ICD-10-CM

## 2012-02-14 DIAGNOSIS — N138 Other obstructive and reflux uropathy: Secondary | ICD-10-CM

## 2012-02-14 DIAGNOSIS — K279 Peptic ulcer, site unspecified, unspecified as acute or chronic, without hemorrhage or perforation: Secondary | ICD-10-CM

## 2012-02-14 DIAGNOSIS — M4800 Spinal stenosis, site unspecified: Secondary | ICD-10-CM

## 2012-02-14 DIAGNOSIS — R1310 Dysphagia, unspecified: Secondary | ICD-10-CM

## 2012-02-14 DIAGNOSIS — R339 Retention of urine, unspecified: Secondary | ICD-10-CM

## 2012-02-14 DIAGNOSIS — R531 Weakness: Secondary | ICD-10-CM

## 2012-02-14 DIAGNOSIS — I5022 Chronic systolic (congestive) heart failure: Secondary | ICD-10-CM

## 2012-02-14 DIAGNOSIS — R5381 Other malaise: Secondary | ICD-10-CM

## 2012-02-14 DIAGNOSIS — J690 Pneumonitis due to inhalation of food and vomit: Principal | ICD-10-CM

## 2012-02-14 DIAGNOSIS — M954 Acquired deformity of chest and rib: Secondary | ICD-10-CM

## 2012-02-14 DIAGNOSIS — Z8673 Personal history of transient ischemic attack (TIA), and cerebral infarction without residual deficits: Secondary | ICD-10-CM

## 2012-02-14 DIAGNOSIS — R110 Nausea: Secondary | ICD-10-CM

## 2012-02-14 DIAGNOSIS — E871 Hypo-osmolality and hyponatremia: Secondary | ICD-10-CM

## 2012-02-14 DIAGNOSIS — I10 Essential (primary) hypertension: Secondary | ICD-10-CM

## 2012-02-14 DIAGNOSIS — M1990 Unspecified osteoarthritis, unspecified site: Secondary | ICD-10-CM

## 2012-02-14 LAB — STREP PNEUMONIAE URINARY ANTIGEN: Strep Pneumo Urinary Antigen: NEGATIVE

## 2012-02-14 LAB — CBC WITH DIFFERENTIAL/PLATELET
Basophils Absolute: 0 10*3/uL (ref 0.0–0.1)
Basophils Relative: 0 % (ref 0–1)
Eosinophils Relative: 0 % (ref 0–5)
Lymphocytes Relative: 22 % (ref 12–46)
MCHC: 34.1 g/dL (ref 30.0–36.0)
Neutro Abs: 3.8 10*3/uL (ref 1.7–7.7)
Platelets: 146 10*3/uL — ABNORMAL LOW (ref 150–400)
RDW: 13.2 % (ref 11.5–15.5)
WBC: 5.2 10*3/uL (ref 4.0–10.5)

## 2012-02-14 LAB — BASIC METABOLIC PANEL
CO2: 30 mEq/L (ref 19–32)
Calcium: 8.9 mg/dL (ref 8.4–10.5)
Chloride: 92 mEq/L — ABNORMAL LOW (ref 96–112)
Creatinine, Ser: 0.6 mg/dL (ref 0.50–1.35)
GFR calc Af Amer: >90 mL/min (ref >90)
Sodium: 129 mEq/L — ABNORMAL LOW (ref 135–145)

## 2012-02-14 MED ORDER — ACETAMINOPHEN 650 MG RE SUPP: 650.0000 mg | Freq: Four times a day (QID) | RECTAL | Status: DC | PRN

## 2012-02-14 MED ORDER — SODIUM CHLORIDE 0.9 % IV SOLN
3.0000 g | Freq: Once | INTRAVENOUS | Status: AC
  Administered 2012-02-14: 3 g via INTRAVENOUS
  Filled 2012-02-14: qty 3

## 2012-02-14 MED ORDER — LEVOTHYROXINE SODIUM 50 MCG PO TABS
50.0000 ug | ORAL_TABLET | Freq: Every day | ORAL | Status: DC
  Administered 2012-02-15 – 2012-02-16 (×2): 50 ug via ORAL
  Filled 2012-02-14 (×3): qty 1

## 2012-02-14 MED ORDER — SODIUM CHLORIDE 0.9 % IV SOLN
3.0000 g | Freq: Three times a day (TID) | INTRAVENOUS | Status: DC
  Administered 2012-02-14 – 2012-02-15 (×3): 3 g via INTRAVENOUS
  Filled 2012-02-14 (×4): qty 3

## 2012-02-14 MED ORDER — BUDESONIDE-FORMOTEROL FUMARATE 160-4.5 MCG/ACT IN AERO
2.0000 | INHALATION_SPRAY | Freq: Two times a day (BID) | RESPIRATORY_TRACT | Status: DC
  Administered 2012-02-14 – 2012-02-16 (×4): 2 via RESPIRATORY_TRACT
  Filled 2012-02-14: qty 6

## 2012-02-14 MED ORDER — SENNOSIDES-DOCUSATE SODIUM 8.6-50 MG PO TABS
1.0000 | ORAL_TABLET | Freq: Every evening | ORAL | Status: DC | PRN
  Filled 2012-02-14: qty 1

## 2012-02-14 MED ORDER — SODIUM CHLORIDE 0.9 % IV SOLN
INTRAVENOUS | Status: AC
  Administered 2012-02-14: 16:00:00 via INTRAVENOUS

## 2012-02-14 MED ORDER — FUROSEMIDE 20 MG PO TABS
20.0000 mg | ORAL_TABLET | Freq: Every day | ORAL | Status: DC
  Administered 2012-02-14 – 2012-02-16 (×3): 20 mg via ORAL
  Filled 2012-02-14 (×3): qty 1

## 2012-02-14 MED ORDER — LORAZEPAM 0.5 MG PO TABS: 0.5000 mg | ORAL_TABLET | Freq: Three times a day (TID) | ORAL | Status: DC | PRN

## 2012-02-14 MED ORDER — SODIUM CHLORIDE 0.9 % IV SOLN: INTRAVENOUS | Status: AC

## 2012-02-14 MED ORDER — SPIRONOLACTONE 25 MG PO TABS
25.0000 mg | ORAL_TABLET | Freq: Two times a day (BID) | ORAL | Status: DC
  Administered 2012-02-14 – 2012-02-16 (×4): 25 mg via ORAL
  Filled 2012-02-14 (×6): qty 1

## 2012-02-14 MED ORDER — BIOTENE DRY MOUTH MT LIQD
15.0000 mL | Freq: Two times a day (BID) | OROMUCOSAL | Status: DC
  Administered 2012-02-14 – 2012-02-16 (×3): 15 mL via OROMUCOSAL

## 2012-02-14 MED ORDER — ACETAMINOPHEN 325 MG PO TABS: 650.0000 mg | ORAL_TABLET | Freq: Four times a day (QID) | ORAL | Status: DC | PRN

## 2012-02-14 MED ORDER — ALBUTEROL SULFATE (5 MG/ML) 0.5% IN NEBU
2.5000 mg | INHALATION_SOLUTION | RESPIRATORY_TRACT | Status: DC | PRN
  Filled 2012-02-14: qty 0.5

## 2012-02-14 MED ORDER — IPRATROPIUM BROMIDE 0.02 % IN SOLN
250.0000 ug | Freq: Two times a day (BID) | RESPIRATORY_TRACT | Status: DC
Start: 2012-02-14 — End: 2012-02-15
  Administered 2012-02-14: 260 ug via RESPIRATORY_TRACT
  Administered 2012-02-15: 0.5 ug via RESPIRATORY_TRACT
  Filled 2012-02-14 (×4): qty 2.5

## 2012-02-14 MED ORDER — SODIUM CHLORIDE 0.9 % IV SOLN
INTRAVENOUS | Status: DC
  Administered 2012-02-14: 12:00:00 via INTRAVENOUS

## 2012-02-14 MED ORDER — ENOXAPARIN SODIUM 40 MG/0.4ML ~~LOC~~ SOLN
40.0000 mg | SUBCUTANEOUS | Status: DC
  Administered 2012-02-14 – 2012-02-15 (×2): 40 mg via SUBCUTANEOUS
  Filled 2012-02-14 (×3): qty 0.4

## 2012-02-14 MED ORDER — ONDANSETRON HCL 4 MG PO TABS: 4.0000 mg | ORAL_TABLET | Freq: Four times a day (QID) | ORAL | Status: DC | PRN

## 2012-02-14 MED ORDER — CLOPIDOGREL BISULFATE 75 MG PO TABS
75.0000 mg | ORAL_TABLET | Freq: Every day | ORAL | Status: DC
  Administered 2012-02-14 – 2012-02-16 (×3): 75 mg via ORAL
  Filled 2012-02-14 (×3): qty 1

## 2012-02-14 MED ORDER — OXYCODONE HCL 5 MG PO TABS: 5.0000 mg | ORAL_TABLET | ORAL | Status: DC | PRN

## 2012-02-14 MED ORDER — FLUTICASONE PROPIONATE 50 MCG/ACT NA SUSP
2.0000 | Freq: Every day | NASAL | Status: DC
  Administered 2012-02-15 – 2012-02-16 (×2): 2 via NASAL
  Filled 2012-02-14: qty 16

## 2012-02-14 MED ORDER — DUTASTERIDE 0.5 MG PO CAPS
0.5000 mg | ORAL_CAPSULE | Freq: Every day | ORAL | Status: DC
  Administered 2012-02-14 – 2012-02-16 (×3): 0.5 mg via ORAL
  Filled 2012-02-14 (×3): qty 1

## 2012-02-14 MED ORDER — CARVEDILOL 3.125 MG PO TABS
3.1250 mg | ORAL_TABLET | Freq: Two times a day (BID) | ORAL | Status: DC
  Administered 2012-02-15 – 2012-02-16 (×3): 3.125 mg via ORAL
  Filled 2012-02-14 (×6): qty 1

## 2012-02-14 MED ORDER — DIGOXIN 125 MCG PO TABS
125.0000 ug | ORAL_TABLET | Freq: Every day | ORAL | Status: DC
  Administered 2012-02-14 – 2012-02-16 (×3): 125 ug via ORAL
  Filled 2012-02-14 (×3): qty 1

## 2012-02-14 MED ORDER — ONDANSETRON HCL 4 MG/2ML IJ SOLN: 4.0000 mg | Freq: Four times a day (QID) | INTRAMUSCULAR | Status: DC | PRN

## 2012-02-14 MED ORDER — PANTOPRAZOLE SODIUM 40 MG PO TBEC
40.0000 mg | DELAYED_RELEASE_TABLET | Freq: Every day | ORAL | Status: DC
  Administered 2012-02-14 – 2012-02-16 (×3): 40 mg via ORAL
  Filled 2012-02-14 (×3): qty 1

## 2012-02-14 MED ORDER — TAMSULOSIN HCL 0.4 MG PO CAPS
0.4000 mg | ORAL_CAPSULE | Freq: Every day | ORAL | Status: DC
  Administered 2012-02-14 – 2012-02-16 (×3): 0.4 mg via ORAL
  Filled 2012-02-14 (×3): qty 1

## 2012-02-14 NOTE — Progress Notes (Signed)
ANTIBIOTIC CONSULT NOTE - INITIAL  Pharmacy Consult for Unasyn and may adjust abx for renal fxn. Indication: suspected aspiration pneumonia  Allergies  Allergen Reactions  . Bleph-10 (Sulfacetamide Sodium)   . Procaine Hcl     REACTION: syncope    Patient Measurements:   Adjusted Body Weight:  Vital Signs: Temp: 97.7 F (36.5 C) (11/10 1137) Temp src: Oral (11/10 1137) BP: 116/75 mmHg (11/10 1300) Pulse Rate: 88  (11/10 1137) Intake/Output from previous day:   Intake/Output from this shift:    Labs:  Basename 02/14/12 1227  WBC 5.2  HGB 12.4*  PLT 146*  LABCREA --  CREATININE 0.60   The CrCl is unknown because both a height and weight (above a minimum accepted value) are required for this calculation. No results found for this basename: VANCOTROUGH:2,VANCOPEAK:2,VANCORANDOM:2,GENTTROUGH:2,GENTPEAK:2,GENTRANDOM:2,TOBRATROUGH:2,TOBRAPEAK:2,TOBRARND:2,AMIKACINPEAK:2,AMIKACINTROU:2,AMIKACIN:2, in the last 72 hours   Microbiology: No results found for this or any previous visit (from the past 720 hour(s)).  Medical History: Past Medical History  Diagnosis Date  . Abnormality of gait 09/28/2008  . Acquired deformity of chest and rib 01/14/2009  . Atrial fibrillation 09/30/2006  . BENIGN PROSTATIC HYPERTROPHY, WITH URINARY OBSTRUCTION 09/04/2009  . CHRONIC OBSTRUCTIVE PULMONARY DISEASE, ACUTE EXACERBATION 03/24/2007  . Chronic systolic heart failure 08/06/2008  . COPD 09/30/2006  . CORONARY ARTERY DISEASE 10/01/2006  . GERD 04/19/2007  . HYPERTENSION 09/30/2006  . HYPOTHYROIDISM 09/30/2006  . LASSITUDE 06/13/2007  . Osteoarth NOS-Unspec 09/30/2006  . PEPTIC ULCER DISEASE 09/30/2006  . PERIPHERAL VASCULAR DISEASE 09/30/2006  . SPINAL STENOSIS 08/21/2009  . STROKE 08/28/2008    Medications:  Anti-infectives     Start     Dose/Rate Route Frequency Ordered Stop   02/14/12 1330   Ampicillin-Sulbactam (UNASYN) 3 g in sodium chloride 0.9 % 100 mL IVPB        3 g 100 mL/hr over  60 Minutes Intravenous  Once 02/14/12 1326 02/14/12 1531         Assessment:  78yo M admitted with SOB. Recent food impaction 4 days ago.  Starting Unasyn empirically.  CrCl ~80.  Goal of Therapy:  Eradication of infection. Appropriate dosing for renal fxn and weight.  Plan:   Unasyn 3g IV q8h.  Follow up renal fxn and culture results.  Charolotte Eke, PharmD, pager [phone]. 02/14/2012,5:03 PM.

## 2012-02-14 NOTE — ED Notes (Signed)
PER EMS- pt picked up from home with c/o respiratory distress, reports that 'pt is not feeling good today and feeling worse'.  Dr requested that pt be seen here again. Pt was diagnosed with aspiration pneumonia x3 days ago due to food being lodged in throat.  Pt has hx of COPD and is on home oxygen 2L.  Alert and oriented, slightly lethargic on arrival to the ED.  EMS gave a total of 10mg  albuterol, .5 atrovent, and 125mg  solu medrol.  A 20g IV was placed in [facility].  Lung sounds improved after neb treatment.

## 2012-02-14 NOTE — ED Provider Notes (Signed)
History     CSN: [id number]  Arrival date & time 02/14/12  1115   First MD Initiated Contact with Patient 02/14/12 1157      Chief Complaint  Patient presents with  . Follow-up    Follow up pnemonia    (Consider location/radiation/quality/duration/timing/severity/associated sxs/prior treatment) The history is provided by the patient.   patient here complaining of shortness of breath. He called EMS and was given albuterol and Atrovent & Medrol. Recently seen for a food impaction 4 days ago and had to have removed. Diagnosed with aspiration pneumonia. Denies any fever or vomiting. Patient is much improved after the treatments via EMS.  Past Medical History  Diagnosis Date  . Abnormality of gait 09/28/2008  . Acquired deformity of chest and rib 01/14/2009  . Atrial fibrillation 09/30/2006  . BENIGN PROSTATIC HYPERTROPHY, WITH URINARY OBSTRUCTION 09/04/2009  . CHRONIC OBSTRUCTIVE PULMONARY DISEASE, ACUTE EXACERBATION 03/24/2007  . Chronic systolic heart failure 08/06/2008  . COPD 09/30/2006  . CORONARY ARTERY DISEASE 10/01/2006  . GERD 04/19/2007  . HYPERTENSION 09/30/2006  . HYPOTHYROIDISM 09/30/2006  . LASSITUDE 06/13/2007  . Osteoarth NOS-Unspec 09/30/2006  . PEPTIC ULCER DISEASE 09/30/2006  . PERIPHERAL VASCULAR DISEASE 09/30/2006  . SPINAL STENOSIS 08/21/2009  . STROKE 08/28/2008    Past Surgical History  Procedure Date  . Appendectomy   . Hernia repair   . Tonsillectomy   . Abdominal aortic aneurysm repair   . Transurethral resection of prostate   . Lumbar laminectomy   . Laryngoscopy and esophagoscopy 02/10/2012    Procedure: LARYNGOSCOPY AND ESOPHAGOSCOPY;  Surgeon: Flo Shanks, MD;  Location: [facility];  Service: ENT;  Laterality: N/A;  DIRECT LARYNGOSCOPY/REMOVAL FOREIGN BODY FROM ESOPHAGUS    No family history on file.  History  Substance Use Topics  . Smoking status: Former Smoker -- 0.8 packs/day for 20 years    Types: Cigarettes    Quit date: 04/06/1948  . Smokeless  tobacco: Never Used  . Alcohol Use: No      Review of Systems  All other systems reviewed and are negative.    Allergies  Bleph-10 and Procaine hcl  Home Medications   Current Outpatient Rx  Name  Route  Sig  Dispense  Refill  . BUDESONIDE-FORMOTEROL FUMARATE 160-4.5 MCG/ACT IN AERO   Inhalation   Inhale 2 puffs into the lungs 2 (two) times daily.   1 Inhaler   12   . CARVEDILOL 6.25 MG PO TABS   Oral   Take 3.125 mg by mouth 2 (two) times daily with a meal. One half tablet twice daily         . CLOPIDOGREL BISULFATE 75 MG PO TABS   Oral   Take 75 mg by mouth daily.         Marland Kitchen DIGOXIN 0.125 MG PO TABS   Oral   Take 125 mcg by mouth daily.         . DUTASTERIDE 0.5 MG PO CAPS   Oral   Take 1 capsule (0.5 mg total) by mouth daily.   50 capsule   3   . FLUTICASONE PROPIONATE 50 MCG/ACT NA SUSP   Nasal   Place 2 sprays into the nose daily.   1 g   0   . FUROSEMIDE 20 MG PO TABS   Oral   Take 1 tablet (20 mg total) by mouth daily.   90 tablet   3   . IPRATROPIUM BROMIDE 0.02 % IN SOLN   Nebulization  Take 250 mcg by nebulization 2 (two) times daily.         Marland Kitchen LEVOFLOXACIN 500 MG PO TABS   Oral   Take 1 tablet (500 mg total) by mouth daily.   10 tablet   0   . LEVOTHYROXINE SODIUM 50 MCG PO TABS   Oral   Take 50 mcg by mouth daily.         Marland Kitchen LORAZEPAM 0.5 MG PO TABS   Oral   Take 0.5 mg by mouth every 8 (eight) hours as needed. Anxiety         . PANTOPRAZOLE SODIUM 40 MG PO TBEC   Oral   Take 40 mg by mouth daily.         Marland Kitchen POTASSIUM CHLORIDE CRYS ER 10 MEQ PO TBCR   Oral   Take 10 mEq by mouth daily.         Marland Kitchen SPIRONOLACTONE 25 MG PO TABS   Oral   Take 25 mg by mouth 2 (two) times daily.         Marland Kitchen TAMSULOSIN HCL 0.4 MG PO CAPS   Oral   Take 0.4 mg by mouth daily.         . TRAMADOL HCL 50 MG PO TABS   Oral   Take 50 mg by mouth every 6 (six) hours as needed.           BP 138/72  Pulse 88  Temp 97.7 F  (36.5 C) (Oral)  Resp 19  SpO2 100%  Physical Exam  Nursing note and vitals reviewed. Constitutional: He is oriented to person, place, and time. He appears well-developed and well-nourished.  Non-toxic appearance. No distress.  HENT:  Head: Normocephalic and atraumatic.  Eyes: Conjunctivae normal, EOM and lids are normal. Pupils are equal, round, and reactive to light.  Neck: Normal range of motion. Neck supple. No tracheal deviation present. No mass present.  Cardiovascular: Normal rate, regular rhythm and normal heart sounds.  Exam reveals no gallop.   No murmur heard. Pulmonary/Chest: Effort normal. No stridor. No respiratory distress. He has decreased breath sounds. He has wheezes. He has no rhonchi. He has no rales.  Abdominal: Soft. Normal appearance and bowel sounds are normal. He exhibits no distension. There is no tenderness. There is no rebound and no CVA tenderness.  Musculoskeletal: Normal range of motion. He exhibits no edema and no tenderness.  Neurological: He is alert and oriented to person, place, and time. He has normal strength. No cranial nerve deficit or sensory deficit. GCS eye subscore is 4. GCS verbal subscore is 5. GCS motor subscore is 6.  Skin: Skin is warm and dry. No abrasion and no rash noted.  Psychiatric: He has a normal mood and affect. His speech is normal and behavior is normal.    ED Course  Procedures (including critical care time)   Labs Reviewed  CBC WITH DIFFERENTIAL  BASIC METABOLIC PANEL   No results found.   No diagnosis found.    MDM  Pt to be admitted for aspiration pneumonia        Toy Baker, MD 02/14/12 1413

## 2012-02-14 NOTE — H&P (Signed)
[facility]          History and Physical    PCP:   Rogelia Boga, MD   Chief Complaint:  SOB   HPI: Pleasant 77 y/o man who lives at home with a demented wife and a caregiver, comes to the hospital today via EMS 2/2 SOB. His PMH is significant for COPD on home oxygen at 2 L, HTN, prior CVA, CAD, hypothyroidism, among other things. He was recently in the hospital after choking on a piece of chicken that required endoscopic removal by ENT. He states he called the ambulance today because he developed sudden SOB. Was apparently wheezing upon arrival to the ED. Denies cough, fever, chills, CP or any other symptoms. He is found to have a PNA on CXR and we have been asked to admit him for further evaluation and management.  Allergies:   Allergies  Allergen Reactions  . Bleph-10 (Sulfacetamide Sodium)   . Procaine Hcl     REACTION: syncope      Past Medical History  Diagnosis Date  . Abnormality of gait 09/28/2008  . Acquired deformity of chest and rib 01/14/2009  . Atrial fibrillation 09/30/2006  . BENIGN PROSTATIC HYPERTROPHY, WITH URINARY OBSTRUCTION 09/04/2009  . CHRONIC OBSTRUCTIVE PULMONARY DISEASE, ACUTE EXACERBATION 03/24/2007  . Chronic systolic heart failure 08/06/2008  . COPD 09/30/2006  . CORONARY ARTERY DISEASE 10/01/2006  . GERD 04/19/2007  . HYPERTENSION 09/30/2006  . HYPOTHYROIDISM 09/30/2006  . LASSITUDE 06/13/2007  . Osteoarth NOS-Unspec 09/30/2006  . PEPTIC ULCER DISEASE 09/30/2006  . PERIPHERAL VASCULAR DISEASE 09/30/2006  . SPINAL STENOSIS 08/21/2009  . STROKE 08/28/2008    Past Surgical History  Procedure Date  . Appendectomy   . Hernia repair   . Tonsillectomy   . Abdominal aortic aneurysm repair   . Transurethral resection of prostate   . Lumbar laminectomy   . Laryngoscopy and esophagoscopy 02/10/2012    Procedure: LARYNGOSCOPY AND ESOPHAGOSCOPY;  Surgeon: Flo Shanks, MD;  Location: [facility];  Service: ENT;  Laterality: N/A;  DIRECT  LARYNGOSCOPY/REMOVAL FOREIGN BODY FROM ESOPHAGUS    Prior to Admission medications   Medication Sig Start Date End Date Taking? Authorizing Provider  budesonide-formoterol (SYMBICORT) 160-4.5 MCG/ACT inhaler Inhale 2 puffs into the lungs 2 (two) times daily. 07/13/11 07/12/12  Nhan Art, DO  carvedilol (COREG) 6.25 MG tablet Take 3.125 mg by mouth 2 (two) times daily with a meal. One half tablet twice daily 10/06/11   Gordy Savers, MD  clopidogrel (PLAVIX) 75 MG tablet Take 75 mg by mouth daily.    Historical Provider, MD  digoxin (LANOXIN) 0.125 MG tablet Take 125 mcg by mouth daily.    Historical Provider, MD  dutasteride (AVODART) 0.5 MG capsule Take 1 capsule (0.5 mg total) by mouth daily. 07/13/11   Oluwatobi Art, DO  fluticasone (FLONASE) 50 MCG/ACT nasal spray Place 2 sprays into the nose daily. 07/13/11 07/12/12  Ashyr Art, DO  furosemide (LASIX) 20 MG tablet Take 1 tablet (20 mg total) by mouth daily. 07/13/11   Jese Art, DO  ipratropium (ATROVENT) 0.02 % nebulizer solution Take 250 mcg by nebulization 2 (two) times daily.    Historical Provider, MD  levofloxacin (LEVAQUIN) 500 MG tablet Take 1 tablet (500 mg total) by mouth daily. 02/12/12   Gordy Savers, MD  levothyroxine (SYNTHROID, LEVOTHROID) 50 MCG tablet Take 50 mcg by mouth daily.    Historical Provider, MD  LORazepam (ATIVAN) 0.5 MG tablet Take 0.5 mg by  mouth every 8 (eight) hours as needed. Anxiety 11/05/11   Gordy Savers, MD  pantoprazole (PROTONIX) 40 MG tablet Take 40 mg by mouth daily.    Historical Provider, MD  potassium chloride (K-DUR,KLOR-CON) 10 MEQ tablet Take 10 mEq by mouth daily.    Historical Provider, MD  spironolactone (ALDACTONE) 25 MG tablet Take 25 mg by mouth 2 (two) times daily.    Historical Provider, MD  Tamsulosin HCl (FLOMAX) 0.4 MG CAPS Take 0.4 mg by mouth daily.    Historical Provider, MD  traMADol (ULTRAM) 50 MG tablet Take 50 mg by mouth every 6 (six) hours as needed.     Historical Provider, MD    Social History:  reports that he quit smoking about 63 years ago. His smoking use included Cigarettes. He has a 16 pack-year smoking history. He has never used smokeless tobacco. He reports that he does not drink alcohol or use illicit drugs.  History reviewed. No pertinent family history.  Review of Systems:  Constitutional: Denies fever, chills, diaphoresis and fatigue.  HEENT: Denies photophobia, eye pain, redness, hearing loss, ear pain, congestion, sore throat, rhinorrhea, sneezing, mouth sores, trouble swallowing, neck pain, neck stiffness and tinnitus.   Respiratory: Denies cough, chest tightness. Cardiovascular: Denies chest pain, palpitations and leg swelling.  Gastrointestinal: Denies nausea, vomiting, abdominal pain, diarrhea, constipation, blood in stool and abdominal distention.  Genitourinary: Denies dysuria, urgency, frequency, hematuria, flank pain and difficulty urinating.  Musculoskeletal: Denies myalgias, back pain, joint swelling, arthralgias. Skin: Denies pallor, rash and wound.  Neurological: Denies dizziness, seizures, syncope, weakness, light-headedness, numbness and headaches.  Hematological: Denies adenopathy. Easy bruising, personal or family bleeding history  Psychiatric/Behavioral: Denies suicidal ideation, mood changes, confusion, nervousness, sleep disturbance and agitation   Physical Exam: Blood pressure 116/75, pulse 88, temperature 97.7 F (36.5 C), temperature source Oral, resp. rate 13, SpO2 99.00%. Gen: AA Ox3, cachectic elderly man. HEENT: [place]/AT, PERRL, dry mucous membranes. Neck: supple, no JVD, no LAD, no bruits, no goiter. CV: RRR, no M/R/G. Lungs: CTA B. Abd: Soft, nontender, nondistended, +BS, no masses or organomegaly noted. Ext: no C/C/E, +pedal pulses. Neuro: non-focal. I have not ambulated him. Skin: no rashes identified on exam.  Labs on Admission:  Results for orders placed during the hospital encounter of  02/14/12 (from the past 48 hour(s))  CBC WITH DIFFERENTIAL     Status: Abnormal   Collection Time   02/14/12 12:27 PM      Component Value Range Comment   WBC 5.2  4.0 - 10.5 K/uL    RBC 4.29  4.22 - 5.81 MIL/uL    Hemoglobin 12.4 (*) 13.0 - 17.0 g/dL    HCT 96.0 (*) 45.4 - 52.0 %    MCV 84.8  78.0 - 100.0 fL    MCH 28.9  26.0 - 34.0 pg    MCHC 34.1  30.0 - 36.0 g/dL    RDW 09.8  11.9 - 14.7 %    Platelets 146 (*) 150 - 400 K/uL    Neutrophils Relative 74  43 - 77 %    Neutro Abs 3.8  1.7 - 7.7 K/uL    Lymphocytes Relative 22  12 - 46 %    Lymphs Abs 1.1  0.7 - 4.0 K/uL    Monocytes Relative 4  3 - 12 %    Monocytes Absolute 0.2  0.1 - 1.0 K/uL    Eosinophils Relative 0  0 - 5 %    Eosinophils Absolute 0.0  0.0 -  0.7 K/uL    Basophils Relative 0  0 - 1 %    Basophils Absolute 0.0  0.0 - 0.1 K/uL   BASIC METABOLIC PANEL     Status: Abnormal   Collection Time   02/14/12 12:27 PM      Component Value Range Comment   Sodium 129 (*) 135 - 145 mEq/L    Potassium 4.1  3.5 - 5.1 mEq/L    Chloride 92 (*) 96 - 112 mEq/L    CO2 30  19 - 32 mEq/L    Glucose, Bld 132 (*) 70 - 99 mg/dL    BUN 15  6 - 23 mg/dL    Creatinine, Ser 4.09  0.50 - 1.35 mg/dL    Calcium 8.9  8.4 - 81.1 mg/dL    GFR calc non Af Amer 85 (*) >90 mL/min    GFR calc Af Amer >90  >90 mL/min     Radiological Exams on Admission: Dg Chest 2 View  02/14/2012  *RADIOLOGY REPORT*  Clinical Data: Chest pain and shortness of breath.  CHEST - 2 VIEW  Comparison: 02/10/2012  Findings: Stable COPD.  Some increased density at the left lung base noted obscuring part of the left hemidiaphragm.  This may represent atelectasis or pneumonia.  No pulmonary edema is identified.  There likely is a small amount of left pleural fluid.  IMPRESSION: Atelectasis versus pneumonia at the left lung base with associated small left pleural effusion.   Original Report Authenticated By: Irish Lack, M.D.     Assessment/Plan Principal  Problem:  *Aspiration pneumonia Active Problems:  Weakness generalized  HYPOTHYROIDISM  HYPERTENSION  STROKE  Chronic respiratory failure with hypoxia  COPD (chronic obstructive pulmonary disease)  Hyponatremia    Aspiration Pneumonia -CURB 65 score is only 1 (on account of his age). -Although he could possibly be treated as an OP based on this, given his advanced age, frail state and not a lot of home support, I believe it is prudent to admit him to the hospital to initiate his treatment. -Has been started on unasyn by the ED which I will elect to continue. -Given his recent choking episode and resultant aspiration PNA, I believe it is prudent to obtain a ST swallow evaluation. -Will place on a Dysphagia 3 diet, pending ST recommendations.  Chronic Respiratory Failure -On account of his COPD. -Continue Oxygen at 2 L. -Is not actively wheezing at present, do not believe we need to treat this is an acute exacerbation with steroids. -Continue symbicort and PRN albuterol nebs.  Hyponatremia -Appears to be chronic. -No further workup will be initiated at this time.  Hypothyroidism -Continue synthroid.  H/o CVA -Continue plavix.  DVT Prophylaxis -Lovenox.  Disposition -Pending results of ST/PT/OT evaluations. -Anticipate DC in 1-2 days given his current clinical stability.   Time Spent on Admission: 70 minutes.  Chaya Jan [facility] Pager: [phone] 02/14/2012, 5:06 PM

## 2012-02-14 NOTE — ED Notes (Signed)
WNU:UV25<DG> Expected date:02/14/12<BR> Expected time:11:18 AM<BR> Means of arrival:<BR> Comments:<BR> Pneumonia follow up

## 2012-02-14 NOTE — Progress Notes (Signed)
Pt. Arrived to floor from ED. Alert and oriented x 4. Pt. Gait unsteady. Instructed to call for assistance when getting out of bed.  Bed alarm put on. No resp. Distress noted. Continue with admission.

## 2012-02-15 LAB — LEGIONELLA ANTIGEN, URINE

## 2012-02-15 LAB — BASIC METABOLIC PANEL
Calcium: 8.9 mg/dL (ref 8.4–10.5)
Chloride: 94 mEq/L — ABNORMAL LOW (ref 96–112)
Creatinine, Ser: 0.54 mg/dL (ref 0.50–1.35)
GFR calc Af Amer: >90 mL/min (ref >90)

## 2012-02-15 LAB — CBC
HCT: 33.6 % — ABNORMAL LOW (ref 39.0–52.0)
Platelets: 171 10*3/uL (ref 150–400)
RDW: 13.1 % (ref 11.5–15.5)
WBC: 7 10*3/uL (ref 4.0–10.5)

## 2012-02-15 MED ORDER — ENSURE COMPLETE PO LIQD
237.0000 mL | Freq: Two times a day (BID) | ORAL | Status: DC
  Administered 2012-02-16: 237 mL via ORAL

## 2012-02-15 MED ORDER — IPRATROPIUM BROMIDE 0.02 % IN SOLN
0.2500 mg | Freq: Two times a day (BID) | RESPIRATORY_TRACT | Status: DC
  Administered 2012-02-15: 0.26 mg via RESPIRATORY_TRACT
  Administered 2012-02-16: 0.5 mg via RESPIRATORY_TRACT
  Filled 2012-02-15: qty 2.5

## 2012-02-15 MED ORDER — CLINDAMYCIN HCL 300 MG PO CAPS
300.0000 mg | ORAL_CAPSULE | Freq: Four times a day (QID) | ORAL | Status: DC
  Administered 2012-02-15 – 2012-02-16 (×4): 300 mg via ORAL
  Filled 2012-02-15 (×7): qty 1

## 2012-02-15 NOTE — Progress Notes (Signed)
INITIAL ADULT NUTRITION ASSESSMENT Date: 02/15/2012   Time: 5:14 PM Reason for Assessment: Nutrition risk   INTERVENTION: Ensure Complete BID - nursing to thicken to appropriate consistency. Downgraded diet to dysphagia 2, nectar thick, per pt request, as meats on dysphagia 3 texture were too difficult for him to chew. Will monitor.   Pt meets criteria for severe malnutrition of chronic illness AEB pt with cachexia with severe muscle wasting and fat loss throughout body.   ASSESSMENT: Male 78 y.o.  Dx: Aspiration pneumonia  Food/Nutrition Related Hx: Pt reports eating excellent PTA, 3 meals/day with 1 Ensure daily. Pt drinks thickened liquids "most of the time" PTA, sometimes drinks thin liquids. Pt reports weighing 178 pounds a few years ago before he was put on Coumadin which caused him to lose a lot of weight. Pt reports the medication "didn't agree with him". Pt reports stable weight in the past few months at around 120 pounds. Pt without teeth and states his dentures have not been fitting well. Pt had food impaction with removal on 02/10/12. Pt had SLP evaluation today and noted pt with chronic dysphagia with known silent aspiration of thin liquids. Pt reports doing well with dysphagia 3 nectar diet and consumed 100% of lunch and Ensure today.   Hx:  Past Medical History  Diagnosis Date  . Abnormality of gait 09/28/2008  . Acquired deformity of chest and rib 01/14/2009  . Atrial fibrillation 09/30/2006  . BENIGN PROSTATIC HYPERTROPHY, WITH URINARY OBSTRUCTION 09/04/2009  . CHRONIC OBSTRUCTIVE PULMONARY DISEASE, ACUTE EXACERBATION 03/24/2007  . Chronic systolic heart failure 08/06/2008  . COPD 09/30/2006  . CORONARY ARTERY DISEASE 10/01/2006  . GERD 04/19/2007  . HYPERTENSION 09/30/2006  . HYPOTHYROIDISM 09/30/2006  . LASSITUDE 06/13/2007  . Osteoarth NOS-Unspec 09/30/2006  . PEPTIC ULCER DISEASE 09/30/2006  . PERIPHERAL VASCULAR DISEASE 09/30/2006  . SPINAL STENOSIS 08/21/2009  . STROKE  08/28/2008   Related Meds:  Scheduled Meds:   . [COMPLETED] sodium chloride   Intravenous STAT  . antiseptic oral rinse  15 mL Mouth Rinse BID  . budesonide-formoterol  2 puff Inhalation BID  . carvedilol  3.125 mg Oral BID WC  . clindamycin  300 mg Oral Q6H  . clopidogrel  75 mg Oral Daily  . digoxin  125 mcg Oral Daily  . dutasteride  0.5 mg Oral Daily  . enoxaparin (LOVENOX) injection  40 mg Subcutaneous Q24H  . fluticasone  2 spray Each Nare Daily  . furosemide  20 mg Oral Daily  . ipratropium  260 mcg Nebulization BID  . levothyroxine  50 mcg Oral QAC breakfast  . pantoprazole  40 mg Oral Daily  . spironolactone  25 mg Oral BID  . Tamsulosin HCl  0.4 mg Oral Daily  . [DISCONTINUED] ampicillin-sulbactam (UNASYN) IV  3 g Intravenous Q8H   Continuous Infusions:   . [EXPIRED] sodium chloride     PRN Meds:.acetaminophen, acetaminophen, albuterol, LORazepam, ondansetron (ZOFRAN) IV, ondansetron, oxyCODONE, senna-docusate  Ht: 5\' 11"  (180.3 cm)  Wt: 118 lb 12.8 oz (53.887 kg)  Ideal Wt: 172 lb % Ideal Wt: 69  Usual Wt: 178 lb (several years ago per pt report) % Usual Wt: 66  Wt Readings from Last 10 Encounters:  02/14/12 118 lb 12.8 oz (53.887 kg)  01/04/12 117 lb (53.071 kg)  09/18/11 118 lb (53.524 kg)  08/21/11 118 lb (53.524 kg)  07/24/11 120 lb (54.432 kg)  07/12/11 113 lb 5.1 oz (51.4 kg)  06/09/11 118 lb (53.524 kg)  05/29/11 118  lb (53.524 kg)  05/21/11 119 lb (53.978 kg)  04/29/11 116 lb (52.617 kg)     Body mass index is 16.57 kg/(m^2).   Labs:  CMP     Component Value Date/Time   NA 128* 02/15/2012 0420   K 4.2 02/15/2012 0420   CL 94* 02/15/2012 0420   CO2 25 02/15/2012 0420   GLUCOSE 187* 02/15/2012 0420   BUN 18 02/15/2012 0420   CREATININE 0.54 02/15/2012 0420   CALCIUM 8.9 02/15/2012 0420   PROT 6.6 10/06/2011 1153   ALBUMIN 3.3* 10/06/2011 1153   AST 23 10/06/2011 1153   ALT 14 10/06/2011 1153   ALKPHOS 58 10/06/2011 1153   BILITOT 0.5  10/06/2011 1153   GFRNONAA 89* 02/15/2012 0420   GFRAA >90 02/15/2012 0420    Intake/Output Summary (Last 24 hours) at 02/15/12 1722 Last data filed at 02/15/12 1345  Gross per 24 hour  Intake   1567 ml  Output    150 ml  Net   1417 ml   Last BM - PTA  Diet Order: Dysphagia 3, nectar thick   IVF:    [EXPIRED] sodium chloride    Estimated Nutritional Needs:   Kcal:1750-1950 Protein:80-110g Fluid:1.7-1.9L  NUTRITION DIAGNOSIS: -Swallowing difficulty (NI-1.1).  Status: Ongoing  RELATED TO: sensorimotor deficits  AS EVIDENCE BY: SLP evaluation  MONITORING/EVALUATION(Goals): Pt to consume 100% of meals/supplements.   EDUCATION NEEDS: -No education needs identified at this time  Dietitian #: [phone]  DOCUMENTATION CODES Per approved criteria  -Severe malnutrition in the context of chronic illness -Underweight    Marshall Cork 02/15/2012, 5:14 PM

## 2012-02-15 NOTE — Evaluation (Signed)
Clinical/Bedside Swallow Evaluation Patient Details  Name: [name] MRN: [id number] Date of Birth: [birth year]  Today's Date: 02/15/2012 Time: 0865-7846 SLP Time Calculation (min): 76 min  Past Medical History:  Past Medical History  Diagnosis Date  . Abnormality of gait 09/28/2008  . Acquired deformity of chest and rib 01/14/2009  . Atrial fibrillation 09/30/2006  . BENIGN PROSTATIC HYPERTROPHY, WITH URINARY OBSTRUCTION 09/04/2009  . CHRONIC OBSTRUCTIVE PULMONARY DISEASE, ACUTE EXACERBATION 03/24/2007  . Chronic systolic heart failure 08/06/2008  . COPD 09/30/2006  . CORONARY ARTERY DISEASE 10/01/2006  . GERD 04/19/2007  . HYPERTENSION 09/30/2006  . HYPOTHYROIDISM 09/30/2006  . LASSITUDE 06/13/2007  . Osteoarth NOS-Unspec 09/30/2006  . PEPTIC ULCER DISEASE 09/30/2006  . PERIPHERAL VASCULAR DISEASE 09/30/2006  . SPINAL STENOSIS 08/21/2009  . STROKE 08/28/2008   Past Surgical History:  Past Surgical History  Procedure Date  . Appendectomy   . Hernia repair   . Tonsillectomy   . Abdominal aortic aneurysm repair   . Transurethral resection of prostate   . Lumbar laminectomy   . Laryngoscopy and esophagoscopy 02/10/2012    Procedure: LARYNGOSCOPY AND ESOPHAGOSCOPY;  Surgeon: Flo Shanks, MD;  Location: [facility];  Service: ENT;  Laterality: N/A;  DIRECT LARYNGOSCOPY/REMOVAL FOREIGN BODY FROM ESOPHAGUS   HPI:  78 yo male adm to [facility] with shortness of breath, fever - tx'd for pna.  CXR 02/14/12 Atelectasis versus pneumonia at the left lung base with associated small left pleural effusion. Pt recently admitted after choking on chicken and required surgical removal of chicken from pyriform sinuses.  At home, pt presented with AMS, cough, and fever. admitted.  SLP swallow  eval ordered.  Pt with surgical hx of cervical fusion, C4-C5) and CVA without subsequent dysphagia per pt.    Assessment / Plan / Recommendation Clinical Impression  Pt has a chronic dysphagia with known silent  aspiration of thin liquids due to sensorimotor deficits.  Per pt, "choking" on chicken was an isolated incident and he normally eats/drinks well.  Pt states he consumes thickened drinks at home "most of the time" but admits to consuming thin as well, as he doesn't like the taste of thickener.   His last  Hospital admit for pna appears to be May 2012, pt had an MBS during that admit.  Lengthy discussion with pt reviewing previous test results and options at this time.   Pt and SLP concluded given chronicity of his dysphagia, isolated incident of choking on chicken, and lack of pulmonary infections x 1 1/2 years,  returning to previously prescribed diet with precautions would be appropriate.  Given pt's advanced age, his aspiration risk will be chronic.  Pt educated to option of water protocol to allow thin water between meals after oral care for hydration and QOL and maximal airway protection.  Further advised pt to increased risk of aspiration of solids versus liquids.  Pt agreeable to  following [employer] plan for maximal airway protection.  Pt acknowledges he has not been careful with eating but knows that it is necessary. Note ENT (who completed surgery on pt 11/6) recommended pt follow up as outpt with barium swallow study and possibly GI.  Pt does acknowledge h/o esophageal stretching many years ago, but unable to recall when/where completed.    Aspiration Risk  Moderate    Diet Recommendation Dysphagia 3 (Mechanical Soft);Nectar-thick liquid (thin water between meals )   Liquid Administration via: Straw;Cup Medication Administration: Whole meds with puree Supervision: Patient able to self feed Compensations: Small  sips/bites;Multiple dry swallows after each bite/sip;Slow rate Postural Changes and/or Swallow Maneuvers: Upright 30-60 min after meal;Seated upright 90 degrees;Chin tuck    Other  Recommendations Oral Care Recommendations: Oral care before and after PO Other Recommendations: Order  thickener from pharmacy   Follow Up Recommendations       Frequency and Duration min 1 x/week  1 week   Pertinent Vitals/Pain Afebrile, decr    SLP Swallow Goals Patient will utilize recommended strategies during swallow to increase swallowing safety with: Moderate assistance   Swallow Study Prior Functional Status  Type of Home: House Lives With: Spouse Available Help at Discharge: Personal care attendant;Available 24 hours/day Vocation: Retired    General Date of Onset: 02/15/12 HPI: 78 yo male adm to [facility] with shortness of breath, fever - tx'd for pna.  CXR 02/14/12 Atelectasis versus pneumonia at the left lung base with associated small left pleural effusion. Pt recently admitted after choking on chicken and required surgical removal of chicken from pyriform sinuses.  At home, pt presented with AMS, cough, and fever. admitted.  SLP swallow  eval ordered.  Pt with surgical hx of cervical fusion, C4-C5) and CVA without subsequent dysphagia per pt.  Type of Study: Bedside swallow evaluation Previous Swallow Assessment: MBS studies- multiple Diet Prior to this Study: Dysphagia 3 (soft);Thin liquids Temperature Spikes Noted: No Respiratory Status: Supplemental O2 delivered via (comment) (on oxygen at home) History of Recent Intubation: No Behavior/Cognition: Alert;Cooperative;Pleasant mood Oral Cavity - Dentition: Dentures, bottom;Dentures, top Self-Feeding Abilities: Able to feed self Patient Positioning: Upright in bed Baseline Vocal Quality: Clear Volitional Cough: Strong (productive) Volitional Swallow: Able to elicit    Oral/Motor/Sensory Function Overall Oral Motor/Sensory Function:  (slight facial decr right, otherwise unremarkable)   Ice Chips Ice chips: Not tested   Thin Liquid Thin Liquid: Impaired Presentation: Straw;Self Fed Pharyngeal  Phase Impairments: Cough - Delayed;Multiple swallows Other Comments: chin tuck    Nectar Thick Nectar Thick Liquid:  Impaired Presentation: Straw;Self Fed Pharyngeal Phase Impairments: Multiple swallows   Honey Thick Honey Thick Liquid: Not tested   Puree Puree: Impaired Presentation: Self Fed;Spoon Pharyngeal Phase Impairments: Multiple swallows Other Comments: pt not recalling multiple swallows   Solid   GO Functional Limitations: Swallowing  Solid: Impaired Presentation: Self Fed Pharyngeal Phase Impairments: Multiple swallows Other Comments: pt states eating is tiring       Donavan Burnet, MS [facility] SLP [phone]

## 2012-02-15 NOTE — Progress Notes (Addendum)
SLP Note Clinical bedside swallow evaluation completed, full report to follow.  Pt has a chronic dysphagia with known silent aspiration of thin liquids due to sensorimotor deficits.  Per pt, "choking" on chicken was an isolated incident and he normally eats/drinks well.  Pt states he consumes thickened drinks at home "most of the time" but admits to consuming thin as well, as he doesn't like the taste of thickener.   His last  Hospital admit for pna appears to be May 2012, pt had an MBS during that admit.  Lengthy discussion with pt reviewing previous test results and options at this time.   Pt and SLP concluded given chronicity of his dysphagia, isolated incident of choking on chicken, and lack of pulmonary infections x 1 1/2 years,  returning to previously prescribed diet with precautions would be appropriate.  Given pt's advanced age, his aspiration risk will be chronic.    Pt educated to option of water protocol to allow thin water between meals after oral care for hydration and QOL and maximal airway protection.  Further advised pt to increased risk of aspiration of solids versus liquids.  Pt agreeable to  following [employer] plan for maximal airway protection.  Pt acknowledges he has not been careful with eating but knows that it is necessary.   Note ENT (who completed surgery on pt 11/6) recommended pt follow up as outpt with barium swallow study and possibly GI.  Pt does acknowledge h/o esophageal stretching many years ago, but unable to recall when/where completed.  Recommendations:  Dys3/GROUND meats, nectar [place] Levels Water Protocol [employer] with sips Swallow 2-3 times with every bite/sip Medicine with applesauce, follow w/ nectar CNA present for all meals at home (pt's caregiver) Follow up with ba swallow and/or GI if MD indicates- monitor swallow closely!   Thanks for this referral.                      Corey Burnet, MS [facility] SLP                              [phone]

## 2012-02-15 NOTE — Evaluation (Signed)
Physical Therapy Evaluation Patient Details Name: [name] MRN: [id number] DOB: 1918-03-15 Today's Date: 02/15/2012 Time: 5284-1324 PT Time Calculation (min): 25 min  PT Assessment / Plan / Recommendation Clinical Impression  Very pleasant 78 y.o. male admitted with PNA. Pt ambulated 75' with RW and min/guard assist, SaO2 86% after walk. HHPT recommended, no DME needed. Pt would benefit from acute PT to facilitate return to prior level of function.     PT Assessment  Patient needs continued PT services    Follow Up Recommendations  Home health PT    Does the patient have the potential to tolerate intense rehabilitation      Barriers to Discharge None      Equipment Recommendations  None recommended by PT    Recommendations for Other Services OT consult   Frequency Min 3X/week    Precautions / Restrictions Precautions Precautions: Fall Restrictions Weight Bearing Restrictions: No   Pertinent Vitals/Pain **Pt denied pain SaO2 86% on 2L O2 after walking, up to 100% after 3 min rest, HR 77; RN aware*      Mobility  Bed Mobility Bed Mobility: Not assessed Transfers Transfers: Sit to Stand;Stand to Sit Sit to Stand: 5: Supervision;With armrests;From chair/3-in-1;With upper extremity assist Stand to Sit: 5: Supervision;To chair/3-in-1;With upper extremity assist Details for Transfer Assistance: VCs hand placement Ambulation/Gait Ambulation/Gait Assistance: 4: Min guard Ambulation Distance (Feet): 80 Feet Assistive device: Rolling walker Gait Pattern: Decreased step length - right;Decreased step length - left;Shuffle General Gait Details: min/guard for safety, distance limited by fatigue    Shoulder Instructions     Exercises     PT Diagnosis: Generalized weakness;Abnormality of gait  PT Problem List: Cardiopulmonary status limiting activity;Decreased activity tolerance;Decreased range of motion PT Treatment Interventions: Gait training;Stair  training;Functional mobility training;Therapeutic activities;Patient/family education;Therapeutic exercise   PT Goals Acute Rehab PT Goals PT Goal Formulation: With patient Time For Goal Achievement: 02/22/12 Potential to Achieve Goals: Good Pt will go Supine/Side to Sit: Independently;with HOB 0 degrees PT Goal: Supine/Side to Sit - Progress: Goal set today Pt will go Sit to Stand: with modified independence;with upper extremity assist PT Goal: Sit to Stand - Progress: Goal set today Pt will Ambulate: 51 - 150 feet;with modified independence;with rolling walker PT Goal: Ambulate - Progress: Goal set today Pt will Go Up / Down Stairs: 3-5 stairs;with supervision PT Goal: Up/Down Stairs - Progress: Goal set today Pt will Perform Home Exercise Program: Independently PT Goal: Perform Home Exercise Program - Progress: Goal set today  Visit Information  Last PT Received On: 02/15/12 Assistance Needed: +1    Subjective Data  Subjective: I'm worn out.  Patient Stated Goal: to get strength back   Prior Functioning  Home Living Lives With: Spouse Available Help at Discharge: Personal care attendant;Available 24 hours/day Type of Home: House Home Access: Stairs to enter [employer] of Steps: 3 Bathroom Shower/Tub: Naval architect Equipment: Environmental consultant - four wheeled;Shower chair without back;Grab bars in shower;Bedside commode/3-in-1 Additional Comments: wife is demented, they have 33* private aide, pt was independent with ADLs PTA and used 4 wheeled RW at all times, had home O2 also Prior Function Level of Independence: Independent with assistive device(s) Able to Take Stairs?: Yes Vocation: Retired Musician: No difficulties    Cognition  Overall Cognitive Status: Appears within functional limits for tasks assessed/performed Arousal/Alertness: Awake/alert Orientation Level: Appears intact for tasks assessed Behavior During Session: [facility] for  tasks performed    Extremity/Trunk Assessment Right Upper Extremity Assessment  RUE ROM/Strength/Tone: [facility] for tasks assessed Left Upper Extremity Assessment LUE ROM/Strength/Tone: WFL for tasks assessed Right Lower Extremity Assessment RLE ROM/Strength/Tone: Within functional levels (knee ext AROM -10) RLE Sensation: WFL - Light Touch RLE Coordination: WFL - gross/fine motor Left Lower Extremity Assessment LLE ROM/Strength/Tone: Within functional levels (knee ext AROM -10) LLE Sensation: WFL - Light Touch LLE Coordination: WFL - gross/fine motor Trunk Assessment Trunk Assessment: Kyphotic   Balance Balance Balance Assessed: Yes Static Sitting Balance Static Sitting - Balance Support: Feet supported;No upper extremity supported Static Sitting - Level of Assistance: 7: Independent Static Sitting - Comment/# of Minutes: 2  End of Session PT - End of Session Equipment Utilized During Treatment: Gait belt;Oxygen Activity Tolerance: Patient limited by fatigue Patient left: in chair;with call bell/phone within reach Nurse Communication: Mobility status  GP     Ralene Bathe Kistler 02/15/2012, 8:44 AM  [phone]

## 2012-02-15 NOTE — Evaluation (Signed)
Occupational Therapy Evaluation Patient Details Name: [name] MRN: [id number] DOB: October 06, 1917 Today's Date: 02/15/2012 Time: 4098-1191 OT Time Calculation (min): 25 min  OT Assessment / Plan / Recommendation Clinical Impression  Very pleasant 78 y.o. male admitted with PNA. Pt has 24/ 7 caregivers at home with wife who has dementia. Skilled OT indicated to maximize independence with BADLs to supervision level in prep for d/c home with HHOT.    OT Assessment  Patient needs continued OT Services    Follow Up Recommendations  Home health OT    Barriers to Discharge      Equipment Recommendations  None recommended by OT    Recommendations for Other Services    Frequency  Min 2X/week    Precautions / Restrictions Precautions Precautions: Fall Restrictions Weight Bearing Restrictions: No   Pertinent Vitals/Pain Denied pain    ADL  Eating/Feeding: Performed;Set up Where Assessed - Eating/Feeding: Chair Grooming: Performed;Wash/dry hands;Min guard Where Assessed - Grooming: Unsupported sitting Upper Body Bathing: Simulated;Set up Where Assessed - Upper Body Bathing: Unsupported sitting Lower Body Bathing: Simulated;Moderate assistance Where Assessed - Lower Body Bathing: Supported sit to stand Upper Body Dressing: Simulated;Set up Where Assessed - Upper Body Dressing: Unsupported sitting Lower Body Dressing: Simulated;Maximal assistance Where Assessed - Lower Body Dressing: Supported sit to stand Toilet Transfer: Performed;Min guard Statistician Method: Sit to Barista: Raised toilet seat with arms (or 3-in-1 over toilet) Toileting - Clothing Manipulation and Hygiene: Performed;Minimal assistance Where Assessed - Engineer, mining and Hygiene: Standing Equipment Used: Rolling walker;Gait belt Transfers/Ambulation Related to ADLs: Pt ambulated to the bathroom with short, shuffling steps. Was not able to reach feet or  cross ankle over opposite knee to don/doff socks and shoes. ADL Comments: Pt fatigues very quickly. O2 sats at 84% on 2L of O2 following light activity.    OT Diagnosis: Generalized weakness  OT Problem List: Decreased activity tolerance;Decreased strength;Decreased range of motion OT Treatment Interventions: Self-care/ADL training;Therapeutic activities;DME and/or AE instruction;Patient/family education   OT Goals Acute Rehab OT Goals OT Goal Formulation: With patient Time For Goal Achievement: 02/29/12 Potential to Achieve Goals: Good ADL Goals Pt Will Perform Grooming: with supervision;Standing at sink ADL Goal: Grooming - Progress: Goal set today Pt Will Perform Lower Body Bathing: with supervision;Sit to stand from chair;Sit to stand from bed ADL Goal: Lower Body Bathing - Progress: Goal set today Pt Will Perform Lower Body Dressing: with supervision;Sit to stand from chair;Sit to stand from bed ADL Goal: Lower Body Dressing - Progress: Goal set today Pt Will Transfer to Toilet: with supervision;Ambulation;3-in-1;Raised toilet seat with arms ADL Goal: Toilet Transfer - Progress: Goal set today Pt Will Perform Toileting - Clothing Manipulation: with supervision;Sitting on 3-in-1 or toilet;Standing ADL Goal: Toileting - Clothing Manipulation - Progress: Goal set today Pt Will Perform Tub/Shower Transfer: with min assist;Grab bars;Ambulation ADL Goal: Tub/Shower Transfer - Progress: Goal set today  Visit Information  Last OT Received On: 02/15/12 Assistance Needed: +1 PT/OT Co-Evaluation/Treatment: Yes    Subjective Data  Subjective: Wow. I;m tired out. Patient Stated Goal: Return home tomorrow.   Prior Functioning     Home Living Lives With: Spouse Available Help at Discharge: Personal care attendant;Available 24 hours/day Type of Home: House Home Access: Stairs to enter [employer] of Steps: 3 Bathroom Shower/Tub: Naval architect Equipment:  Environmental consultant - four wheeled;Shower chair without back;Grab bars in shower;Bedside commode/3-in-1 Additional Comments: wife is demented, they have 15* private aide, pt was independent with  ADLs PTA and used 4 wheeled RW at all times, had home O2 also Prior Function Level of Independence: Independent with assistive device(s) Able to Take Stairs?: Yes Vocation: Retired Musician: No difficulties         Vision/Perception     Cognition  Overall Cognitive Status: Appears within functional limits for tasks assessed/performed Arousal/Alertness: Awake/alert Orientation Level: Appears intact for tasks assessed Behavior During Session: [facility] for tasks performed    Extremity/Trunk Assessment Right Upper Extremity Assessment RUE ROM/Strength/Tone: [facility] for tasks assessed Left Upper Extremity Assessment LUE ROM/Strength/Tone: Deficits LUE ROM/Strength/Tone Deficits: 2* old elbow fx. Right Lower Extremity Assessment RLE ROM/Strength/Tone: Within functional levels (knee ext AROM -10) RLE Sensation: WFL - Light Touch RLE Coordination: WFL - gross/fine motor Left Lower Extremity Assessment LLE ROM/Strength/Tone: Within functional levels (knee ext AROM -10) LLE Sensation: WFL - Light Touch LLE Coordination: WFL - gross/fine motor Trunk Assessment Trunk Assessment: Kyphotic     Mobility Bed Mobility Bed Mobility: Not assessed Transfers Sit to Stand: 5: Supervision;4: Min guard;With upper extremity assist;From chair/3-in-1 Stand to Sit: 5: Supervision;To chair/3-in-1;With upper extremity assist Details for Transfer Assistance: VCs hand placement     Shoulder Instructions     Exercise     Balance Balance Balance Assessed: Yes Static Sitting Balance Static Sitting - Balance Support: Feet supported;No upper extremity supported Static Sitting - Level of Assistance: 7: Independent Static Sitting - Comment/# of Minutes: 2   End of Session OT - End of Session Equipment  Utilized During Treatment: Gait belt Activity Tolerance: Patient limited by fatigue Patient left: in chair;with call bell/phone within reach  GO     Mekaela Azizi A OTR/L [phone] 02/15/2012, 9:37 AM

## 2012-02-15 NOTE — Progress Notes (Signed)
[facility]             Progress Note   Subjective: No complaints. Feels well. Wife and caregiver at home. Updated on plan of care.  Objective: Vital signs in last 24 hours: Temp:  [97.2 F (36.2 C)-97.6 F (36.4 C)] 97.2 F (36.2 C) (11/11 1344) Pulse Rate:  [70-95] 70  (11/11 1344) Resp:  [17-20] 17  (11/11 1344) BP: (104-137)/(59-73) 111/59 mmHg (11/11 1344) SpO2:  [86 %-99 %] 98 % (11/11 1344) Weight:  [53.887 kg (118 lb 12.8 oz)] 53.887 kg (118 lb 12.8 oz) (11/10 1605) Weight change:  Last BM Date:  (PTA)  Intake/Output from previous day: 11/10 0701 - 11/11 0700 In: 1087 [P.O.:420; I.V.:667] Out: 150 [Urine:150] Total I/O In: 480 [P.O.:480] Out: -    Physical Exam: General: Alert, awake, oriented x3, in no acute distress. HEENT: No bruits, no goiter. Heart: Regular rate and rhythm, without murmurs, rubs, gallops. Lungs: Clear to auscultation bilaterally. Abdomen: Soft, nontender, nondistended, positive bowel sounds. Extremities: No clubbing cyanosis or edema with positive pedal pulses. Neuro: Grossly intact, nonfocal.    Lab Results: Basic Metabolic Panel:  Basename 02/15/12 0420 02/14/12 1227  NA 128* 129*  K 4.2 4.1  CL 94* 92*  CO2 25 30  GLUCOSE 187* 132*  BUN 18 15  CREATININE 0.54 0.60  CALCIUM 8.9 8.9  MG -- --  PHOS -- --   CBC:  Basename 02/15/12 0420 02/14/12 1227  WBC 7.0 5.2  NEUTROABS -- 3.8  HGB 11.4* 12.4*  HCT 33.6* 36.4*  MCV 84.2 84.8  PLT 171 146*    Studies/Results: Dg Chest 2 View  02/14/2012  *RADIOLOGY REPORT*  Clinical Data: Chest pain and shortness of breath.  CHEST - 2 VIEW  Comparison: 02/10/2012  Findings: Stable COPD.  Some increased density at the left lung base noted obscuring part of the left hemidiaphragm.  This may represent atelectasis or pneumonia.  No pulmonary edema is identified.  There likely is a small amount of left pleural fluid.  IMPRESSION: Atelectasis versus pneumonia at the left  lung base with associated small left pleural effusion.   Original Report Authenticated By: Irish Lack, M.D.     Medications: Scheduled Meds:   . [COMPLETED] sodium chloride   Intravenous STAT  . [COMPLETED] ampicillin-sulbactam (UNASYN) IV  3 g Intravenous Once  . ampicillin-sulbactam (UNASYN) IV  3 g Intravenous Q8H  . antiseptic oral rinse  15 mL Mouth Rinse BID  . budesonide-formoterol  2 puff Inhalation BID  . carvedilol  3.125 mg Oral BID WC  . clopidogrel  75 mg Oral Daily  . digoxin  125 mcg Oral Daily  . dutasteride  0.5 mg Oral Daily  . enoxaparin (LOVENOX) injection  40 mg Subcutaneous Q24H  . fluticasone  2 spray Each Nare Daily  . furosemide  20 mg Oral Daily  . ipratropium  260 mcg Nebulization BID  . levothyroxine  50 mcg Oral QAC breakfast  . pantoprazole  40 mg Oral Daily  . spironolactone  25 mg Oral BID  . Tamsulosin HCl  0.4 mg Oral Daily   Continuous Infusions:   . [EXPIRED] sodium chloride    . [DISCONTINUED] sodium chloride 125 mL/hr at 02/14/12 1204   PRN Meds:.acetaminophen, acetaminophen, albuterol, LORazepam, ondansetron (ZOFRAN) IV, ondansetron, oxyCODONE, senna-docusate  Assessment/Plan:  Principal Problem:  *Aspiration pneumonia Active Problems:  Weakness generalized  HYPOTHYROIDISM  HYPERTENSION  STROKE  Chronic respiratory failure with hypoxia  COPD (chronic obstructive pulmonary disease)  Hyponatremia   Aspiration Pneumonia  -Clinically doing well. -No fever, leukocytosis or SOB. -Transition to PO clindamycin.   Chronic Respiratory Failure  -On account of his COPD.  -Continue Oxygen at 2 L.  -Is not actively wheezing at present, do not believe we need to treat this is an acute exacerbation with steroids.  -Continue symbicort and PRN albuterol nebs.   Hyponatremia  -Appears to be chronic.  -No further workup will be initiated at this time.   Hypothyroidism  -Continue synthroid.   H/o CVA  -Continue plavix.   DVT  Prophylaxis  -Lovenox.  Disposition -Plan DC home in am.    Time spent coordinating care: 35 minutes.   LOS: 1 day   [facility] Pager: [phone] 02/15/2012, 3:30 PM

## 2012-02-15 NOTE — Care Management Note (Signed)
    Page 1 of 1   02/15/2012     12:50:44 PM   CARE MANAGEMENT NOTE 02/15/2012  Patient:  [name], [name]   Account Number:  [id number]  Date Initiated:  02/15/2012  Documentation initiated by:  Konrad Felix  Subjective/Objective Assessment:   Admitted with new onset of SOB.     Action/Plan:   Discharge to home when medically stable.   Anticipated DC Date:  02/16/2012   Anticipated DC Plan:  HOME W HOME HEALTH SERVICES      DC Planning Services  CM consult      Choice offered to / List presented to:             Status of service:  Completed, signed off Medicare Important Message given?   (If response is "NO", the following Medicare IM given date fields will be blank) Date Medicare IM given:   Date Additional Medicare IM given:    Discharge Disposition:    Per UR Regulation:  Reviewed for med. necessity/level of care/duration of stay  If discussed at Long Length of Stay Meetings, dates discussed:    Comments:  02/15/2012  12:00pm  Konrad Felix RN, case mgr.   [phone] Met with patient to discuss any home health needs anticipated toward discharge. Patient states he and is wife (dementia) are at home with a 24/7 caregiver. He states he does not need any equipment or services, that they have everything already handled.

## 2012-02-15 NOTE — Progress Notes (Signed)
Initial review for inpatient status is complete. 

## 2012-02-16 DIAGNOSIS — R1310 Dysphagia, unspecified: Secondary | ICD-10-CM

## 2012-02-16 LAB — BASIC METABOLIC PANEL
CO2: 33 mEq/L — ABNORMAL HIGH (ref 19–32)
Glucose, Bld: 93 mg/dL (ref 70–99)
Potassium: 4.4 mEq/L (ref 3.5–5.1)
Sodium: 130 mEq/L — ABNORMAL LOW (ref 135–145)

## 2012-02-16 LAB — CBC
Hemoglobin: 10.9 g/dL — ABNORMAL LOW (ref 13.0–17.0)
RBC: 3.88 MIL/uL — ABNORMAL LOW (ref 4.22–5.81)

## 2012-02-16 MED ORDER — CLINDAMYCIN HCL 300 MG PO CAPS: 300.0000 mg | ORAL_CAPSULE | Freq: Four times a day (QID) | ORAL | Status: DC

## 2012-02-16 NOTE — Progress Notes (Signed)
Occupational Therapy Treatment Patient Details Name: [name] MRN: [id number] DOB: [birth year] Today's Date: 02/16/2012 Time: 4098-1191 OT Time Calculation (min): 23 min  OT Assessment / Plan / Recommendation Comments on Treatment Session Pt continues to fatigue quickly which is his baseline. Will have necessary level of A from caregiver upon d/c.    Follow Up Recommendations  Home health OT    Barriers to Discharge       Equipment Recommendations  None recommended by OT    Recommendations for Other Services    Frequency Min 2X/week   Plan Discharge plan remains appropriate    Precautions / Restrictions Precautions Precautions: Fall Restrictions Weight Bearing Restrictions: No   Pertinent Vitals/Pain 94% on 2L following ADL activity. No c/o pain    ADL  Grooming: Performed;Wash/dry face;Set up Where Assessed - Grooming: Unsupported sitting Upper Body Bathing: Performed;Set up Where Assessed - Upper Body Bathing: Unsupported sitting Lower Body Bathing: Performed;Minimal assistance (for lower legs and feet) Where Assessed - Lower Body Bathing: Supported sit to stand Upper Body Dressing: Performed;Set up Where Assessed - Upper Body Dressing: Unsupported sitting Lower Body Dressing: Performed;Moderate assistance (to don socks and thread LLE into pant leg.) Where Assessed - Lower Body Dressing: Supported sit to Pharmacist, hospital: Buyer, retail Method: Sit to Barista: Other (comment) (recliner) Equipment Used: Rolling walker ADL Comments: Pt fatigues very quickly. Educated pt to take frequent RBs during morning routine.    OT Diagnosis:    OT Problem List:   OT Treatment Interventions:     OT Goals ADL Goals ADL Goal: Grooming - Progress: Progressing toward goals ADL Goal: Lower Body Bathing - Progress: Progressing toward goals ADL Goal: Lower Body Dressing - Progress: Progressing toward  goals ADL Goal: Toilet Transfer - Progress: Progressing toward goals ADL Goal: Toileting - Clothing Manipulation - Progress: Progressing toward goals  Visit Information  Last OT Received On: 02/16/12 Assistance Needed: +1    Subjective Data  Subjective: The girl who takes care of [place] is very good.   Prior Functioning       Cognition  Overall Cognitive Status: Appears within functional limits for tasks assessed/performed Arousal/Alertness: Awake/alert Orientation Level: Appears intact for tasks assessed Behavior During Session: [facility] for tasks performed    Mobility  Shoulder Instructions Transfers Sit to Stand: 5: Supervision;With upper extremity assist;From chair/3-in-1;With armrests Stand to Sit: 5: Supervision;With upper extremity assist;To chair/3-in-1;With armrests Details for Transfer Assistance: min VCs for hand placement.       Exercises      Balance Static Standing Balance Static Standing - Balance Support: No upper extremity supported;During functional activity Static Standing - Level of Assistance: 5: Stand by assistance;4: Min assist Static Standing - Comment/# of Minutes: varied when bathing peri area.   End of Session OT - End of Session Activity Tolerance: Patient limited by fatigue Patient left: in chair;with call bell/phone within reach  GO     Zyla Dascenzo A OTR/L [phone] 02/16/2012, 9:20 AM

## 2012-02-16 NOTE — Progress Notes (Signed)
Speech Language Pathology Dysphagia Treatment Patient Details Name: [name] MRN: [id number] DOB: 05-06-1917 Today's Date: 02/16/2012 Time: 7829-5621 SLP Time Calculation (min): 10 min  Assessment / Plan / Recommendation Clinical Impression  Follow up with pt to assure all education completed and pt comprehends purpose of compensatory strategies.  Note pt intake documented as 75-100%, diet modified to Dys2 by RD due to pt reporting pot roast being diffiucult to manage.  Pt reports he ate approx 50% of breakfast - consuming all of his oatmeal due to it being easiest to consume.    Pt verbalized his compensatory strategies to SLP including tucking his chin with drinks and swallowing twice with all bites/sips without SlP cue!  SLP suspects pt will resume consuming thin drinks at home after d'c but apparently he had been tolerating well and only got asp pna from probable asp of food.     Intake has been good here in the hospital, appearing consistent with prior to admit.  Pt reports he eats and eats at home but can't gain weight.    SLP to sign off at this time as all education is completed to help this pt compensate for his chronic dysphagia.  Advised pt to monitor swallowing closely and note that ENT had recommended follow up with a barium swallow and possibly GI as outpt.    Please reorder if desire. Thanks.     Diet Recommendation  Initiate / Change Diet: Dysphagia 2 (fine chop);Nectar-thick liquid (frazier water protocol) - diet had been modified yesterday by RD   SLP Plan All goals met   Pertinent Vitals/Pain Afebrile, decreased, 2 liters oxygen   Swallowing Goals  SLP Swallowing Goals Swallow Study Goal #2 - Progress: Met   Dysphagia Treatment Treatment focused on: Skilled observation of diet tolerance;Patient/family/caregiver education;Utilization of compensatory strategies Family/Caregiver Educated: pt Treatment Methods/Modalities: Skilled observation Patient  observed directly with PO's: Yes Type of PO's observed: Thin liquids;Nectar-thick liquids Feeding: Able to feed self Liquids provided via: Cup Pharyngeal Phase Signs & Symptoms: Immediate cough (after thin water swallow, suspect audible aspiration)   GO     Donavan Burnet, MS [facility] SLP [phone]

## 2012-02-16 NOTE — Progress Notes (Signed)
Patient is worried about going home tomorrow. Feels he is "not ready", "May need another day". SOB with exertion and "weak". Told patient to discuss with MD in am.

## 2012-02-16 NOTE — Progress Notes (Signed)
Physical Therapy Treatment Patient Details Name: [name] MRN: [id number] DOB: May 02, 1917 Today's Date: 02/16/2012 Time: 7829-5621 PT Time Calculation (min): 13 min  PT Assessment / Plan / Recommendation Comments on Treatment Session  Pt improving steadily in exercise tolerance and safety.      Follow Up Recommendations  Home health PT     Does the patient have the potential to tolerate intense rehabilitation     Barriers to Discharge        Equipment Recommendations  None recommended by PT    Recommendations for Other Services    Frequency Min 3X/week   Plan Discharge plan remains appropriate;Frequency remains appropriate    Precautions / Restrictions Precautions Precautions: Fall Restrictions Weight Bearing Restrictions: No   Pertinent Vitals/Pain No c/o pain today    Mobility  Transfers Sit to Stand: 5: Supervision;With upper extremity assist;From chair/3-in-1;With armrests Stand to Sit: 5: Supervision;With upper extremity assist;To chair/3-in-1;With armrests Details for Transfer Assistance: min VCs for hand placement. Ambulation/Gait Ambulation/Gait Assistance: 5: Supervision Ambulation Distance (Feet): 100 Feet Assistive device: Rolling walker Ambulation/Gait Assistance Details: Pt with kyphosis and generalized atrophy, but does not need hands on assist for balance Gait Pattern: Decreased step length - right;Decreased step length - left;Shuffle;Trunk flexed Gait velocity: decreased General Gait Details: Pt maintains small, shuffling steps and needs supplemental O2.  He has decreased endurance and knows when it is time to turn around.  Stairs: No Wheelchair Mobility Wheelchair Mobility: No    Exercises     PT Diagnosis:    PT Problem List:   PT Treatment Interventions:     PT Goals Acute Rehab PT Goals PT Goal Formulation: With patient Time For Goal Achievement: 02/22/12 Potential to Achieve Goals: Good Pt will go Supine/Side to Sit:  Independently;with HOB 0 degrees Pt will go Sit to Stand: with modified independence;with upper extremity assist PT Goal: Sit to Stand - Progress: Progressing toward goal Pt will Ambulate: 51 - 150 feet;with modified independence;with rolling walker PT Goal: Ambulate - Progress: Progressing toward goal Pt will Go Up / Down Stairs: 3-5 stairs;with supervision Pt will Perform Home Exercise Program: Independently  Visit Information  Last PT Received On: 02/16/12 Assistance Needed: +1    Subjective Data  Subjective: "Thank you dear" Patient Stated Goal: to go home today   Cognition  Overall Cognitive Status: Appears within functional limits for tasks assessed/performed Arousal/Alertness: Awake/alert Orientation Level: Appears intact for tasks assessed Behavior During Session: [facility] for tasks performed    Balance  Static Sitting Balance Static Sitting - Balance Support: No upper extremity supported;Feet unsupported Static Sitting - Level of Assistance: 7: Independent Static Standing Balance Static Standing - Balance Support: No upper extremity supported;During functional activity Static Standing - Level of Assistance: 5: Stand by assistance;4: Min assist Static Standing - Comment/# of Minutes: Pt able to walk to the bathroom and urinate standing, turn and wash hands, then continue walking, all with supervision only  End of Session PT - End of Session Equipment Utilized During Treatment: Oxygen Activity Tolerance: Patient limited by fatigue Patient left: in chair;with call bell/phone within reach;with nursing in room Nurse Communication: Mobility status   GP     Rosey Bath K. Manson Passey, PT [phone] 02/16/2012, 10:19 AM

## 2012-02-16 NOTE — Plan of Care (Signed)
Problem: Phase I Progression Outcomes Goal: OOB as tolerated unless otherwise ordered Outcome: Completed/Met Date Met:  02/16/12 Up with use of walker to bathroom and oob in room.

## 2012-02-16 NOTE — Progress Notes (Signed)
Reviewed AVS with patient. He verbalized understanding. 

## 2012-02-16 NOTE — Discharge Summary (Signed)
Physician Discharge Summary  Patient ID: MATTEUS MCNELLY MRN: [id number] DOB/AGE: 1917-10-17 78 y.o.  Admit date: 02/14/2012 Discharge date: 02/16/2012  Primary Care Physician:  Rogelia Boga, MD   Discharge Diagnoses:    Principal Problem:  *Aspiration pneumonia Active Problems:  Weakness generalized  HYPOTHYROIDISM  HYPERTENSION  STROKE  Chronic respiratory failure with hypoxia  COPD (chronic obstructive pulmonary disease)  Hyponatremia  Dysphagia      Medication List     As of 02/16/2012 11:12 AM    STOP taking these medications         levofloxacin 500 MG tablet   Commonly known as: LEVAQUIN      TAKE these medications         budesonide-formoterol 160-4.5 MCG/ACT inhaler   Commonly known as: SYMBICORT   Inhale 2 puffs into the lungs 2 (two) times daily.      carvedilol 6.25 MG tablet   Commonly known as: COREG   Take 3.125 mg by mouth 2 (two) times daily with a meal. One half tablet twice daily      clindamycin 300 MG capsule   Commonly known as: CLEOCIN   Take 1 capsule (300 mg total) by mouth every 6 (six) hours.      clopidogrel 75 MG tablet   Commonly known as: PLAVIX   Take 75 mg by mouth daily.      digoxin 0.125 MG tablet   Commonly known as: LANOXIN   Take 125 mcg by mouth daily.      dutasteride 0.5 MG capsule   Commonly known as: AVODART   Take 1 capsule (0.5 mg total) by mouth daily.      fluticasone 50 MCG/ACT nasal spray   Commonly known as: FLONASE   Place 2 sprays into the nose daily.      furosemide 20 MG tablet   Commonly known as: LASIX   Take 1 tablet (20 mg total) by mouth daily.      ipratropium 0.02 % nebulizer solution   Commonly known as: ATROVENT   Take 250 mcg by nebulization 2 (two) times daily.      levothyroxine 50 MCG tablet   Commonly known as: SYNTHROID, LEVOTHROID   Take 50 mcg by mouth daily.      LORazepam 0.5 MG tablet   Commonly known as: ATIVAN   Take 0.5 mg by mouth every 8  (eight) hours as needed. Anxiety      pantoprazole 40 MG tablet   Commonly known as: PROTONIX   Take 40 mg by mouth daily.      potassium chloride 10 MEQ tablet   Commonly known as: K-DUR,KLOR-CON   Take 10 mEq by mouth daily.      spironolactone 25 MG tablet   Commonly known as: ALDACTONE   Take 25 mg by mouth 2 (two) times daily.      Tamsulosin HCl 0.4 MG Caps   Commonly known as: FLOMAX   Take 0.4 mg by mouth daily.      traMADol 50 MG tablet   Commonly known as: ULTRAM   Take 50 mg by mouth every 6 (six) hours as needed. For pain         Disposition and Follow-up:  Will be discharged home today in stable and improved condition. Has been advised to followup with his PCP in 2 weeks.  Consults:  None   Significant Diagnostic Studies:  Dg Chest 2 View  02/14/2012  *RADIOLOGY REPORT*  Clinical Data: Chest pain and  shortness of breath.  CHEST - 2 VIEW  Comparison: 02/10/2012  Findings: Stable COPD.  Some increased density at the left lung base noted obscuring part of the left hemidiaphragm.  This may represent atelectasis or pneumonia.  No pulmonary edema is identified.  There likely is a small amount of left pleural fluid.  IMPRESSION: Atelectasis versus pneumonia at the left lung base with associated small left pleural effusion.   Original Report Authenticated By: Irish Lack, M.D.     Brief H and P: For complete details please refer to admission H and P, but in brief patient is a 78 y/o man who lives at home with a demented wife and a caregiver, comes to the hospital today via EMS 2/2 SOB. His PMH is significant for COPD on home oxygen at 2 L, HTN, prior CVA, CAD, hypothyroidism, among other things. He was recently in the hospital after choking on a piece of chicken that required endoscopic removal by ENT. He states he called the ambulance today because he developed sudden SOB. Was apparently wheezing upon arrival to the ED. Denies cough, fever, chills, CP or any other  symptoms. He is found to have a PNA on CXR and we weresked to admit him for further evaluation and management.     Hospital Course:  Principal Problem:  *Aspiration pneumonia Active Problems:  Weakness generalized  HYPOTHYROIDISM  HYPERTENSION  STROKE  Chronic respiratory failure with hypoxia  COPD (chronic obstructive pulmonary disease)  Hyponatremia  Dysphagia   Aspiration Pneumonia -Has received 3 days of Unasyn. -Will transition over to PO clindamycin for DC. -He will complete 10 more days at DC.  Dysphagia -Has been seen by ST. -They recommend Dysp III diet with nectar-thick liquids.  Rest of chronic medical issues have been stable this hospitalization and his home medications have not been altered.    Time spent on Discharge: Greater than 30 minutes.  SignedChaya Jan [facility] Pager: [phone] 02/16/2012, 11:12 AM

## 2012-02-18 ENCOUNTER — Encounter: Payer: Self-pay | Admitting: Internal Medicine

## 2012-02-18 ENCOUNTER — Ambulatory Visit (INDEPENDENT_AMBULATORY_CARE_PROVIDER_SITE_OTHER): Payer: Medicare Other | Admitting: Internal Medicine

## 2012-02-18 VITALS — BP 112/70 | HR 65 | Temp 97.6°F | Resp 20 | Wt 116.0 lb

## 2012-02-18 DIAGNOSIS — I2510 Atherosclerotic heart disease of native coronary artery without angina pectoris: Secondary | ICD-10-CM

## 2012-02-18 DIAGNOSIS — I4891 Unspecified atrial fibrillation: Secondary | ICD-10-CM

## 2012-02-18 DIAGNOSIS — J449 Chronic obstructive pulmonary disease, unspecified: Secondary | ICD-10-CM

## 2012-02-18 DIAGNOSIS — I5022 Chronic systolic (congestive) heart failure: Secondary | ICD-10-CM

## 2012-02-18 NOTE — Progress Notes (Signed)
Subjective:    Patient ID: [name], male    DOB: Aug 05, 1917, 78 y.o.   MRN: [id number]  HPI  78 year old patient who is seen today post hospital discharge.  The patient to was treated as an outpatient for a food impaction assessment developed aspiration pneumonia. He was initially placed on a quinolone but was admitted to the hospital 2 days later do to weakness and worsening shortness of breath. Chest x-ray confirmed a left lower lobe infiltrate. He now is on clindamycin and feels that his status is back to baseline. During the hospital. He was seen in consultation by speech therapy.  Hospital records reviewed  Past Medical History  Diagnosis Date  . Abnormality of gait 09/28/2008  . Acquired deformity of chest and rib 01/14/2009  . Atrial fibrillation 09/30/2006  . BENIGN PROSTATIC HYPERTROPHY, WITH URINARY OBSTRUCTION 09/04/2009  . CHRONIC OBSTRUCTIVE PULMONARY DISEASE, ACUTE EXACERBATION 03/24/2007  . Chronic systolic heart failure 08/06/2008  . COPD 09/30/2006  . CORONARY ARTERY DISEASE 10/01/2006  . GERD 04/19/2007  . HYPERTENSION 09/30/2006  . HYPOTHYROIDISM 09/30/2006  . LASSITUDE 06/13/2007  . Osteoarth NOS-Unspec 09/30/2006  . PEPTIC ULCER DISEASE 09/30/2006  . PERIPHERAL VASCULAR DISEASE 09/30/2006  . SPINAL STENOSIS 08/21/2009  . STROKE 08/28/2008    History   Social History  . Marital Status: Married    Spouse Name: N/A    Number of Children: N/A  . Years of Education: N/A   Occupational History  . 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 Smoking status: Former Smoker -- 0.8 packs/day for 20 years    Types: Cigarettes    Quit date: 04/06/1948  . Smokeless tobacco: Never Used  . Alcohol Use: No  . Drug Use: No  . Sexually Active: Not on file   Other Topics Concern  . Not on file   Social History Narrative   Lives at home with wife and has caretaker, still ambulatory daily with a walker. Had 2 children, 1 passed  away.      Past Surgical History  Procedure Date  . Appendectomy   . Hernia repair   . Tonsillectomy   . Abdominal aortic aneurysm repair   . Transurethral resection of prostate   . Lumbar laminectomy   . Laryngoscopy and esophagoscopy 02/10/2012    Procedure: LARYNGOSCOPY AND ESOPHAGOSCOPY;  Surgeon: Flo Shanks, MD;  Location: [facility];  Service: ENT;  Laterality: N/A;  DIRECT LARYNGOSCOPY/REMOVAL FOREIGN BODY FROM ESOPHAGUS    No family history on file.  Allergies  Allergen Reactions  . Bleph-10 (Sulfacetamide Sodium)   . Procaine Hcl     REACTION: syncope    Current Outpatient Prescriptions on File Prior to Visit  Medication Sig Dispense Refill  . budesonide-formoterol (SYMBICORT) 160-4.5 MCG/ACT inhaler Inhale 2 puffs into the lungs 2 (two) times daily.  1 Inhaler  12  . carvedilol (COREG) 6.25 MG tablet Take 3.125 mg by mouth 2 (two) times daily with a meal. One half tablet twice daily      . clindamycin (CLEOCIN) 300 MG capsule Take 1 capsule (300 mg total) by mouth every 6 (six) hours.  40 capsule  0  . clopidogrel (PLAVIX) 75 MG tablet Take 75 mg by mouth daily.      . digoxin (LANOXIN) 0.125 MG tablet Take 125 mcg by mouth daily.      Marland Kitchen dutasteride (AVODART) 0.5 MG capsule Take 1 capsule (0.5 mg total) by mouth  daily.  50 capsule  3  . fluticasone (FLONASE) 50 MCG/ACT nasal spray Place 2 sprays into the nose daily.  1 g  0  . ipratropium (ATROVENT) 0.02 % nebulizer solution Take 250 mcg by nebulization 2 (two) times daily.      Marland Kitchen levothyroxine (SYNTHROID, LEVOTHROID) 50 MCG tablet Take 50 mcg by mouth daily.      Marland Kitchen LORazepam (ATIVAN) 0.5 MG tablet Take 0.5 mg by mouth every 8 (eight) hours as needed. Anxiety      . pantoprazole (PROTONIX) 40 MG tablet Take 40 mg by mouth daily.      . potassium chloride (K-DUR,KLOR-CON) 10 MEQ tablet Take 10 mEq by mouth daily.      Marland Kitchen spironolactone (ALDACTONE) 25 MG tablet Take 25 mg by mouth 2 (two) times daily.      . Tamsulosin HCl  (FLOMAX) 0.4 MG CAPS Take 0.4 mg by mouth daily.      . traMADol (ULTRAM) 50 MG tablet Take 50 mg by mouth every 6 (six) hours as needed. For pain      . furosemide (LASIX) 20 MG tablet Take 1 tablet (20 mg total) by mouth daily.  90 tablet  3    BP 112/70  Pulse 65  Temp 97.6 F (36.4 C) (Oral)  Resp 20  Wt 116 lb (52.617 kg)  SpO2 80%       Review of Systems  Constitutional: Positive for fatigue. Negative for fever, chills and appetite change.  HENT: Positive for trouble swallowing. Negative for hearing loss, ear pain, congestion, sore throat, neck stiffness, dental problem, voice change and tinnitus.   Eyes: Negative for pain, discharge and visual disturbance.  Respiratory: Positive for cough. Negative for chest tightness, wheezing and stridor.   Cardiovascular: Negative for chest pain, palpitations and leg swelling.  Gastrointestinal: Negative for nausea, vomiting, abdominal pain, diarrhea, constipation, blood in stool and abdominal distention.  Genitourinary: Negative for urgency, hematuria, flank pain, discharge, difficulty urinating and genital sores.  Musculoskeletal: Negative for myalgias, back pain, joint swelling, arthralgias and gait problem.  Skin: Negative for rash.  Neurological: Positive for weakness. Negative for dizziness, syncope, speech difficulty, numbness and headaches.  Hematological: Negative for adenopathy. Does not bruise/bleed easily.  Psychiatric/Behavioral: Negative for behavioral problems and dysphoric mood. The patient is not nervous/anxious.        Objective:   Physical Exam  Constitutional: He is oriented to person, place, and time. He appears well-developed.  HENT:  Head: Normocephalic.  Right Ear: External ear normal.  Left Ear: External ear normal.  Eyes: Conjunctivae normal and EOM are normal.  Neck: Normal range of motion.  Cardiovascular: Normal rate and normal heart sounds.   Pulmonary/Chest: Effort normal and breath sounds normal.  No respiratory distress. He has no wheezes. He has no rales.       Nasal O2 in place Oxygen saturation 90 percent  Abdominal: Bowel sounds are normal.  Musculoskeletal: Normal range of motion. He exhibits no edema and no tenderness.  Neurological: He is alert and oriented to person, place, and time.  Psychiatric: He has a normal mood and affect. His behavior is normal.          Assessment & Plan:   Improving aspiration pneumonia. We'll complete antibiotic therapy COPD Chronic systolic heart failure  Recheck in 3 months or as needed

## 2012-02-18 NOTE — Patient Instructions (Signed)
Call or return to clinic prn if these symptoms worsen or fail to improve as anticipated.

## 2012-02-19 ENCOUNTER — Other Ambulatory Visit: Payer: Self-pay | Admitting: Internal Medicine

## 2012-02-19 NOTE — Telephone Encounter (Signed)
Med filled.  

## 2012-02-21 LAB — CULTURE, BLOOD (ROUTINE X 2): Culture: NO GROWTH

## 2012-03-07 ENCOUNTER — Other Ambulatory Visit: Payer: Self-pay | Admitting: Internal Medicine

## 2012-03-10 ENCOUNTER — Other Ambulatory Visit: Payer: Self-pay | Admitting: *Deleted

## 2012-03-10 MED ORDER — HYDROCODONE-ACETAMINOPHEN 5-325 MG PO TABS: 1.0000 | ORAL_TABLET | Freq: Four times a day (QID) | ORAL | Status: DC | PRN

## 2012-03-11 ENCOUNTER — Other Ambulatory Visit: Payer: Self-pay | Admitting: Internal Medicine

## 2012-03-12 ENCOUNTER — Other Ambulatory Visit: Payer: Self-pay | Admitting: Internal Medicine

## 2012-03-14 ENCOUNTER — Telehealth: Payer: Self-pay | Admitting: Internal Medicine

## 2012-03-14 NOTE — Telephone Encounter (Signed)
Patient Information:  Caller Name: [place]  Phone: [phone]  Patient: [name], [name]  Gender: Male  DOB: Mar 28, 1918  Age: 79 Years  PCP: Eleonore Chiquito [facility])   Symptoms  Reason For Call & Symptoms: Benita/ caregiver answered phone and states no longer needs assistance as patient has appointment with [facility] this date.  Relates he has had 2 episodes of Diarrhea in past week.  Information noted and sent to office;  No Protocol Available - Information Only protocol used.  Reviewed Health History In EMR: N/A  Reviewed Medications In EMR: N/A  Reviewed Allergies In EMR: N/A  Reviewed Surgeries / Procedures: N/A  Date of Onset of Symptoms: Unknown  Guideline(s) Used:  No Protocol Available - Information Only  Disposition Per Guideline:   Home Care  Reason For Disposition Reached:   Information only question and nurse able to answer  Advice Given:  N/A  Office Follow Up:  Does the office need to follow up with this patient?: No  Instructions For The Office: N/A

## 2012-03-28 ENCOUNTER — Telehealth: Payer: Self-pay | Admitting: Internal Medicine

## 2012-03-28 NOTE — Telephone Encounter (Signed)
Returned call from [place].  No answer left voicemail to return call if further assistance needed.

## 2012-03-29 ENCOUNTER — Encounter: Payer: Self-pay | Admitting: Internal Medicine

## 2012-03-29 ENCOUNTER — Ambulatory Visit (INDEPENDENT_AMBULATORY_CARE_PROVIDER_SITE_OTHER): Payer: Medicare Other | Admitting: Internal Medicine

## 2012-03-29 VITALS — BP 110/70 | HR 68 | Temp 97.6°F | Resp 20 | Wt 119.0 lb

## 2012-03-29 DIAGNOSIS — R51 Headache: Secondary | ICD-10-CM

## 2012-03-29 DIAGNOSIS — J4489 Other specified chronic obstructive pulmonary disease: Secondary | ICD-10-CM

## 2012-03-29 DIAGNOSIS — J441 Chronic obstructive pulmonary disease with (acute) exacerbation: Secondary | ICD-10-CM

## 2012-03-29 DIAGNOSIS — R300 Dysuria: Secondary | ICD-10-CM

## 2012-03-29 DIAGNOSIS — R3915 Urgency of urination: Secondary | ICD-10-CM

## 2012-03-29 DIAGNOSIS — R531 Weakness: Secondary | ICD-10-CM

## 2012-03-29 DIAGNOSIS — J449 Chronic obstructive pulmonary disease, unspecified: Secondary | ICD-10-CM

## 2012-03-29 LAB — POCT URINALYSIS DIPSTICK
Bilirubin, UA: NEGATIVE
Glucose, UA: NEGATIVE
Ketones, UA: NEGATIVE
Leukocytes, UA: NEGATIVE
Nitrite, UA: NEGATIVE
Spec Grav, UA: 1.01
Urobilinogen, UA: 0.2
pH, UA: 8

## 2012-03-29 NOTE — Progress Notes (Signed)
Subjective:    Patient ID: [name], male    DOB: 1917-04-09, 78 y.o.   MRN: [id number]  HPI  78 year old patient who has a chronic atrial fibrillation chronic systolic heart failure and COPD. His chief complaint today is paroxysms of right neck and occipital headaches that have been present over the past 2 months. These episodes are quite brief and he states they occur approximately 4 times per week. However during the period of examination he had 2 episodes of very brief headache but caused mild distress. His cardiac component status seems stable.  Past Medical History  Diagnosis Date  . Abnormality of gait 09/28/2008  . Acquired deformity of chest and rib 01/14/2009  . Atrial fibrillation 09/30/2006  . BENIGN PROSTATIC HYPERTROPHY, WITH URINARY OBSTRUCTION 09/04/2009  . CHRONIC OBSTRUCTIVE PULMONARY DISEASE, ACUTE EXACERBATION 03/24/2007  . Chronic systolic heart failure 08/06/2008  . COPD 09/30/2006  . CORONARY ARTERY DISEASE 10/01/2006  . GERD 04/19/2007  . HYPERTENSION 09/30/2006  . HYPOTHYROIDISM 09/30/2006  . LASSITUDE 06/13/2007  . Osteoarth NOS-Unspec 09/30/2006  . PEPTIC ULCER DISEASE 09/30/2006  . PERIPHERAL VASCULAR DISEASE 09/30/2006  . SPINAL STENOSIS 08/21/2009  . STROKE 08/28/2008    History   Social History  . Marital Status: Married    Spouse Name: N/A    Number of Children: N/A  . Years of Education: N/A   Occupational History  . 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 Smoking status: Former Smoker -- 0.8 packs/day for 20 years    Types: Cigarettes    Quit date: 04/06/1948  . Smokeless tobacco: Never Used  . Alcohol Use: No  . Drug Use: No  . Sexually Active: Not on file   Other Topics Concern  . Not on file   Social History Narrative   Lives at home with wife and has caretaker, still ambulatory daily with a walker. Had 2 children, 1 passed away.      Past Surgical History  Procedure Date  .  Appendectomy   . Hernia repair   . Tonsillectomy   . Abdominal aortic aneurysm repair   . Transurethral resection of prostate   . Lumbar laminectomy   . Laryngoscopy and esophagoscopy 02/10/2012    Procedure: LARYNGOSCOPY AND ESOPHAGOSCOPY;  Surgeon: Flo Shanks, MD;  Location: [facility];  Service: ENT;  Laterality: N/A;  DIRECT LARYNGOSCOPY/REMOVAL FOREIGN BODY FROM ESOPHAGUS    No family history on file.  Allergies  Allergen Reactions  . Bleph-10 (Sulfacetamide Sodium)   . Procaine Hcl     REACTION: syncope    Current Outpatient Prescriptions on File Prior to Visit  Medication Sig Dispense Refill  . budesonide-formoterol (SYMBICORT) 160-4.5 MCG/ACT inhaler Inhale 2 puffs into the lungs 2 (two) times daily.  1 Inhaler  12  . carvedilol (COREG) 6.25 MG tablet Take 3.125 mg by mouth 2 (two) times daily with a meal. One half tablet twice daily      . clopidogrel (PLAVIX) 75 MG tablet Take 75 mg by mouth daily.      . digoxin (LANOXIN) 0.125 MG tablet Take 125 mcg by mouth daily.      Marland Kitchen dutasteride (AVODART) 0.5 MG capsule Take 1 capsule (0.5 mg total) by mouth daily.  50 capsule  3  . fluticasone (FLONASE) 50 MCG/ACT nasal spray Place 2 sprays into the nose daily.  1 g  0  . furosemide (LASIX) 20 MG tablet Take 1 tablet (20  mg total) by mouth daily.  90 tablet  3  . HYDROcodone-acetaminophen (NORCO/VICODIN) 5-325 MG per tablet Take 1 tablet by mouth every 6 (six) hours as needed.  50 tablet  3  . ipratropium (ATROVENT) 0.02 % nebulizer solution Take 250 mcg by nebulization 2 (two) times daily.      Marland Kitchen ipratropium (ATROVENT) 0.02 % nebulizer solution USE VIAL NEBULIZER TWICE A DAY  62.5 mL  5  . levothyroxine (SYNTHROID, LEVOTHROID) 50 MCG tablet Take 50 mcg by mouth daily.      Marland Kitchen LORazepam (ATIVAN) 0.5 MG tablet Take 0.5 mg by mouth every 8 (eight) hours as needed. Anxiety      . pantoprazole (PROTONIX) 40 MG tablet Take 40 mg by mouth daily.      . potassium chloride (K-DUR,KLOR-CON) 10  MEQ tablet Take 10 mEq by mouth daily.      Marland Kitchen spironolactone (ALDACTONE) 25 MG tablet Take 25 mg by mouth 2 (two) times daily.      . Tamsulosin HCl (FLOMAX) 0.4 MG CAPS Take 0.4 mg by mouth daily.      . traMADol (ULTRAM) 50 MG tablet Take 50 mg by mouth every 6 (six) hours as needed. For pain      . traMADol (ULTRAM) 50 MG tablet TAKE 1 TABLET TWICE A DAY AS NEEDED  180 tablet  1  . clindamycin (CLEOCIN) 300 MG capsule Take 1 capsule (300 mg total) by mouth every 6 (six) hours.  40 capsule  0    BP 110/70  Pulse 68  Temp 97.6 F (36.4 C) (Oral)  Resp 20  Wt 119 lb (53.978 kg)       Review of Systems  Constitutional: Positive for fatigue. Negative for fever, chills and appetite change.  HENT: Negative for hearing loss, ear pain, congestion, sore throat, trouble swallowing, neck stiffness, dental problem, voice change and tinnitus.   Eyes: Negative for pain, discharge and visual disturbance.  Respiratory: Negative for cough, chest tightness, wheezing and stridor.   Cardiovascular: Negative for chest pain, palpitations and leg swelling.  Gastrointestinal: Negative for nausea, vomiting, abdominal pain, diarrhea, constipation, blood in stool and abdominal distention.  Genitourinary: Negative for urgency, hematuria, flank pain, discharge, difficulty urinating and genital sores.  Musculoskeletal: Negative for myalgias, back pain, joint swelling, arthralgias and gait problem.  Skin: Negative for rash.  Neurological: Positive for weakness and headaches. Negative for dizziness, syncope, speech difficulty and numbness.  Hematological: Negative for adenopathy. Does not bruise/bleed easily.  Psychiatric/Behavioral: Negative for behavioral problems and dysphoric mood. The patient is not nervous/anxious.        Objective:   Physical Exam  Constitutional: He is oriented to person, place, and time. He appears well-developed.  HENT:  Head: Normocephalic.  Right Ear: External ear normal.   Left Ear: External ear normal.  Eyes: Conjunctivae normal and EOM are normal.  Neck: Normal range of motion.  Cardiovascular: Normal rate and normal heart sounds.   Pulmonary/Chest:       Diminished breath sounds but clear  Abdominal: Bowel sounds are normal.  Musculoskeletal: Normal range of motion. He exhibits no edema and no tenderness.  Neurological: He is alert and oriented to person, place, and time.  Psychiatric: He has a normal mood and affect. His behavior is normal.          Assessment & Plan:   Headaches. This seems more compatible with the spasm in the posterior neck and occipital scalp musculature episodes are fairly intense but fleeting. Stretching  and range of motion exercises discussed and encouraged COPD stable

## 2012-03-29 NOTE — Patient Instructions (Signed)
Stretching and range of motion exercises of the head and neck as discussed  Call if unimproved

## 2012-04-15 ENCOUNTER — Other Ambulatory Visit: Payer: Self-pay | Admitting: Internal Medicine

## 2012-04-17 ENCOUNTER — Other Ambulatory Visit: Payer: Self-pay | Admitting: Internal Medicine

## 2012-04-18 NOTE — Telephone Encounter (Signed)
ok 

## 2012-04-18 NOTE — Telephone Encounter (Signed)
Called pharmacy and spoke pharmacist regarding Hydrocodone Rx refill request sent thru electronic.  Pt picked up Rx on 12/5, 12/15, 12/26, 1/3 and was dispensed 50 pills each time. Requesting refill. Told pharmacist I will get back to him.

## 2012-04-20 NOTE — Telephone Encounter (Signed)
Called pt he was resting and spoke to caregiver Nita told her I was calling regarding his Hydrocodone and refills. Janett Billow said can cancel that pt does not want to take it anymore constipates him, is taking Advil instead. Told Nita okay I will not refill medication but if he takes it can only take a max of 3 a day per Dr. Amador Cunas. Nita verbalized understanding.  Called [facility] pharmacy and spoke to [place] told him to cancel refill request pt does not want it. Rosanne Ashing verbalized understanding.

## 2012-04-22 ENCOUNTER — Telehealth: Payer: Self-pay | Admitting: Internal Medicine

## 2012-04-22 MED ORDER — CELECOXIB 200 MG PO CAPS: 200.0000 mg | ORAL_CAPSULE | Freq: Every day | ORAL | Status: DC

## 2012-04-22 NOTE — Telephone Encounter (Signed)
Spoke to pt told him Rx refill for Celebrex was sent to [place] Dr. Florian Buff. Pt verbalized understanding.

## 2012-04-22 NOTE — Telephone Encounter (Signed)
Patient's caregiver Milda Smart called stating that patient is going to now get his rxs throught the [place] and they need an rx for his celebrex 200mg  1poqd prn for arthritis along with the diagnosis code of the associated problem faxed to the [place] Dr. Florian Buff fax # [phone]. Please assist.

## 2012-04-26 ENCOUNTER — Encounter (HOSPITAL_COMMUNITY): Payer: Self-pay | Admitting: Emergency Medicine

## 2012-04-26 ENCOUNTER — Emergency Department (HOSPITAL_COMMUNITY): Payer: Medicare Other

## 2012-04-26 ENCOUNTER — Emergency Department (HOSPITAL_COMMUNITY)
Admission: EM | Admit: 2012-04-26 | Discharge: 2012-04-26 | Disposition: A | Discharge status: Home / Self Care | Payer: Medicare Other | Attending: Emergency Medicine | Admitting: Emergency Medicine

## 2012-04-26 DIAGNOSIS — E039 Hypothyroidism, unspecified: Secondary | ICD-10-CM | POA: Insufficient documentation

## 2012-04-26 DIAGNOSIS — Z8739 Personal history of other diseases of the musculoskeletal system and connective tissue: Secondary | ICD-10-CM | POA: Insufficient documentation

## 2012-04-26 DIAGNOSIS — Z8711 Personal history of peptic ulcer disease: Secondary | ICD-10-CM | POA: Insufficient documentation

## 2012-04-26 DIAGNOSIS — K219 Gastro-esophageal reflux disease without esophagitis: Secondary | ICD-10-CM | POA: Insufficient documentation

## 2012-04-26 DIAGNOSIS — IMO0002 Reserved for concepts with insufficient information to code with codable children: Secondary | ICD-10-CM | POA: Insufficient documentation

## 2012-04-26 DIAGNOSIS — I10 Essential (primary) hypertension: Secondary | ICD-10-CM | POA: Insufficient documentation

## 2012-04-26 DIAGNOSIS — R269 Unspecified abnormalities of gait and mobility: Secondary | ICD-10-CM | POA: Insufficient documentation

## 2012-04-26 DIAGNOSIS — J4489 Other specified chronic obstructive pulmonary disease: Secondary | ICD-10-CM | POA: Insufficient documentation

## 2012-04-26 DIAGNOSIS — Y9389 Activity, other specified: Secondary | ICD-10-CM | POA: Insufficient documentation

## 2012-04-26 DIAGNOSIS — Z87448 Personal history of other diseases of urinary system: Secondary | ICD-10-CM | POA: Insufficient documentation

## 2012-04-26 DIAGNOSIS — S6990XA Unspecified injury of unspecified wrist, hand and finger(s), initial encounter: Secondary | ICD-10-CM

## 2012-04-26 DIAGNOSIS — Y92009 Unspecified place in unspecified non-institutional (private) residence as the place of occurrence of the external cause: Secondary | ICD-10-CM | POA: Insufficient documentation

## 2012-04-26 DIAGNOSIS — I5022 Chronic systolic (congestive) heart failure: Secondary | ICD-10-CM | POA: Insufficient documentation

## 2012-04-26 DIAGNOSIS — I2510 Atherosclerotic heart disease of native coronary artery without angina pectoris: Secondary | ICD-10-CM | POA: Insufficient documentation

## 2012-04-26 DIAGNOSIS — Z7902 Long term (current) use of antithrombotics/antiplatelets: Secondary | ICD-10-CM | POA: Insufficient documentation

## 2012-04-26 DIAGNOSIS — Z79899 Other long term (current) drug therapy: Secondary | ICD-10-CM | POA: Insufficient documentation

## 2012-04-26 DIAGNOSIS — S61409A Unspecified open wound of unspecified hand, initial encounter: Secondary | ICD-10-CM | POA: Insufficient documentation

## 2012-04-26 DIAGNOSIS — I739 Peripheral vascular disease, unspecified: Secondary | ICD-10-CM | POA: Insufficient documentation

## 2012-04-26 DIAGNOSIS — Z8673 Personal history of transient ischemic attack (TIA), and cerebral infarction without residual deficits: Secondary | ICD-10-CM | POA: Insufficient documentation

## 2012-04-26 DIAGNOSIS — W2209XA Striking against other stationary object, initial encounter: Secondary | ICD-10-CM | POA: Insufficient documentation

## 2012-04-26 DIAGNOSIS — M810 Age-related osteoporosis without current pathological fracture: Secondary | ICD-10-CM | POA: Insufficient documentation

## 2012-04-26 DIAGNOSIS — Z87891 Personal history of nicotine dependence: Secondary | ICD-10-CM | POA: Insufficient documentation

## 2012-04-26 DIAGNOSIS — I4891 Unspecified atrial fibrillation: Secondary | ICD-10-CM | POA: Insufficient documentation

## 2012-04-26 DIAGNOSIS — J449 Chronic obstructive pulmonary disease, unspecified: Secondary | ICD-10-CM | POA: Insufficient documentation

## 2012-04-26 IMAGING — CT CT ABD-PELV W/ CM
2 of 6 series · 17 of 46 positions shown, 19 images · IV contrast (omnipaque)
Comparison: 07/31/2010

CLINICAL DATA: Left lower quadrant pain

CT ABDOMEN AND PELVIS WITH CONTRAST
TECHNIQUE: Multidetector CT imaging of the abdomen and pelvis was
performed following the standard protocol during bolus
administration of intravenous contrast.
Contrast: 80mL OMNIPAQUE IOHEXOL 300 MG/ML IV SOLN

[Series 2: rtn a/p with · axial · 0.63mm/px · z∈[-228,+146]mm · 14 of 85 slices shown, 16 images]
[im 5/85  soft-tissue]
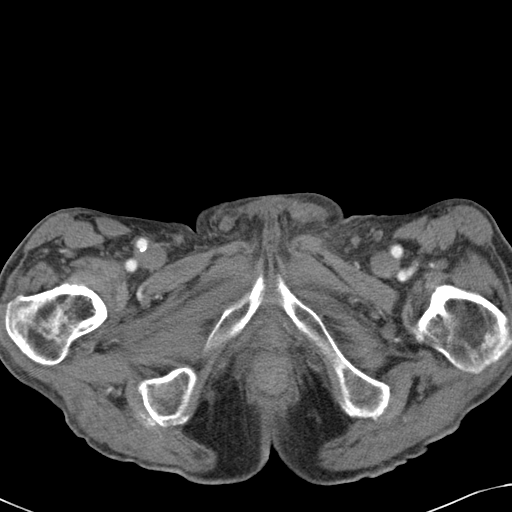
[im 5/85  bone]
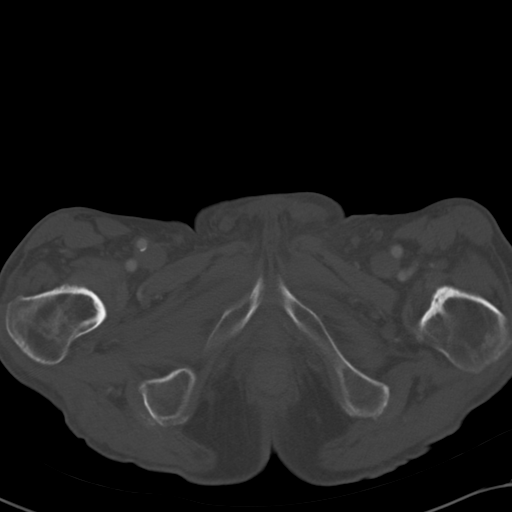
[im 13/85  soft-tissue]
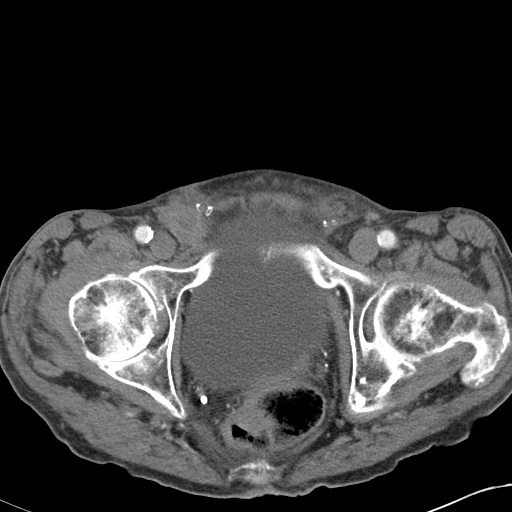
[im 17/85  soft-tissue]
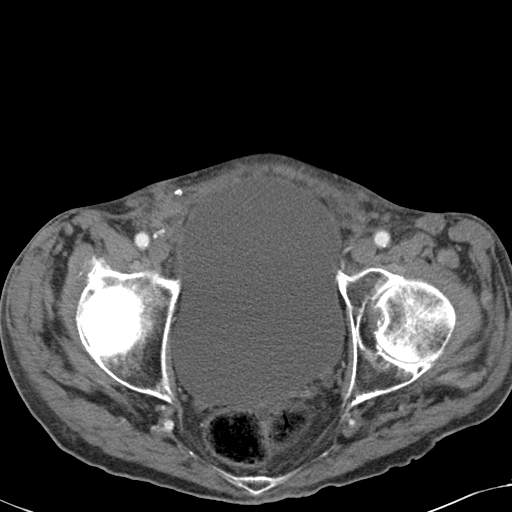
[im 22/85  soft-tissue]
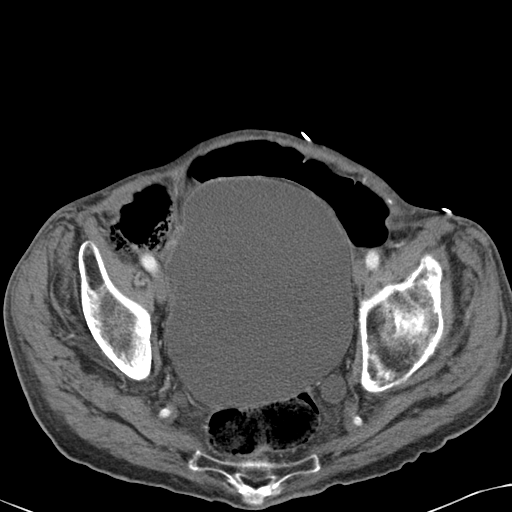
[im 30/85  soft-tissue]
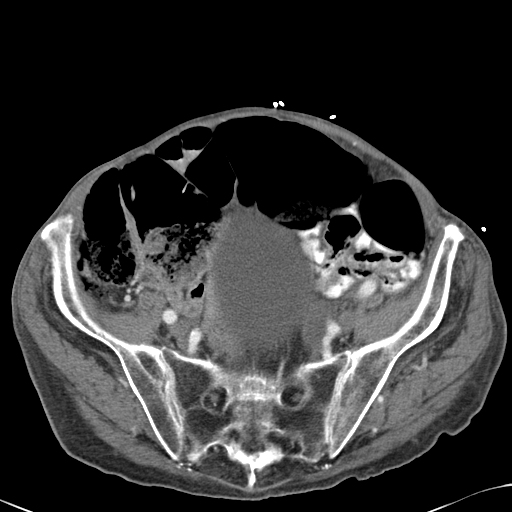
[im 34/85  soft-tissue]
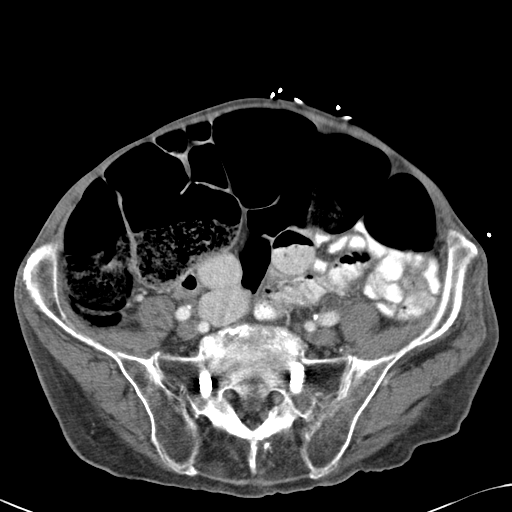
[im 38/85  soft-tissue]
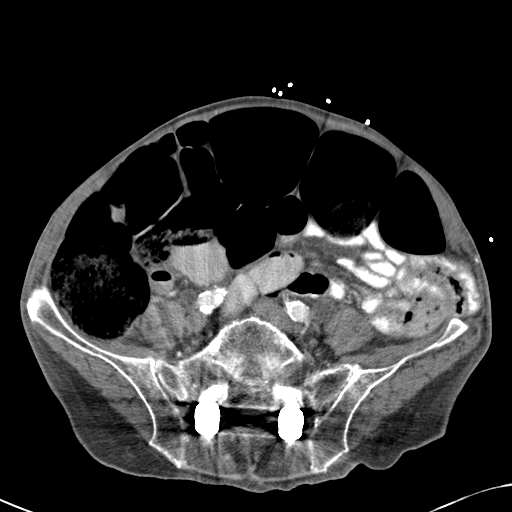
[im 47/85  soft-tissue]
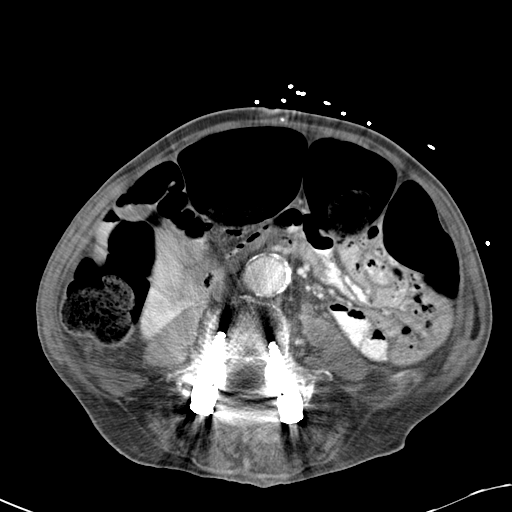
[im 51/85  soft-tissue]
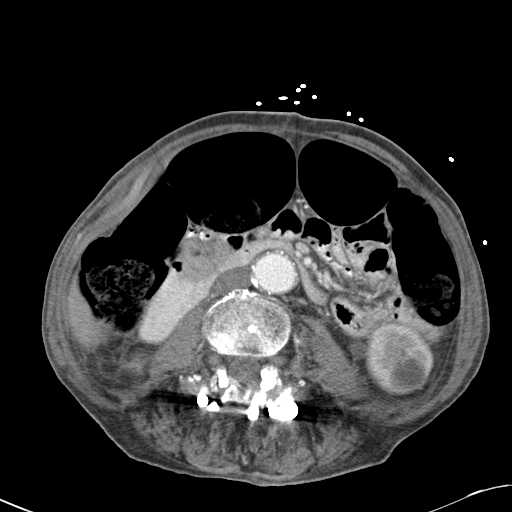
[im 51/85  bone]
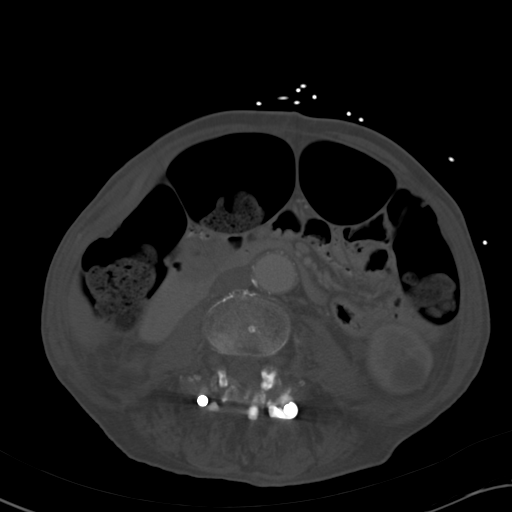
[im 55/85  soft-tissue]
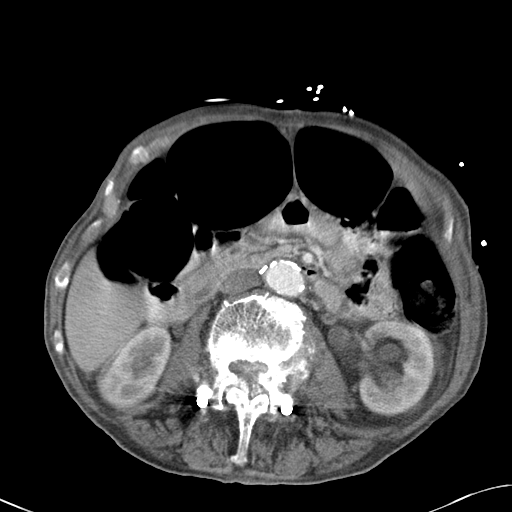
[im 64/85  soft-tissue]
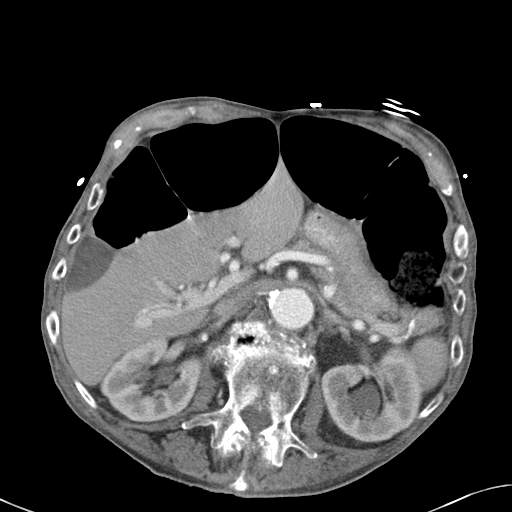
[im 68/85  soft-tissue]
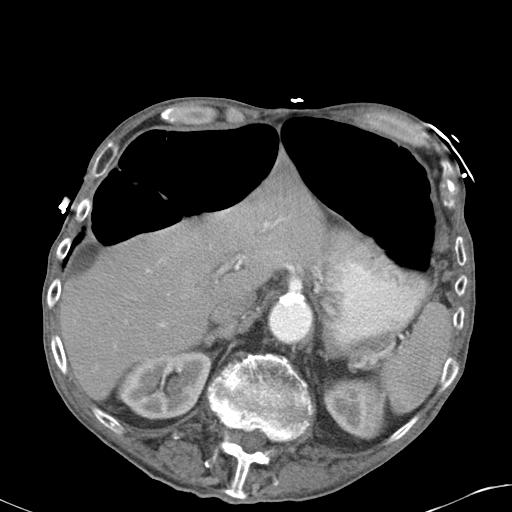
[im 72/85  soft-tissue]
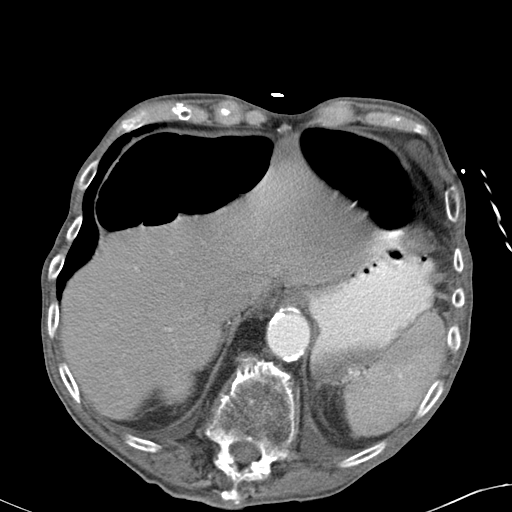
[im 80/85  soft-tissue]
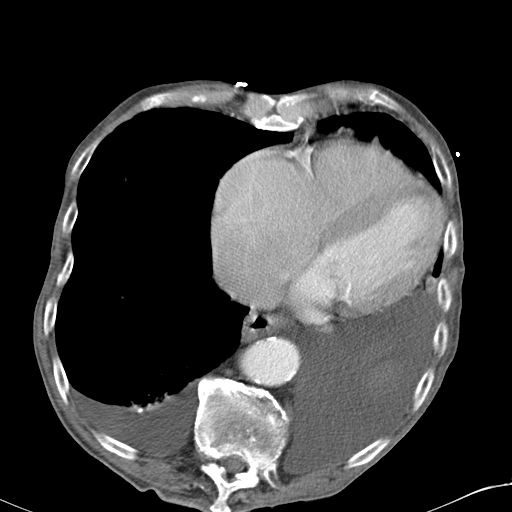

[Series 603: <mpr thick range(1)> · coronal · 0.83mm/px · 3 of 80 slices shown]
[im 16/80  soft-tissue]
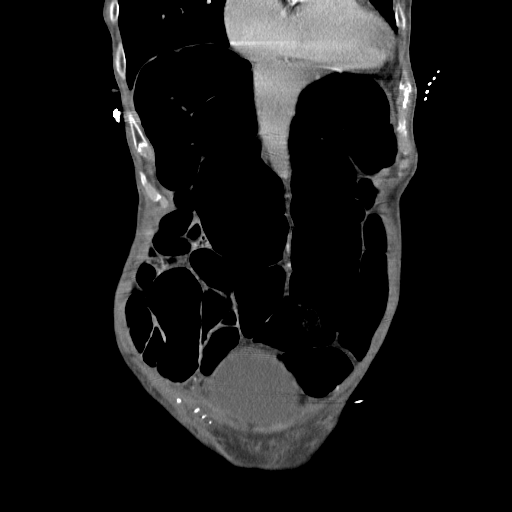
[im 32/80  soft-tissue]
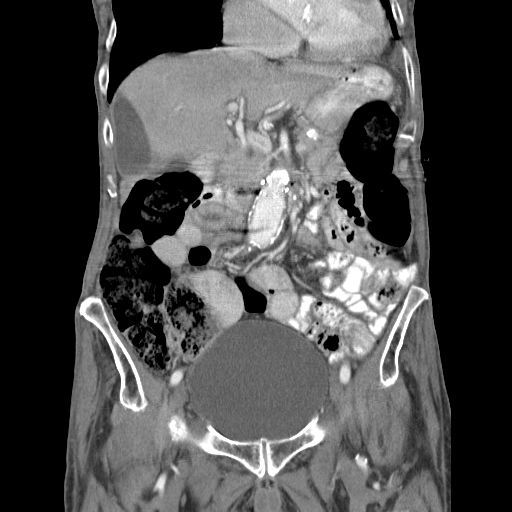
[im 48/80  soft-tissue]
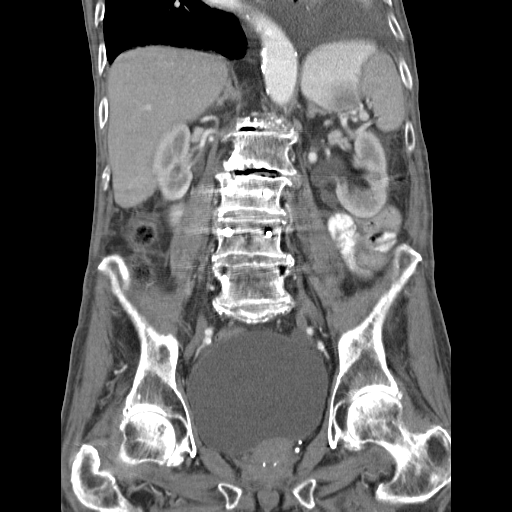

[17 of 46 positions shown; findings below may reference images not displayed]

FINDINGS: Partially imaged bilateral pleural effusions.  Associated
consolidations; atelectasis versus pneumonia.  Coronary artery,
aortic and mitral valve calcifications.

Unremarkable liver and spleen.  Mild fullness of the extrahepatic
duct and pancreatic duct, similar to prior.  There is atrophy of
the pancreas.  Bilateral adreniform nodularity/enlargement without
a dominant measurable nodule.  No intrahepatic biliary ductal
dilatation.

Left renal cyst has mildly enlarged.  Bilateral
hydroureteronephrosis without obstructing lesion.  The bladder is
markedly distended. Enlarged, lobular prostate gland.

Large amount of gas and stool distends the colon.  Small bowel
loops are normal caliber.

Surgical changes in the bilateral groins.  Soft tissue in the right
inguinal region may represent sequelae of hernia repair or
hematoma.

Advanced atherosclerosis of the aorta and its branches.  There are
areas of ectasia without dissection identified.

Advanced multilevel degenerative changes with surgical fusion
posteriorly from L2-S1.
IMPRESSION: Bilateral hydroureteronephrosis to the level of a markedly
distended bladder. Query partial outlet obstruction.

Large amount of air and stool throughout the colon.  No obstruction
or volvulus identified. Recommend a KUB follow-up in 6-12 hours to
document passage of ingested contrast into the colon.

Bilateral pleural effusions with associated consolidations;
atelectasis versus pneumonia.

Mixed attenuation within the right inguinal region may represent
sequelae of hernia repair/small hematoma.

## 2012-04-26 NOTE — ED Provider Notes (Signed)
History     CSN: [id number]  Arrival date & time 04/26/12  1715   First MD Initiated Contact with Patient 04/26/12 1731      Chief Complaint  Patient presents with  . Hand Injury    (Consider location/radiation/quality/duration/timing/severity/associated sxs/prior treatment) HPI Comments: Patient is a 79 year old male who presents with a laceration of his left hand. Patient reports "catching" his hand on a lamp this morning, causing his skin to tear. Patient reports persistent bleeding for 4 hours following the laceration. Patient reports taking Plavix daily. Patient reports aching moderate pain at the laceration site. Pain does not radiate. Patient reports holding pressure on his hand to control bleeding which did not help. No aggravating/alleviating factors.    Past Medical History  Diagnosis Date  . Abnormality of gait 09/28/2008  . Acquired deformity of chest and rib 01/14/2009  . Atrial fibrillation 09/30/2006  . BENIGN PROSTATIC HYPERTROPHY, WITH URINARY OBSTRUCTION 09/04/2009  . CHRONIC OBSTRUCTIVE PULMONARY DISEASE, ACUTE EXACERBATION 03/24/2007  . Chronic systolic heart failure 08/06/2008  . COPD 09/30/2006  . CORONARY ARTERY DISEASE 10/01/2006  . GERD 04/19/2007  . HYPERTENSION 09/30/2006  . HYPOTHYROIDISM 09/30/2006  . LASSITUDE 06/13/2007  . Osteoarth NOS-Unspec 09/30/2006  . PEPTIC ULCER DISEASE 09/30/2006  . PERIPHERAL VASCULAR DISEASE 09/30/2006  . SPINAL STENOSIS 08/21/2009  . STROKE 08/28/2008    Past Surgical History  Procedure Date  . Appendectomy   . Hernia repair   . Tonsillectomy   . Abdominal aortic aneurysm repair   . Transurethral resection of prostate   . Lumbar laminectomy   . Laryngoscopy and esophagoscopy 02/10/2012    Procedure: LARYNGOSCOPY AND ESOPHAGOSCOPY;  Surgeon: Flo Shanks, MD;  Location: [facility];  Service: ENT;  Laterality: N/A;  DIRECT LARYNGOSCOPY/REMOVAL FOREIGN BODY FROM ESOPHAGUS    No family history on file.  History  Substance Use  Topics  . Smoking status: Former Smoker -- 0.8 packs/day for 20 years    Types: Cigarettes    Quit date: 04/06/1948  . Smokeless tobacco: Never Used  . Alcohol Use: No      Review of Systems  Skin: Positive for wound.  All other systems reviewed and are negative.    Allergies  Bleph-10 and Procaine hcl  Home Medications   Current Outpatient Rx  Name  Route  Sig  Dispense  Refill  . BUDESONIDE-FORMOTEROL FUMARATE 160-4.5 MCG/ACT IN AERO   Inhalation   Inhale 2 puffs into the lungs 2 (two) times daily.   1 Inhaler   12   . CARVEDILOL 6.25 MG PO TABS   Oral   Take 3.125 mg by mouth 2 (two) times daily with a meal. One half tablet twice daily         . CELECOXIB 200 MG PO CAPS   Oral   Take 1 capsule (200 mg total) by mouth daily.   90 capsule   3     Diagnosis: 715.90 Osteoarthritis, NOS Unspecified   . CLOPIDOGREL BISULFATE 75 MG PO TABS   Oral   Take 75 mg by mouth daily.         Marland Kitchen DIGOXIN 0.125 MG PO TABS   Oral   Take 125 mcg by mouth daily.         . DUTASTERIDE 0.5 MG PO CAPS   Oral   Take 1 capsule (0.5 mg total) by mouth daily.   50 capsule   3   . FLUTICASONE PROPIONATE 50 MCG/ACT NA SUSP   Nasal  Place 2 sprays into the nose daily.   1 g   0   . FUROSEMIDE 20 MG PO TABS   Oral   Take 1 tablet (20 mg total) by mouth daily.   90 tablet   3   . HYDROCODONE-ACETAMINOPHEN 5-325 MG PO TABS   Oral   Take 1 tablet by mouth every 6 (six) hours as needed.   50 tablet   3   . IPRATROPIUM BROMIDE 0.02 % IN SOLN   Nebulization   Take 250 mcg by nebulization 2 (two) times daily.         . IPRATROPIUM BROMIDE 0.02 % IN SOLN      USE VIAL NEBULIZER TWICE A DAY   62.5 mL   5     DX Code Needed .   Marland Kitchen LEVOTHYROXINE SODIUM 50 MCG PO TABS   Oral   Take 50 mcg by mouth daily.         Marland Kitchen LORAZEPAM 0.5 MG PO TABS   Oral   Take 0.5 mg by mouth every 8 (eight) hours as needed. Anxiety         . PANTOPRAZOLE SODIUM 40 MG PO TBEC    Oral   Take 40 mg by mouth daily.         Marland Kitchen POTASSIUM CHLORIDE CRYS ER 10 MEQ PO TBCR   Oral   Take 10 mEq by mouth daily.         Marland Kitchen SPIRONOLACTONE 25 MG PO TABS   Oral   Take 25 mg by mouth 2 (two) times daily.         Marland Kitchen TAMSULOSIN HCL 0.4 MG PO CAPS   Oral   Take 0.4 mg by mouth daily.         . TRAMADOL HCL 50 MG PO TABS   Oral   Take 50 mg by mouth every 6 (six) hours as needed. For pain           BP 141/76  Pulse 70  Temp 98 F (36.7 C) (Oral)  Resp 18  SpO2 99%  Physical Exam  Nursing note and vitals reviewed. Constitutional: He is oriented to person, place, and time. He appears well-developed and well-nourished. No distress.  HENT:  Head: Normocephalic and atraumatic.  Eyes: Conjunctivae normal are normal.  Neck: Normal range of motion.  Cardiovascular: Normal rate and regular rhythm.  Exam reveals no gallop and no friction rub.   No murmur heard.      Sufficient capillary refill of distal extremities.   Pulmonary/Chest: Effort normal and breath sounds normal. He has no wheezes. He has no rales. He exhibits no tenderness.  Abdominal: Soft. He exhibits no distension.  Musculoskeletal: Normal range of motion.  Neurological: He is alert and oriented to person, place, and time. Coordination normal.       Speech is goal-oriented. Moves limbs without ataxia.   Skin: Skin is warm and dry.       4 cm laceration of left dorsal hand with surrounding bruising.   Psychiatric: He has a normal mood and affect. His behavior is normal.    ED Course  Procedures (including critical care time)  LACERATION REPAIR Performed by: Emilia Beck Authorized by: Emilia Beck Consent: Verbal consent obtained. Risks and benefits: risks, benefits and alternatives were discussed Consent given by: patient Patient identity confirmed: provided demographic data Prepped and Draped in normal sterile fashion Wound explored  Laceration Location: left dorsal  hand  Laceration Length: 4 cm  No Foreign Bodies seen or palpated  Anesthesia: none  Anesthetic total: 0 ml  Irrigation method: syringe Amount of cleaning: standard  Skin closure: steri strips  Number of sutures: 5 steri strips  Patient tolerance: Patient tolerated the procedure well with no immediate complications.   Labs Reviewed - No data to display Dg Hand Complete Left  04/26/2012  *RADIOLOGY REPORT*  Clinical Data: Laceration.  Pain.  LEFT HAND - COMPLETE 3+ VIEW  Comparison: None.  Findings: No fracture, dislocation or radiopaque foreign body is identified.  The patient has advanced degenerative disease at all of the DIP joints and IP joint of the thumb.  Soft tissue structures unremarkable. Bones appear osteopenic.  IMPRESSION: No acute abnormality.   Original Report Authenticated By: Holley Dexter, M.D.      1. Hand injury   2. Laceration       MDM  6:00 PM Patient will have left hand xray. I will suture the wound. No neurovascular compromise.   7:21 PM Xray unremarkable. Laceration repaired without complications. Patient will be discharged without further evaluation.       Emilia Beck, PA-C 04/26/12 1925

## 2012-04-26 NOTE — ED Notes (Signed)
PER EMS- pt picked up from home with c/o skin tear on l hand.  Reports that he caught his hand on lamp this morning at 10a.  Bleeding x4hours.  Arrived to EMS with bleeding controlled and wrapped with gauze.

## 2012-04-26 NOTE — ED Notes (Signed)
Patient transported to X-ray 

## 2012-04-27 NOTE — ED Provider Notes (Signed)
Medical screening examination/treatment/procedure(s) were performed by non-physician practitioner and as supervising physician I was immediately available for consultation/collaboration.    Shelda Jakes, MD 04/27/12 [phone]

## 2012-04-30 ENCOUNTER — Encounter (HOSPITAL_COMMUNITY): Payer: Self-pay | Admitting: *Deleted

## 2012-04-30 ENCOUNTER — Emergency Department (HOSPITAL_COMMUNITY)
Admission: EM | Admit: 2012-04-30 | Discharge: 2012-04-30 | Disposition: A | Discharge status: Home / Self Care | Payer: Medicare Other | Attending: Emergency Medicine | Admitting: Emergency Medicine

## 2012-04-30 DIAGNOSIS — R197 Diarrhea, unspecified: Secondary | ICD-10-CM

## 2012-04-30 DIAGNOSIS — I5020 Unspecified systolic (congestive) heart failure: Secondary | ICD-10-CM | POA: Insufficient documentation

## 2012-04-30 DIAGNOSIS — Z9089 Acquired absence of other organs: Secondary | ICD-10-CM | POA: Insufficient documentation

## 2012-04-30 DIAGNOSIS — Z8711 Personal history of peptic ulcer disease: Secondary | ICD-10-CM | POA: Insufficient documentation

## 2012-04-30 DIAGNOSIS — Z8673 Personal history of transient ischemic attack (TIA), and cerebral infarction without residual deficits: Secondary | ICD-10-CM | POA: Insufficient documentation

## 2012-04-30 DIAGNOSIS — J449 Chronic obstructive pulmonary disease, unspecified: Secondary | ICD-10-CM | POA: Insufficient documentation

## 2012-04-30 DIAGNOSIS — E860 Dehydration: Secondary | ICD-10-CM

## 2012-04-30 DIAGNOSIS — I2510 Atherosclerotic heart disease of native coronary artery without angina pectoris: Secondary | ICD-10-CM | POA: Insufficient documentation

## 2012-04-30 DIAGNOSIS — J441 Chronic obstructive pulmonary disease with (acute) exacerbation: Secondary | ICD-10-CM | POA: Insufficient documentation

## 2012-04-30 DIAGNOSIS — Z791 Long term (current) use of non-steroidal anti-inflammatories (NSAID): Secondary | ICD-10-CM | POA: Insufficient documentation

## 2012-04-30 DIAGNOSIS — E871 Hypo-osmolality and hyponatremia: Secondary | ICD-10-CM

## 2012-04-30 DIAGNOSIS — N401 Enlarged prostate with lower urinary tract symptoms: Secondary | ICD-10-CM | POA: Insufficient documentation

## 2012-04-30 DIAGNOSIS — IMO0002 Reserved for concepts with insufficient information to code with codable children: Secondary | ICD-10-CM | POA: Insufficient documentation

## 2012-04-30 DIAGNOSIS — Z8739 Personal history of other diseases of the musculoskeletal system and connective tissue: Secondary | ICD-10-CM | POA: Insufficient documentation

## 2012-04-30 DIAGNOSIS — I10 Essential (primary) hypertension: Secondary | ICD-10-CM | POA: Insufficient documentation

## 2012-04-30 DIAGNOSIS — J4489 Other specified chronic obstructive pulmonary disease: Secondary | ICD-10-CM | POA: Insufficient documentation

## 2012-04-30 DIAGNOSIS — E039 Hypothyroidism, unspecified: Secondary | ICD-10-CM | POA: Insufficient documentation

## 2012-04-30 DIAGNOSIS — N138 Other obstructive and reflux uropathy: Secondary | ICD-10-CM | POA: Insufficient documentation

## 2012-04-30 DIAGNOSIS — Z79899 Other long term (current) drug therapy: Secondary | ICD-10-CM | POA: Insufficient documentation

## 2012-04-30 DIAGNOSIS — Z7902 Long term (current) use of antithrombotics/antiplatelets: Secondary | ICD-10-CM | POA: Insufficient documentation

## 2012-04-30 DIAGNOSIS — Z87891 Personal history of nicotine dependence: Secondary | ICD-10-CM | POA: Insufficient documentation

## 2012-04-30 DIAGNOSIS — K219 Gastro-esophageal reflux disease without esophagitis: Secondary | ICD-10-CM | POA: Insufficient documentation

## 2012-04-30 DIAGNOSIS — Z8679 Personal history of other diseases of the circulatory system: Secondary | ICD-10-CM | POA: Insufficient documentation

## 2012-04-30 LAB — CBC WITH DIFFERENTIAL/PLATELET
Eosinophils Relative: 1 % (ref 0–5)
HCT: 35 % — ABNORMAL LOW (ref 39.0–52.0)
Lymphocytes Relative: 25 % (ref 12–46)
Lymphs Abs: 1.4 10*3/uL (ref 0.7–4.0)
MCV: 86 fL (ref 78.0–100.0)
Platelets: 176 10*3/uL (ref 150–400)
RBC: 4.07 MIL/uL — ABNORMAL LOW (ref 4.22–5.81)
WBC: 5.7 10*3/uL (ref 4.0–10.5)

## 2012-04-30 LAB — BASIC METABOLIC PANEL
CO2: 31 mEq/L (ref 19–32)
Calcium: 8.7 mg/dL (ref 8.4–10.5)
Chloride: 89 mEq/L — ABNORMAL LOW (ref 96–112)
Glucose, Bld: 97 mg/dL (ref 70–99)
Sodium: 124 mEq/L — ABNORMAL LOW (ref 135–145)

## 2012-04-30 LAB — OCCULT BLOOD, POC DEVICE: Fecal Occult Bld: NEGATIVE

## 2012-04-30 LAB — URINALYSIS, ROUTINE W REFLEX MICROSCOPIC
Glucose, UA: NEGATIVE mg/dL
Hgb urine dipstick: NEGATIVE
Protein, ur: NEGATIVE mg/dL
Specific Gravity, Urine: 1.019 (ref 1.005–1.030)
pH: 6.5 (ref 5.0–8.0)

## 2012-04-30 MED ORDER — SODIUM CHLORIDE 0.9 % IV SOLN: INTRAVENOUS | Status: DC

## 2012-04-30 MED ORDER — SODIUM CHLORIDE 0.9 % IV BOLUS (SEPSIS)
500.0000 mL | Freq: Once | INTRAVENOUS | Status: AC
  Administered 2012-04-30: 500 mL via INTRAVENOUS

## 2012-04-30 NOTE — ED Notes (Signed)
Pt sts diarrhea x 3 days. Pt denies abdominal pain, nausea, vomiting. Pt sts stools are loose, and sometimes dark , tarry color. Pt also reports increased weakness.

## 2012-04-30 NOTE — ED Notes (Signed)
Per EMS, patient has has diarrhea for two days, no N/V-patients states when it started it was black and tarry

## 2012-04-30 NOTE — ED Provider Notes (Signed)
History     CSN: [id number]  Arrival date & time 04/30/12  1825   First MD Initiated Contact with Patient 04/30/12 1932      Chief Complaint  Patient presents with  . Diarrhea    (Consider location/radiation/quality/duration/timing/severity/associated sxs/prior treatment) HPI Comments: [name] is a 79 y.o. Male who is here for evaluation of diarrhea that has been present for 2 days. Stool is very between dark and brown in color. He, states the diarrhea is better, now. He denies nausea, vomiting, fever, chills, cough, shortness of breath, chest pain, or abdominal pain. There are no modifying factors. He lives at home with his wife, and caretaker was there 38 /7.  Patient is a 79 y.o. male presenting with diarrhea. The history is provided by the patient.  Diarrhea The primary symptoms include diarrhea.    Past Medical History  Diagnosis Date  . Abnormality of gait 09/28/2008  . Acquired deformity of chest and rib 01/14/2009  . Atrial fibrillation 09/30/2006  . BENIGN PROSTATIC HYPERTROPHY, WITH URINARY OBSTRUCTION 09/04/2009  . CHRONIC OBSTRUCTIVE PULMONARY DISEASE, ACUTE EXACERBATION 03/24/2007  . Chronic systolic heart failure 08/06/2008  . COPD 09/30/2006  . CORONARY ARTERY DISEASE 10/01/2006  . GERD 04/19/2007  . HYPERTENSION 09/30/2006  . HYPOTHYROIDISM 09/30/2006  . LASSITUDE 06/13/2007  . Osteoarth NOS-Unspec 09/30/2006  . PEPTIC ULCER DISEASE 09/30/2006  . PERIPHERAL VASCULAR DISEASE 09/30/2006  . SPINAL STENOSIS 08/21/2009  . STROKE 08/28/2008    Past Surgical History  Procedure Date  . Appendectomy   . Hernia repair   . Tonsillectomy   . Abdominal aortic aneurysm repair   . Transurethral resection of prostate   . Lumbar laminectomy   . Laryngoscopy and esophagoscopy 02/10/2012    Procedure: LARYNGOSCOPY AND ESOPHAGOSCOPY;  Surgeon: Flo Shanks, MD;  Location: [facility];  Service: ENT;  Laterality: N/A;  DIRECT LARYNGOSCOPY/REMOVAL FOREIGN BODY FROM ESOPHAGUS     History reviewed. No pertinent family history.  History  Substance Use Topics  . Smoking status: Former Smoker -- 0.8 packs/day for 20 years    Types: Cigarettes    Quit date: 04/06/1948  . Smokeless tobacco: Never Used  . Alcohol Use: No      Review of Systems  Gastrointestinal: Positive for diarrhea.  All other systems reviewed and are negative.    Allergies  Bleph-10 and Procaine hcl  Home Medications   Current Outpatient Rx  Name  Route  Sig  Dispense  Refill  . BUDESONIDE-FORMOTEROL FUMARATE 160-4.5 MCG/ACT IN AERO   Inhalation   Inhale 2 puffs into the lungs 2 (two) times daily.   1 Inhaler   12   . CARVEDILOL 6.25 MG PO TABS   Oral   Take 3.125 mg by mouth 2 (two) times daily with a meal. One half tablet twice daily         . CELECOXIB 200 MG PO CAPS   Oral   Take 1 capsule (200 mg total) by mouth daily.   90 capsule   3     Diagnosis: 715.90 Osteoarthritis, NOS Unspecified   . CLOPIDOGREL BISULFATE 75 MG PO TABS   Oral   Take 75 mg by mouth daily.         Marland Kitchen DIGOXIN 0.125 MG PO TABS   Oral   Take 125 mcg by mouth daily.         . DUTASTERIDE 0.5 MG PO CAPS   Oral   Take 1 capsule (0.5 mg total) by mouth daily.  50 capsule   3   . FLUTICASONE PROPIONATE 50 MCG/ACT NA SUSP   Nasal   Place 2 sprays into the nose daily.   1 g   0   . FUROSEMIDE 20 MG PO TABS   Oral   Take 1 tablet (20 mg total) by mouth daily.   90 tablet   3   . HYDROCODONE-ACETAMINOPHEN 5-325 MG PO TABS   Oral   Take 1 tablet by mouth every 6 (six) hours as needed.   50 tablet   3   . IPRATROPIUM BROMIDE 0.02 % IN SOLN   Nebulization   Take 250 mcg by nebulization 2 (two) times daily.         . IPRATROPIUM BROMIDE 0.02 % IN SOLN      USE VIAL NEBULIZER TWICE A DAY   62.5 mL   5     DX Code Needed .   Marland Kitchen LEVOTHYROXINE SODIUM 50 MCG PO TABS   Oral   Take 50 mcg by mouth daily.         Marland Kitchen LORAZEPAM 0.5 MG PO TABS   Oral   Take 0.5 mg by  mouth every 8 (eight) hours as needed. Anxiety         . PANTOPRAZOLE SODIUM 40 MG PO TBEC   Oral   Take 40 mg by mouth daily.         Marland Kitchen POTASSIUM CHLORIDE CRYS ER 10 MEQ PO TBCR   Oral   Take 10 mEq by mouth daily.         Marland Kitchen SPIRONOLACTONE 25 MG PO TABS   Oral   Take 25 mg by mouth 2 (two) times daily.         Marland Kitchen TAMSULOSIN HCL 0.4 MG PO CAPS   Oral   Take 0.4 mg by mouth daily.         . TRAMADOL HCL 50 MG PO TABS   Oral   Take 50 mg by mouth every 6 (six) hours as needed. For pain           BP 143/91  Pulse 86  Temp 98.3 F (36.8 C) (Oral)  SpO2 100%  Physical Exam  Nursing note and vitals reviewed. Constitutional: He is oriented to person, place, and time. He appears well-developed. No distress.       Frail, elderly; undernourished  HENT:  Head: Normocephalic and atraumatic.  Right Ear: External ear normal.  Left Ear: External ear normal.  Eyes: Conjunctivae normal and EOM are normal. Pupils are equal, round, and reactive to light.  Neck: Normal range of motion and phonation normal. Neck supple.  Cardiovascular: Normal rate, regular rhythm, normal heart sounds and intact distal pulses.   Pulmonary/Chest: Effort normal and breath sounds normal. He exhibits no bony tenderness.  Abdominal: Soft. Normal appearance and bowel sounds are normal. There is tenderness (suprapubic, mild).  Genitourinary:       Brown stool in diaper and in rectum. No rectal mass. No palpable prostate  Musculoskeletal: Normal range of motion.  Neurological: He is alert and oriented to person, place, and time. He has normal strength. No cranial nerve deficit or sensory deficit. He exhibits normal muscle tone. Coordination normal.  Skin: Skin is warm, dry and intact.  Psychiatric: He has a normal mood and affect. His behavior is normal. Judgment and thought content normal.    ED Course  Procedures (including critical care time)  Emergency department treatment IV fluids , bolus,  and drip.  Reevaluation: 22:40- he is tolerating oral liquids and food. He has not had anymore diarrhea.  Labs Reviewed  CBC WITH DIFFERENTIAL - Abnormal; Notable for the following:    RBC 4.07 (*)     Hemoglobin 11.7 (*)     HCT 35.0 (*)     All other components within normal limits  BASIC METABOLIC PANEL - Abnormal; Notable for the following:    Sodium 124 (*)     Chloride 89 (*)     GFR calc non Af Amer 86 (*)     All other components within normal limits  URINALYSIS, ROUTINE W REFLEX MICROSCOPIC  OCCULT BLOOD, POC DEVICE   Nursing notes, applicable records and vitals reviewed.  Radiologic Images/Reports reviewed.    1. Diarrhea   2. Hyponatremia   3. Dehydration       MDM  Nonspecific, diarrhea, spontaneously improved. Mild hyponatremia, likely related to dehydration.Doubt metabolic instability, serious bacterial infection or impending vascular collapse; the patient is stable for discharge.     Plan: Home Medications- usual, Kaopect[employer], when necessary; Home Treatments- rest, increase fluids; Recommended follow up- PCP, when necessary         Flint Melter, MD 04/30/12 2300

## 2012-04-30 NOTE — ED Notes (Signed)
ZOX:[id number]<AV> Expected date:04/30/12<BR> Expected time: 6:21 PM<BR> Means of arrival:Ambulance<BR> Comments:<BR> Diarrhea x 3 days

## 2012-05-09 ENCOUNTER — Encounter (HOSPITAL_COMMUNITY): Payer: Self-pay | Admitting: *Deleted

## 2012-05-09 ENCOUNTER — Emergency Department (HOSPITAL_COMMUNITY)
Admission: EM | Admit: 2012-05-09 | Discharge: 2012-05-09 | Disposition: A | Discharge status: Home / Self Care | Payer: Medicare Other | Attending: Emergency Medicine | Admitting: Emergency Medicine

## 2012-05-09 DIAGNOSIS — Z8679 Personal history of other diseases of the circulatory system: Secondary | ICD-10-CM | POA: Insufficient documentation

## 2012-05-09 DIAGNOSIS — R143 Flatulence: Secondary | ICD-10-CM | POA: Insufficient documentation

## 2012-05-09 DIAGNOSIS — E871 Hypo-osmolality and hyponatremia: Secondary | ICD-10-CM | POA: Insufficient documentation

## 2012-05-09 DIAGNOSIS — N138 Other obstructive and reflux uropathy: Secondary | ICD-10-CM | POA: Insufficient documentation

## 2012-05-09 DIAGNOSIS — R141 Gas pain: Secondary | ICD-10-CM | POA: Insufficient documentation

## 2012-05-09 DIAGNOSIS — E039 Hypothyroidism, unspecified: Secondary | ICD-10-CM | POA: Insufficient documentation

## 2012-05-09 DIAGNOSIS — R109 Unspecified abdominal pain: Secondary | ICD-10-CM | POA: Insufficient documentation

## 2012-05-09 DIAGNOSIS — N401 Enlarged prostate with lower urinary tract symptoms: Secondary | ICD-10-CM | POA: Insufficient documentation

## 2012-05-09 DIAGNOSIS — R142 Eructation: Secondary | ICD-10-CM | POA: Insufficient documentation

## 2012-05-09 DIAGNOSIS — I10 Essential (primary) hypertension: Secondary | ICD-10-CM | POA: Insufficient documentation

## 2012-05-09 DIAGNOSIS — Z8711 Personal history of peptic ulcer disease: Secondary | ICD-10-CM | POA: Insufficient documentation

## 2012-05-09 DIAGNOSIS — I4891 Unspecified atrial fibrillation: Secondary | ICD-10-CM | POA: Insufficient documentation

## 2012-05-09 DIAGNOSIS — I2510 Atherosclerotic heart disease of native coronary artery without angina pectoris: Secondary | ICD-10-CM | POA: Insufficient documentation

## 2012-05-09 DIAGNOSIS — I5022 Chronic systolic (congestive) heart failure: Secondary | ICD-10-CM | POA: Insufficient documentation

## 2012-05-09 DIAGNOSIS — J4489 Other specified chronic obstructive pulmonary disease: Secondary | ICD-10-CM | POA: Insufficient documentation

## 2012-05-09 DIAGNOSIS — K219 Gastro-esophageal reflux disease without esophagitis: Secondary | ICD-10-CM | POA: Insufficient documentation

## 2012-05-09 DIAGNOSIS — J449 Chronic obstructive pulmonary disease, unspecified: Secondary | ICD-10-CM | POA: Insufficient documentation

## 2012-05-09 DIAGNOSIS — Z8739 Personal history of other diseases of the musculoskeletal system and connective tissue: Secondary | ICD-10-CM | POA: Insufficient documentation

## 2012-05-09 DIAGNOSIS — Z79899 Other long term (current) drug therapy: Secondary | ICD-10-CM | POA: Insufficient documentation

## 2012-05-09 DIAGNOSIS — R339 Retention of urine, unspecified: Secondary | ICD-10-CM | POA: Insufficient documentation

## 2012-05-09 DIAGNOSIS — R197 Diarrhea, unspecified: Secondary | ICD-10-CM | POA: Insufficient documentation

## 2012-05-09 DIAGNOSIS — Z8673 Personal history of transient ischemic attack (TIA), and cerebral infarction without residual deficits: Secondary | ICD-10-CM | POA: Insufficient documentation

## 2012-05-09 DIAGNOSIS — Z87448 Personal history of other diseases of urinary system: Secondary | ICD-10-CM | POA: Insufficient documentation

## 2012-05-09 DIAGNOSIS — Z87891 Personal history of nicotine dependence: Secondary | ICD-10-CM | POA: Insufficient documentation

## 2012-05-09 LAB — URINALYSIS, ROUTINE W REFLEX MICROSCOPIC
Glucose, UA: NEGATIVE mg/dL
Ketones, ur: NEGATIVE mg/dL
Leukocytes, UA: NEGATIVE
Nitrite: NEGATIVE
Protein, ur: NEGATIVE mg/dL
Urobilinogen, UA: 1 mg/dL (ref 0.0–1.0)

## 2012-05-09 LAB — POCT I-STAT, CHEM 8
Calcium, Ion: 1.2 mmol/L (ref 1.13–1.30)
HCT: 38 % — ABNORMAL LOW (ref 39.0–52.0)
Hemoglobin: 12.9 g/dL — ABNORMAL LOW (ref 13.0–17.0)
Sodium: 128 mEq/L — ABNORMAL LOW (ref 135–145)
TCO2: 32 mmol/L (ref 0–100)

## 2012-05-09 NOTE — ED Provider Notes (Signed)
History     CSN: [id number]  Arrival date & time 05/09/12  1552   First MD Initiated Contact with Patient 05/09/12 1603      Chief Complaint  Patient presents with  . Urinary Retention    (Consider location/radiation/quality/duration/timing/severity/associated sxs/prior treatment) The history is provided by the patient.  patient presents with urinary retention. Last urinated yesterday. He has had some diarrhea. His abdomen is swollen and painfull. He has previously had prostate problems and has previously had a catheter. Patient states he previously had diarrhea and it has resolved.  Past Medical History  Diagnosis Date  . Abnormality of gait 09/28/2008  . Acquired deformity of chest and rib 01/14/2009  . Atrial fibrillation 09/30/2006  . BENIGN PROSTATIC HYPERTROPHY, WITH URINARY OBSTRUCTION 09/04/2009  . CHRONIC OBSTRUCTIVE PULMONARY DISEASE, ACUTE EXACERBATION 03/24/2007  . Chronic systolic heart failure 08/06/2008  . COPD 09/30/2006  . CORONARY ARTERY DISEASE 10/01/2006  . GERD 04/19/2007  . HYPERTENSION 09/30/2006  . HYPOTHYROIDISM 09/30/2006  . LASSITUDE 06/13/2007  . Osteoarth NOS-Unspec 09/30/2006  . PEPTIC ULCER DISEASE 09/30/2006  . PERIPHERAL VASCULAR DISEASE 09/30/2006  . SPINAL STENOSIS 08/21/2009  . STROKE 08/28/2008    Past Surgical History  Procedure Date  . Appendectomy   . Hernia repair   . Tonsillectomy   . Abdominal aortic aneurysm repair   . Transurethral resection of prostate   . Lumbar laminectomy   . Laryngoscopy and esophagoscopy 02/10/2012    Procedure: LARYNGOSCOPY AND ESOPHAGOSCOPY;  Surgeon: Flo Shanks, MD;  Location: [facility];  Service: ENT;  Laterality: N/A;  DIRECT LARYNGOSCOPY/REMOVAL FOREIGN BODY FROM ESOPHAGUS    History reviewed. No pertinent family history.  History  Substance Use Topics  . Smoking status: Former Smoker -- 0.8 packs/day for 20 years    Types: Cigarettes    Quit date: 04/06/1948  . Smokeless tobacco: Never Used  . Alcohol  Use: No      Review of Systems  Constitutional: Negative for activity change and appetite change.  HENT: Negative for neck stiffness.   Eyes: Negative for pain.  Respiratory: Negative for chest tightness and shortness of breath.   Cardiovascular: Negative for chest pain and leg swelling.  Gastrointestinal: Positive for abdominal pain, diarrhea and abdominal distention. Negative for nausea and vomiting.  Genitourinary: Negative for flank pain, scrotal swelling and testicular pain.  Musculoskeletal: Negative for back pain.  Skin: Negative for rash.  Neurological: Negative for weakness, numbness and headaches.  Psychiatric/Behavioral: Negative for behavioral problems.    Allergies  Bleph-10 and Procaine hcl  Home Medications   Current Outpatient Rx  Name  Route  Sig  Dispense  Refill  . BUDESONIDE-FORMOTEROL FUMARATE 160-4.5 MCG/ACT IN AERO   Inhalation   Inhale 2 puffs into the lungs 2 (two) times daily.   1 Inhaler   12   . CARVEDILOL 6.25 MG PO TABS   Oral   Take 3.125 mg by mouth 2 (two) times daily with a meal. One half tablet twice daily         . CELECOXIB 200 MG PO CAPS   Oral   Take 200 mg by mouth daily as needed. Left arm pain.         Marland Kitchen CLOPIDOGREL BISULFATE 75 MG PO TABS   Oral   Take 75 mg by mouth daily.         Marland Kitchen DIGOXIN 0.125 MG PO TABS   Oral   Take 125 mcg by mouth daily.         Marland Kitchen  DUTASTERIDE 0.5 MG PO CAPS   Oral   Take 1 capsule (0.5 mg total) by mouth daily.   50 capsule   3   . FLUTICASONE PROPIONATE 50 MCG/ACT NA SUSP   Nasal   Place 2 sprays into the nose daily.   1 g   0   . FUROSEMIDE 20 MG PO TABS   Oral   Take 1 tablet (20 mg total) by mouth daily.   90 tablet   3   . IPRATROPIUM BROMIDE 0.02 % IN SOLN   Nebulization   Take 250 mcg by nebulization 2 (two) times daily.         . IPRATROPIUM BROMIDE 0.02 % IN SOLN      USE VIAL NEBULIZER TWICE A DAY   62.5 mL   5     DX Code Needed .   Marland Kitchen LEVOTHYROXINE  SODIUM 50 MCG PO TABS   Oral   Take 50 mcg by mouth daily.         Marland Kitchen LORAZEPAM 0.5 MG PO TABS   Oral   Take 0.5 mg by mouth every 8 (eight) hours as needed. Anxiety         . PANTOPRAZOLE SODIUM 40 MG PO TBEC   Oral   Take 40 mg by mouth daily.         Marland Kitchen POTASSIUM CHLORIDE CRYS ER 10 MEQ PO TBCR   Oral   Take 10 mEq by mouth daily.         Marland Kitchen SPIRONOLACTONE 25 MG PO TABS   Oral   Take 25 mg by mouth 2 (two) times daily.         Marland Kitchen TAMSULOSIN HCL 0.4 MG PO CAPS   Oral   Take 0.4 mg by mouth daily.         . TRAMADOL HCL 50 MG PO TABS   Oral   Take 50 mg by mouth every 6 (six) hours as needed. For pain           BP 130/89  Pulse 78  Temp 97.3 F (36.3 C) (Oral)  Resp 20  SpO2 93%  Physical Exam  Nursing note and vitals reviewed. Constitutional: He is oriented to person, place, and time. He appears well-developed and well-nourished.  HENT:  Head: Normocephalic and atraumatic.  Eyes: EOM are normal. Pupils are equal, round, and reactive to light.  Neck: Normal range of motion. Neck supple.  Cardiovascular: Normal rate, regular rhythm and normal heart sounds.   No murmur heard. Pulmonary/Chest: Effort normal and breath sounds normal.  Abdominal: Soft. Bowel sounds are normal. He exhibits mass. He exhibits no distension. There is no tenderness. There is no rebound and no guarding.       Suprapubic mass.   Musculoskeletal: Normal range of motion. He exhibits no edema.  Neurological: He is alert and oriented to person, place, and time. No cranial nerve deficit.  Skin: Skin is warm and dry.  Psychiatric: He has a normal mood and affect.    ED Course  Procedures (including critical care time)  Labs Reviewed  URINALYSIS, ROUTINE W REFLEX MICROSCOPIC - Abnormal; Notable for the following:    APPearance CLOUDY (*)     All other components within normal limits  POCT I-STAT, CHEM 8 - Abnormal; Notable for the following:    Sodium 128 (*)     Chloride 90  (*)     Glucose, Bld 111 (*)     Hemoglobin 12.9 (*)  HCT 38.0 (*)     All other components within normal limits   No results found.   1. Urinary retention   2. Hyponatremia       MDM  Patient with urinary retention. Foley placed with good flow of urine. Lab work done since had reportedly had some diarrhea and last urinated yesterday. Renal function is good. Sodium is mildly low at 128. This appears to be near his baseline. He will be discharged home to followup with his doctors.        Juliet Rude. Rubin Payor, MD 05/09/12 1759

## 2012-05-09 NOTE — ED Notes (Signed)
[facility] called and taking pt back home.

## 2012-05-09 NOTE — ED Notes (Signed)
Per EMS pt from home w/ wife, pt a/o x 3, unable to urinate x 2 days, lower abdomen distended/bladder area, has had diarrhea, BP 110, HR 70 afib, RR 16

## 2012-05-09 NOTE — ED Notes (Signed)
Pt given leg bag to take home and given/explained discharge instructions.

## 2012-05-09 NOTE — ED Notes (Signed)
[id number]<XB> Expected date:<BR> Expected time:<BR> Means of arrival:<BR> Comments:<BR> Ems/ not able to urinate for 2 days

## 2012-06-06 ENCOUNTER — Telehealth: Payer: Self-pay | Admitting: Internal Medicine

## 2012-06-06 NOTE — Telephone Encounter (Signed)
Patient is having hard time getting some meds, because he does not have a Part D on his new insurance. Benita, caregiver, is requesting our office call the [facility] in [place], [place] at [phone] to refill the clopidogrel (PLAVIX) 75 MG tablet, celecoxib (CELEBREX) 200 MG capsule, potassium chloride (K-DUR,KLOR-CON) 10 MEQ tablet, & traMADol (ULTRAM) 50 MG tablet. They will mail the meds to him. Please advise.

## 2012-06-08 NOTE — Telephone Encounter (Signed)
Left message on voicemail [facility] to contact [facility] and was unable to through to anyone.

## 2012-06-14 NOTE — Telephone Encounter (Signed)
Left message on voicemail.

## 2012-06-14 NOTE — Telephone Encounter (Signed)
Mita called back pt's caregiver left me message on my machine. Tried to contact Mita left message to call me again.

## 2012-06-15 MED ORDER — CELECOXIB 200 MG PO CAPS: 200.0000 mg | ORAL_CAPSULE | Freq: Every day | ORAL | Status: DC | PRN

## 2012-06-15 MED ORDER — POTASSIUM CHLORIDE CRYS ER 10 MEQ PO TBCR: 10.0000 meq | EXTENDED_RELEASE_TABLET | Freq: Every day | ORAL | Status: DC

## 2012-06-15 MED ORDER — CLOPIDOGREL BISULFATE 75 MG PO TABS: 75.0000 mg | ORAL_TABLET | Freq: Every day | ORAL | Status: DC

## 2012-06-15 MED ORDER — TRAMADOL HCL 50 MG PO TABS: 50.0000 mg | ORAL_TABLET | Freq: Four times a day (QID) | ORAL | Status: DC | PRN

## 2012-06-15 NOTE — Telephone Encounter (Signed)
Left message on voicemail for Mita.

## 2012-06-15 NOTE — Telephone Encounter (Signed)
[name] called back told her I have not been able to talk to anyone at the [facility] it keeps disconnecting me. Told her I can print the Rx's out and she can come by and pick them up and take to the [facility] themselves. [name] verbalized understanding and stated that would be fine. Told her okay will put at front desk for her. [name] verbalized understanding. Rx's printed and signed, put at front desk for pick up.

## 2012-06-21 ENCOUNTER — Encounter: Payer: Self-pay | Admitting: Internal Medicine

## 2012-06-21 ENCOUNTER — Ambulatory Visit (INDEPENDENT_AMBULATORY_CARE_PROVIDER_SITE_OTHER): Payer: Medicare Other | Admitting: Internal Medicine

## 2012-06-21 VITALS — BP 100/60 | HR 68 | Temp 97.7°F | Resp 28 | Wt 119.0 lb

## 2012-06-21 DIAGNOSIS — J449 Chronic obstructive pulmonary disease, unspecified: Secondary | ICD-10-CM

## 2012-06-21 DIAGNOSIS — I4891 Unspecified atrial fibrillation: Secondary | ICD-10-CM

## 2012-06-21 DIAGNOSIS — I10 Essential (primary) hypertension: Secondary | ICD-10-CM

## 2012-06-21 DIAGNOSIS — R0609 Other forms of dyspnea: Secondary | ICD-10-CM

## 2012-06-21 DIAGNOSIS — I5022 Chronic systolic (congestive) heart failure: Secondary | ICD-10-CM

## 2012-06-21 DIAGNOSIS — R5381 Other malaise: Secondary | ICD-10-CM

## 2012-06-21 DIAGNOSIS — R531 Weakness: Secondary | ICD-10-CM

## 2012-06-21 DIAGNOSIS — R0600 Dyspnea, unspecified: Secondary | ICD-10-CM

## 2012-06-21 DIAGNOSIS — J441 Chronic obstructive pulmonary disease with (acute) exacerbation: Secondary | ICD-10-CM

## 2012-06-21 DIAGNOSIS — J4489 Other specified chronic obstructive pulmonary disease: Secondary | ICD-10-CM

## 2012-06-21 DIAGNOSIS — E871 Hypo-osmolality and hyponatremia: Secondary | ICD-10-CM

## 2012-06-21 MED ORDER — AZITHROMYCIN 250 MG PO TABS: ORAL_TABLET | ORAL | Status: DC

## 2012-06-21 NOTE — Patient Instructions (Signed)
Limit your sodium (Salt) intake  Hold furosemide for one week Hold digoxin  for one week  Call or return to clinic prn if these symptoms worsen or fail to improve as anticipated.

## 2012-06-21 NOTE — Progress Notes (Signed)
Subjective:    Patient ID: [name], male    DOB: [birth year], 79 y.o.   MRN: [id number]  HPI  79 year old patient who has advanced coronary artery disease with ischemic cardiomyopathy and chronic systolic heart failure. He also has advanced COPD and is oxygen dependent. Complaints today include fatigue and shortness of breath. He is on maximal home inhalational therapy with nebulizer treatments as well as Symbicort. Denies any chest pain. There's been no peripheral edema. No fever or sputum production.  Past Medical History  Diagnosis Date  . Abnormality of gait 09/28/2008  . Acquired deformity of chest and rib 01/14/2009  . Atrial fibrillation 09/30/2006  . BENIGN PROSTATIC HYPERTROPHY, WITH URINARY OBSTRUCTION 09/04/2009  . CHRONIC OBSTRUCTIVE PULMONARY DISEASE, ACUTE EXACERBATION 03/24/2007  . Chronic systolic heart failure 08/06/2008  . COPD 09/30/2006  . CORONARY ARTERY DISEASE 10/01/2006  . GERD 04/19/2007  . HYPERTENSION 09/30/2006  . HYPOTHYROIDISM 09/30/2006  . LASSITUDE 06/13/2007  . Osteoarth NOS-Unspec 09/30/2006  . PEPTIC ULCER DISEASE 09/30/2006  . PERIPHERAL VASCULAR DISEASE 09/30/2006  . SPINAL STENOSIS 08/21/2009  . STROKE 08/28/2008    History   Social History  . Marital Status: Married    Spouse Name: N/A    Number of Children: N/A  . Years of Education: N/A   Occupational History  . 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 Smoking status: Former Smoker -- 0.80 packs/day for 20 years    Types: Cigarettes    Quit date: 04/06/1948  . Smokeless tobacco: Never Used  . Alcohol Use: No  . Drug Use: No  . Sexually Active: Not on file   Other Topics Concern  . Not on file   Social History Narrative   Lives at home with wife and has caretaker, still ambulatory daily with a walker. Had 2 children, 1 passed away.      Past Surgical History  Procedure Laterality Date  . Appendectomy    . Hernia repair    .  Tonsillectomy    . Abdominal aortic aneurysm repair    . Transurethral resection of prostate    . Lumbar laminectomy    . Laryngoscopy and esophagoscopy  02/10/2012    Procedure: LARYNGOSCOPY AND ESOPHAGOSCOPY;  Surgeon: Flo Shanks, MD;  Location: [facility];  Service: ENT;  Laterality: N/A;  DIRECT LARYNGOSCOPY/REMOVAL FOREIGN BODY FROM ESOPHAGUS    No family history on file.  Allergies  Allergen Reactions  . Bleph-10 (Sulfacetamide Sodium) Other (See Comments)    Unknown   . Procaine Hcl     REACTION: syncope    Current Outpatient Prescriptions on File Prior to Visit  Medication Sig Dispense Refill  . budesonide-formoterol (SYMBICORT) 160-4.5 MCG/ACT inhaler Inhale 2 puffs into the lungs 2 (two) times daily.  1 Inhaler  12  . carvedilol (COREG) 6.25 MG tablet Take 3.125 mg by mouth 2 (two) times daily with a meal. One half tablet twice daily      . celecoxib (CELEBREX) 200 MG capsule Take 1 capsule (200 mg total) by mouth daily as needed. Left arm pain.  90 capsule  3  . clopidogrel (PLAVIX) 75 MG tablet Take 1 tablet (75 mg total) by mouth daily.  90 tablet  3  . digoxin (LANOXIN) 0.125 MG tablet Take 125 mcg by mouth daily.      Marland Kitchen dutasteride (AVODART) 0.5 MG capsule Take 1 capsule (0.5 mg total) by mouth daily.  50 capsule  3  . fluticasone (FLONASE) 50 MCG/ACT nasal spray Place 2 sprays into the nose daily.  1 g  0  . furosemide (LASIX) 20 MG tablet Take 1 tablet (20 mg total) by mouth daily.  90 tablet  3  . ipratropium (ATROVENT) 0.02 % nebulizer solution Take 250 mcg by nebulization 2 (two) times daily.      Marland Kitchen ipratropium (ATROVENT) 0.02 % nebulizer solution USE VIAL NEBULIZER TWICE A DAY  62.5 mL  5  . levothyroxine (SYNTHROID, LEVOTHROID) 50 MCG tablet Take 50 mcg by mouth daily.      Marland Kitchen LORazepam (ATIVAN) 0.5 MG tablet Take 0.5 mg by mouth every 8 (eight) hours as needed. Anxiety      . pantoprazole (PROTONIX) 40 MG tablet Take 40 mg by mouth daily.      . potassium chloride  (K-DUR,KLOR-CON) 10 MEQ tablet Take 1 tablet (10 mEq total) by mouth daily.  90 tablet  3  . spironolactone (ALDACTONE) 25 MG tablet Take 25 mg by mouth 2 (two) times daily.      . Tamsulosin HCl (FLOMAX) 0.4 MG CAPS Take 0.4 mg by mouth daily.      . traMADol (ULTRAM) 50 MG tablet Take 1 tablet (50 mg total) by mouth every 6 (six) hours as needed. For pain  90 tablet  1   No current facility-administered medications on file prior to visit.    BP 100/60  Pulse 68  Temp(Src) 97.7 F (36.5 C) (Oral)  Resp 28  Wt 119 lb (53.978 kg)  BMI 16.6 kg/m2  SpO2 79%      Review of Systems  Constitutional: Positive for fatigue. Negative for fever, chills and appetite change.  HENT: Negative for hearing loss, ear pain, congestion, sore throat, trouble swallowing, neck stiffness, dental problem, voice change and tinnitus.   Eyes: Negative for pain, discharge and visual disturbance.  Respiratory: Positive for shortness of breath. Negative for cough, chest tightness, wheezing and stridor.   Cardiovascular: Negative for chest pain, palpitations and leg swelling.  Gastrointestinal: Negative for nausea, vomiting, abdominal pain, diarrhea, constipation, blood in stool and abdominal distention.  Genitourinary: Negative for urgency, hematuria, flank pain, discharge, difficulty urinating and genital sores.  Musculoskeletal: Negative for myalgias, back pain, joint swelling, arthralgias and gait problem.  Skin: Negative for rash.  Neurological: Positive for weakness. Negative for dizziness, syncope, speech difficulty, numbness and headaches.  Hematological: Negative for adenopathy. Does not bruise/bleed easily.  Psychiatric/Behavioral: Negative for behavioral problems and dysphoric mood. The patient is not nervous/anxious.        Objective:   Physical Exam  Constitutional: He is oriented to person, place, and time. He appears well-developed.  Alert frail appears chronically ill but in no acute  distress. Afebrile. No tachycardia. The resting tachypnea. Blood pressure 100/60.  HENT:  Head: Normocephalic.  Right Ear: External ear normal.  Left Ear: External ear normal.  Eyes: Conjunctivae and EOM are normal.  Neck: Normal range of motion.  Cardiovascular: Normal rate and normal heart sounds.   Pulmonary/Chest: Effort normal.  Diminished breath sounds but clear. O2 saturation 90%  Abdominal: Bowel sounds are normal.  Musculoskeletal: Normal range of motion. He exhibits no edema and no tenderness.  Neurological: He is alert and oriented to person, place, and time.  Psychiatric: He has a normal mood and affect. His behavior is normal.          Assessment & Plan:    COPD H/o hyponatremia CAF CHF  Will check Bmet. Hold  lasix Recheck 7 days or prn

## 2012-07-04 ENCOUNTER — Telehealth: Payer: Self-pay | Admitting: Internal Medicine

## 2012-07-04 NOTE — Telephone Encounter (Signed)
Opened in error/kh 

## 2012-07-05 ENCOUNTER — Other Ambulatory Visit: Payer: Self-pay | Admitting: Internal Medicine

## 2012-07-13 ENCOUNTER — Ambulatory Visit (INDEPENDENT_AMBULATORY_CARE_PROVIDER_SITE_OTHER): Payer: Medicare Other | Admitting: Internal Medicine

## 2012-07-13 ENCOUNTER — Encounter: Payer: Self-pay | Admitting: Internal Medicine

## 2012-07-13 VITALS — BP 94/60 | HR 60 | Temp 97.7°F | Resp 36 | Wt 122.0 lb

## 2012-07-13 DIAGNOSIS — R531 Weakness: Secondary | ICD-10-CM

## 2012-07-13 DIAGNOSIS — I5022 Chronic systolic (congestive) heart failure: Secondary | ICD-10-CM

## 2012-07-13 DIAGNOSIS — J449 Chronic obstructive pulmonary disease, unspecified: Secondary | ICD-10-CM

## 2012-07-13 DIAGNOSIS — R5383 Other fatigue: Secondary | ICD-10-CM

## 2012-07-13 NOTE — Progress Notes (Signed)
Subjective:    Patient ID: [name], male    DOB: 1917/06/21, 79 y.o.   MRN: [id number]  HPI  79 year old patient who has a history of advanced COPD requiring chronic oxygen therapy as well as chronic systolic heart failure. For the past 3 or 4 days she has had slight increased cough and shortness of breath. Cough has been nonproductive. There is been no fever. No peripheral edema  Past Medical History  Diagnosis Date  . Abnormality of gait 09/28/2008  . Acquired deformity of chest and rib 01/14/2009  . Atrial fibrillation 09/30/2006  . BENIGN PROSTATIC HYPERTROPHY, WITH URINARY OBSTRUCTION 09/04/2009  . CHRONIC OBSTRUCTIVE PULMONARY DISEASE, ACUTE EXACERBATION 03/24/2007  . Chronic systolic heart failure 08/06/2008  . COPD 09/30/2006  . CORONARY ARTERY DISEASE 10/01/2006  . GERD 04/19/2007  . HYPERTENSION 09/30/2006  . HYPOTHYROIDISM 09/30/2006  . LASSITUDE 06/13/2007  . Osteoarth NOS-Unspec 09/30/2006  . PEPTIC ULCER DISEASE 09/30/2006  . PERIPHERAL VASCULAR DISEASE 09/30/2006  . SPINAL STENOSIS 08/21/2009  . STROKE 08/28/2008    History   Social History  . Marital Status: Married    Spouse Name: N/A    Number of Children: N/A  . Years of Education: N/A   Occupational History  . 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 Smoking status: Former Smoker -- 0.80 packs/day for 20 years    Types: Cigarettes    Quit date: 04/06/1948  . Smokeless tobacco: Never Used  . Alcohol Use: No  . Drug Use: No  . Sexually Active: Not on file   Other Topics Concern  . Not on file   Social History Narrative   Lives at home with wife and has caretaker, still ambulatory daily with a walker. Had 2 children, 1 passed away.      Past Surgical History  Procedure Laterality Date  . Appendectomy    . Hernia repair    . Tonsillectomy    . Abdominal aortic aneurysm repair    . Transurethral resection of prostate    . Lumbar laminectomy    .  Laryngoscopy and esophagoscopy  02/10/2012    Procedure: LARYNGOSCOPY AND ESOPHAGOSCOPY;  Surgeon: Flo Shanks, MD;  Location: [facility];  Service: ENT;  Laterality: N/A;  DIRECT LARYNGOSCOPY/REMOVAL FOREIGN BODY FROM ESOPHAGUS    No family history on file.  Allergies  Allergen Reactions  . Bleph-10 (Sulfacetamide Sodium) Other (See Comments)    Unknown   . Procaine Hcl     REACTION: syncope    Current Outpatient Prescriptions on File Prior to Visit  Medication Sig Dispense Refill  . carvedilol (COREG) 6.25 MG tablet Take 3.125 mg by mouth 2 (two) times daily with a meal. One half tablet twice daily      . celecoxib (CELEBREX) 200 MG capsule Take 1 capsule (200 mg total) by mouth daily as needed. Left arm pain.  90 capsule  3  . clopidogrel (PLAVIX) 75 MG tablet Take 1 tablet (75 mg total) by mouth daily.  90 tablet  3  . digoxin (LANOXIN) 0.125 MG tablet Take 125 mcg by mouth daily.      Marland Kitchen dutasteride (AVODART) 0.5 MG capsule Take 1 capsule (0.5 mg total) by mouth daily.  50 capsule  3  . furosemide (LASIX) 20 MG tablet Take 1 tablet (20 mg total) by mouth daily.  90 tablet  3  . ipratropium (ATROVENT) 0.02 % nebulizer solution USE VIAL NEBULIZER TWICE  A DAY  62.5 mL  5  . KLOR-CON 10 10 MEQ tablet TAKE 1 TABLET BY MOUTH EVERY DAY  90 tablet  1  . levothyroxine (SYNTHROID, LEVOTHROID) 50 MCG tablet Take 50 mcg by mouth daily.      Marland Kitchen LORazepam (ATIVAN) 0.5 MG tablet Take 0.5 mg by mouth every 8 (eight) hours as needed. Anxiety      . pantoprazole (PROTONIX) 40 MG tablet Take 40 mg by mouth daily.      . potassium chloride (K-DUR,KLOR-CON) 10 MEQ tablet Take 1 tablet (10 mEq total) by mouth daily.  90 tablet  3  . spironolactone (ALDACTONE) 25 MG tablet Take 25 mg by mouth 2 (two) times daily.      . Tamsulosin HCl (FLOMAX) 0.4 MG CAPS Take 0.4 mg by mouth daily.      . traMADol (ULTRAM) 50 MG tablet Take 1 tablet (50 mg total) by mouth every 6 (six) hours as needed. For pain  90 tablet  1    No current facility-administered medications on file prior to visit.    BP 94/60  Pulse 60  Temp(Src) 97.7 F (36.5 C) (Oral)  Resp 36  Wt 122 lb (55.339 kg)  BMI 17.02 kg/m2  SpO2 94%       Review of Systems  Constitutional: Positive for fatigue. Negative for fever, chills and appetite change.  HENT: Negative for hearing loss, ear pain, congestion, sore throat, trouble swallowing, neck stiffness, dental problem, voice change and tinnitus.   Eyes: Negative for pain, discharge and visual disturbance.  Respiratory: Positive for cough and shortness of breath. Negative for chest tightness, wheezing and stridor.   Cardiovascular: Negative for chest pain, palpitations and leg swelling.  Gastrointestinal: Negative for nausea, vomiting, abdominal pain, diarrhea, constipation, blood in stool and abdominal distention.  Genitourinary: Negative for urgency, hematuria, flank pain, discharge, difficulty urinating and genital sores.  Musculoskeletal: Negative for myalgias, back pain, joint swelling, arthralgias and gait problem.  Skin: Negative for rash.  Neurological: Positive for weakness. Negative for dizziness, syncope, speech difficulty, numbness and headaches.  Hematological: Negative for adenopathy. Does not bruise/bleed easily.  Psychiatric/Behavioral: Negative for behavioral problems and dysphoric mood. The patient is not nervous/anxious.        Objective:   Physical Exam  Constitutional: He is oriented to person, place, and time. He appears well-developed.  Frail alert in no acute distress. Nasal oxygen in place  HENT:  Head: Normocephalic.  Right Ear: External ear normal.  Left Ear: External ear normal.  Eyes: Conjunctivae and EOM are normal.  Neck: Normal range of motion.  Cardiovascular: Normal rate and normal heart sounds.   Irregular with a controlled ventricular response  Pulmonary/Chest: Effort normal.  Diminished breath sounds but clear  Abdominal: Bowel sounds  are normal.  Musculoskeletal: Normal range of motion. He exhibits no edema and no tenderness.  Neurological: He is alert and oriented to person, place, and time.  Psychiatric: He has a normal mood and affect. His behavior is normal.          Assessment & Plan:   Advanced COPD Chronic systolic heart failure. Seems euvolemic  We'll continue Robitussin and observe at this point ; there is no bronchospasm. Doubt will benefit from antibiotics or steroids at this time but will consider if any clinical worsening

## 2012-07-13 NOTE — Patient Instructions (Signed)
Limit your sodium (Salt) intake  Call or return to clinic prn if these symptoms worsen or fail to improve as anticipated.   

## 2012-07-18 ENCOUNTER — Telehealth: Payer: Self-pay | Admitting: Internal Medicine

## 2012-07-18 ENCOUNTER — Emergency Department (HOSPITAL_COMMUNITY)
Admission: EM | Admit: 2012-07-18 | Discharge: 2012-07-18 | Disposition: A | Discharge status: Home / Self Care | Payer: Medicare Other | Attending: Emergency Medicine | Admitting: Emergency Medicine

## 2012-07-18 ENCOUNTER — Encounter (HOSPITAL_COMMUNITY): Payer: Self-pay | Admitting: Emergency Medicine

## 2012-07-18 ENCOUNTER — Ambulatory Visit: Payer: Self-pay | Admitting: Internal Medicine

## 2012-07-18 DIAGNOSIS — I2510 Atherosclerotic heart disease of native coronary artery without angina pectoris: Secondary | ICD-10-CM | POA: Insufficient documentation

## 2012-07-18 DIAGNOSIS — Z79899 Other long term (current) drug therapy: Secondary | ICD-10-CM | POA: Insufficient documentation

## 2012-07-18 DIAGNOSIS — M79605 Pain in left leg: Secondary | ICD-10-CM

## 2012-07-18 DIAGNOSIS — J449 Chronic obstructive pulmonary disease, unspecified: Secondary | ICD-10-CM | POA: Insufficient documentation

## 2012-07-18 DIAGNOSIS — N401 Enlarged prostate with lower urinary tract symptoms: Secondary | ICD-10-CM | POA: Insufficient documentation

## 2012-07-18 DIAGNOSIS — Z87891 Personal history of nicotine dependence: Secondary | ICD-10-CM | POA: Insufficient documentation

## 2012-07-18 DIAGNOSIS — J4489 Other specified chronic obstructive pulmonary disease: Secondary | ICD-10-CM | POA: Insufficient documentation

## 2012-07-18 DIAGNOSIS — Z8739 Personal history of other diseases of the musculoskeletal system and connective tissue: Secondary | ICD-10-CM | POA: Insufficient documentation

## 2012-07-18 DIAGNOSIS — M79609 Pain in unspecified limb: Secondary | ICD-10-CM

## 2012-07-18 DIAGNOSIS — Z8679 Personal history of other diseases of the circulatory system: Secondary | ICD-10-CM | POA: Insufficient documentation

## 2012-07-18 DIAGNOSIS — Z8711 Personal history of peptic ulcer disease: Secondary | ICD-10-CM | POA: Insufficient documentation

## 2012-07-18 DIAGNOSIS — Z8673 Personal history of transient ischemic attack (TIA), and cerebral infarction without residual deficits: Secondary | ICD-10-CM | POA: Insufficient documentation

## 2012-07-18 DIAGNOSIS — I10 Essential (primary) hypertension: Secondary | ICD-10-CM | POA: Insufficient documentation

## 2012-07-18 DIAGNOSIS — E039 Hypothyroidism, unspecified: Secondary | ICD-10-CM | POA: Insufficient documentation

## 2012-07-18 DIAGNOSIS — K219 Gastro-esophageal reflux disease without esophagitis: Secondary | ICD-10-CM | POA: Insufficient documentation

## 2012-07-18 DIAGNOSIS — I5022 Chronic systolic (congestive) heart failure: Secondary | ICD-10-CM | POA: Insufficient documentation

## 2012-07-18 DIAGNOSIS — Z7902 Long term (current) use of antithrombotics/antiplatelets: Secondary | ICD-10-CM | POA: Insufficient documentation

## 2012-07-18 DIAGNOSIS — N138 Other obstructive and reflux uropathy: Secondary | ICD-10-CM | POA: Insufficient documentation

## 2012-07-18 MED ORDER — TRAMADOL HCL 50 MG PO TABS: 50.0000 mg | ORAL_TABLET | Freq: Four times a day (QID) | ORAL | Status: AC | PRN

## 2012-07-18 NOTE — ED Provider Notes (Signed)
History     CSN: [id number]  Arrival date & time 07/18/12  0903   First MD Initiated Contact with Patient 07/18/12 [phone]      Chief Complaint  Patient presents with  . Leg Pain    (Consider location/radiation/quality/duration/timing/severity/associated sxs/prior treatment) Patient is a 79 y.o. male presenting with leg pain. The history is provided by the patient (the pt complains of left leg pain around his thigh.  pt states he awoke with the pain, but it is almost gone now).  Leg Pain Location:  Leg Leg location:  L leg Pain details:    Quality:  Aching   Radiates to:  Does not radiate   Severity:  Mild   Onset quality:  Sudden   Timing:  Intermittent Associated symptoms: no back pain and no fatigue     Past Medical History  Diagnosis Date  . Abnormality of gait 09/28/2008  . Acquired deformity of chest and rib 01/14/2009  . Atrial fibrillation 09/30/2006  . BENIGN PROSTATIC HYPERTROPHY, WITH URINARY OBSTRUCTION 09/04/2009  . CHRONIC OBSTRUCTIVE PULMONARY DISEASE, ACUTE EXACERBATION 03/24/2007  . Chronic systolic heart failure 08/06/2008  . COPD 09/30/2006  . CORONARY ARTERY DISEASE 10/01/2006  . GERD 04/19/2007  . HYPERTENSION 09/30/2006  . HYPOTHYROIDISM 09/30/2006  . LASSITUDE 06/13/2007  . Osteoarth NOS-Unspec 09/30/2006  . PEPTIC ULCER DISEASE 09/30/2006  . PERIPHERAL VASCULAR DISEASE 09/30/2006  . SPINAL STENOSIS 08/21/2009  . STROKE 08/28/2008    Past Surgical History  Procedure Laterality Date  . Appendectomy    . Hernia repair    . Tonsillectomy    . Abdominal aortic aneurysm repair    . Transurethral resection of prostate    . Lumbar laminectomy    . Laryngoscopy and esophagoscopy  02/10/2012    Procedure: LARYNGOSCOPY AND ESOPHAGOSCOPY;  Surgeon: Flo Shanks, MD;  Location: [facility];  Service: ENT;  Laterality: N/A;  DIRECT LARYNGOSCOPY/REMOVAL FOREIGN BODY FROM ESOPHAGUS    History reviewed. No pertinent family history.  History  Substance Use Topics  .  Smoking status: Former Smoker -- 0.80 packs/day for 20 years    Types: Cigarettes    Quit date: 04/06/1948  . Smokeless tobacco: Never Used  . Alcohol Use: No      Review of Systems  Constitutional: Negative for fatigue.  HENT: Negative for congestion, sinus pressure and ear discharge.   Eyes: Negative for discharge.  Respiratory: Negative for cough.   Cardiovascular: Negative for chest pain.  Gastrointestinal: Negative for abdominal pain and diarrhea.  Genitourinary: Negative for frequency and hematuria.  Musculoskeletal: Negative for back pain.       Pain left lower leg in thigh  Skin: Negative for rash.  Neurological: Negative for seizures and headaches.  Psychiatric/Behavioral: Negative for hallucinations.    Allergies  Bleph-10 and Procaine hcl  Home Medications   Current Outpatient Rx  Name  Route  Sig  Dispense  Refill  . budesonide-formoterol (SYMBICORT) 160-4.5 MCG/ACT inhaler   Inhalation   Inhale 2 puffs into the lungs 2 (two) times daily.         . carvedilol (COREG) 6.25 MG tablet   Oral   Take 6.25 mg by mouth 2 (two) times daily with a meal.          . clopidogrel (PLAVIX) 75 MG tablet   Oral   Take 1 tablet (75 mg total) by mouth daily.   90 tablet   3   . dutasteride (AVODART) 0.5 MG capsule   Oral  Take 0.5 mg by mouth every evening.         Marland Kitchen ipratropium (ATROVENT) 0.02 % nebulizer solution   Nebulization   Take 500 mcg by nebulization 4 (four) times daily.         Marland Kitchen levothyroxine (SYNTHROID, LEVOTHROID) 50 MCG tablet   Oral   Take 50 mcg by mouth daily.         Marland Kitchen LORazepam (ATIVAN) 0.5 MG tablet   Oral   Take 0.5 mg by mouth every 8 (eight) hours as needed. Anxiety         . pantoprazole (PROTONIX) 40 MG tablet   Oral   Take 40 mg by mouth daily.         . potassium chloride (K-DUR,KLOR-CON) 10 MEQ tablet   Oral   Take 1 tablet (10 mEq total) by mouth daily.   90 tablet   3   . spironolactone (ALDACTONE) 25 MG  tablet   Oral   Take 25 mg by mouth 2 (two) times daily.         . Tamsulosin HCl (FLOMAX) 0.4 MG CAPS   Oral   Take 0.4 mg by mouth daily.         . traMADol (ULTRAM) 50 MG tablet   Oral   Take 1 tablet (50 mg total) by mouth every 6 (six) hours as needed for pain.   15 tablet   0     BP 131/72  Pulse 76  Temp(Src) 98.4 F (36.9 C) (Oral)  Ht 5' 11.5" (1.816 m)  Wt 115 lb (52.164 kg)  BMI 15.82 kg/m2  SpO2 96%  Physical Exam  Constitutional: He is oriented to person, place, and time. He appears well-developed.  HENT:  Head: Normocephalic.  Eyes: Conjunctivae and EOM are normal. No scleral icterus.  Neck: Neck supple. No thyromegaly present.  Cardiovascular: Normal rate and regular rhythm.  Exam reveals no gallop and no friction rub.   No murmur heard. Pulmonary/Chest: No stridor. He has no wheezes. He has no rales. He exhibits no tenderness.  Abdominal: He exhibits no distension. There is no tenderness. There is no rebound.  Musculoskeletal: Normal range of motion. He exhibits no edema.  Mild tenderness left post.  thigh  Lymphadenopathy:    He has no cervical adenopathy.  Neurological: He is oriented to person, place, and time. Coordination normal.  Skin: No rash noted. No erythema.  Psychiatric: He has a normal mood and affect. His behavior is normal.    ED Course  Procedures (including critical care time)  Labs Reviewed - No data to display No results found.   1. Leg pain, left       MDM          Benny Lennert, MD 07/18/12 1119

## 2012-07-18 NOTE — ED Notes (Signed)
Pt here via ems for c/o bilat leg pain that started in the mid night with some abd  That he stated "not so bad"the patient is on home o2 2 liter

## 2012-07-18 NOTE — Progress Notes (Signed)
VASCULAR LAB PRELIMINARY  PRELIMINARY  PRELIMINARY  PRELIMINARY  Left lower extremity venous duplex completed.    Preliminary report:  Left:  No evidence of DVT, superficial thrombosis, or Baker's cyst.  Shiquita Collignon, RVT 07/18/2012, 10:33 AM

## 2012-07-18 NOTE — Telephone Encounter (Signed)
Caregiver/Benita called to let Dr. Lynett Grimes that the pt was having abdominal pain this am. He was transported by EMS to [facility] and sent back home with nothing being done. Her request was for Dr. Kirtland Bouchard to call the hospital to complain. She already has contacted Catering manager. RN advised she would let MD know but he wouldn't see this until tomorrow since office has closed.

## 2012-07-18 NOTE — ED Notes (Signed)
[id number]> Expected date:<BR> Expected time:<BR> Means of arrival:<BR> Comments:<BR> Ems/ nausea

## 2012-07-19 ENCOUNTER — Ambulatory Visit: Payer: Self-pay | Admitting: Internal Medicine

## 2012-08-01 ENCOUNTER — Other Ambulatory Visit: Payer: Self-pay | Admitting: Internal Medicine

## 2012-08-03 ENCOUNTER — Telehealth: Payer: Self-pay | Admitting: *Deleted

## 2012-08-03 NOTE — Telephone Encounter (Signed)
Received written message from Denita pt's caregiver stating that [facility] will not approve Rx for Plavix they suggested he take Aspirin 81mg  instead wanted to check with Dr. Amador Cunas first due to will cost pt $134.00 for it, pt does not have part D insurance. Dr. Amador Cunas was given message and said pt can take Aspirin 81 mg daily.  Called Denita told her per Dr. Amador Cunas pt can take Aspirin 81 mg one tablet daily. Denita verbalized understanding.

## 2012-09-05 ENCOUNTER — Telehealth: Payer: Self-pay | Admitting: Internal Medicine

## 2012-09-05 NOTE — Telephone Encounter (Signed)
[facility] following up on fax sent 5/20 for CMN (certificate Of Medical Necessity) on oxygen for pt. Pls advise

## 2012-09-06 NOTE — Telephone Encounter (Signed)
Called [facility] asked if they received form back? Was told no. Asked them to fax another form to me. Will fax form.

## 2012-09-12 ENCOUNTER — Emergency Department (HOSPITAL_COMMUNITY)
Admission: EM | Admit: 2012-09-12 | Discharge: 2012-09-12 | Disposition: A | Discharge status: Home / Self Care | Payer: Medicare Other | Attending: Emergency Medicine | Admitting: Emergency Medicine

## 2012-09-12 ENCOUNTER — Encounter (HOSPITAL_COMMUNITY): Payer: Self-pay | Admitting: Emergency Medicine

## 2012-09-12 ENCOUNTER — Telehealth: Payer: Self-pay | Admitting: Internal Medicine

## 2012-09-12 DIAGNOSIS — Z87898 Personal history of other specified conditions: Secondary | ICD-10-CM | POA: Insufficient documentation

## 2012-09-12 DIAGNOSIS — N138 Other obstructive and reflux uropathy: Secondary | ICD-10-CM | POA: Insufficient documentation

## 2012-09-12 DIAGNOSIS — K219 Gastro-esophageal reflux disease without esophagitis: Secondary | ICD-10-CM | POA: Insufficient documentation

## 2012-09-12 DIAGNOSIS — Z8673 Personal history of transient ischemic attack (TIA), and cerebral infarction without residual deficits: Secondary | ICD-10-CM | POA: Insufficient documentation

## 2012-09-12 DIAGNOSIS — J4489 Other specified chronic obstructive pulmonary disease: Secondary | ICD-10-CM | POA: Insufficient documentation

## 2012-09-12 DIAGNOSIS — Z87891 Personal history of nicotine dependence: Secondary | ICD-10-CM | POA: Insufficient documentation

## 2012-09-12 DIAGNOSIS — Z7902 Long term (current) use of antithrombotics/antiplatelets: Secondary | ICD-10-CM | POA: Insufficient documentation

## 2012-09-12 DIAGNOSIS — I5022 Chronic systolic (congestive) heart failure: Secondary | ICD-10-CM | POA: Insufficient documentation

## 2012-09-12 DIAGNOSIS — Z8711 Personal history of peptic ulcer disease: Secondary | ICD-10-CM | POA: Insufficient documentation

## 2012-09-12 DIAGNOSIS — Z79899 Other long term (current) drug therapy: Secondary | ICD-10-CM | POA: Insufficient documentation

## 2012-09-12 DIAGNOSIS — J449 Chronic obstructive pulmonary disease, unspecified: Secondary | ICD-10-CM | POA: Insufficient documentation

## 2012-09-12 DIAGNOSIS — Z8739 Personal history of other diseases of the musculoskeletal system and connective tissue: Secondary | ICD-10-CM | POA: Insufficient documentation

## 2012-09-12 DIAGNOSIS — Z8679 Personal history of other diseases of the circulatory system: Secondary | ICD-10-CM | POA: Insufficient documentation

## 2012-09-12 DIAGNOSIS — R5381 Other malaise: Secondary | ICD-10-CM | POA: Insufficient documentation

## 2012-09-12 DIAGNOSIS — E039 Hypothyroidism, unspecified: Secondary | ICD-10-CM | POA: Insufficient documentation

## 2012-09-12 DIAGNOSIS — I2510 Atherosclerotic heart disease of native coronary artery without angina pectoris: Secondary | ICD-10-CM | POA: Insufficient documentation

## 2012-09-12 DIAGNOSIS — I10 Essential (primary) hypertension: Secondary | ICD-10-CM | POA: Insufficient documentation

## 2012-09-12 DIAGNOSIS — I739 Peripheral vascular disease, unspecified: Secondary | ICD-10-CM | POA: Insufficient documentation

## 2012-09-12 DIAGNOSIS — N390 Urinary tract infection, site not specified: Secondary | ICD-10-CM | POA: Insufficient documentation

## 2012-09-12 DIAGNOSIS — N401 Enlarged prostate with lower urinary tract symptoms: Secondary | ICD-10-CM | POA: Insufficient documentation

## 2012-09-12 LAB — CBC WITH DIFFERENTIAL/PLATELET
Basophils Absolute: 0 10*3/uL (ref 0.0–0.1)
Lymphocytes Relative: 22 % (ref 12–46)
Lymphs Abs: 1.3 10*3/uL (ref 0.7–4.0)
Neutrophils Relative %: 65 % (ref 43–77)
Platelets: 168 10*3/uL (ref 150–400)
RBC: 4.11 MIL/uL — ABNORMAL LOW (ref 4.22–5.81)
RDW: 13.6 % (ref 11.5–15.5)
WBC: 6 10*3/uL (ref 4.0–10.5)

## 2012-09-12 LAB — URINALYSIS, ROUTINE W REFLEX MICROSCOPIC
Bilirubin Urine: NEGATIVE
Hgb urine dipstick: NEGATIVE
Ketones, ur: NEGATIVE mg/dL
Nitrite: NEGATIVE
Specific Gravity, Urine: 1.022 (ref 1.005–1.030)
pH: 5.5 (ref 5.0–8.0)

## 2012-09-12 LAB — TYPE AND SCREEN
ABO/RH(D): A POS
Antibody Screen: NEGATIVE

## 2012-09-12 LAB — COMPREHENSIVE METABOLIC PANEL
ALT: 12 U/L (ref 0–53)
AST: 19 U/L (ref 0–37)
Alkaline Phosphatase: 67 U/L (ref 39–117)
CO2: 32 mEq/L (ref 19–32)
Calcium: 9.1 mg/dL (ref 8.4–10.5)
GFR calc Af Amer: >90 mL/min (ref >90)
GFR calc non Af Amer: 86 mL/min — ABNORMAL LOW (ref >90)
Glucose, Bld: 99 mg/dL (ref 70–99)
Potassium: 4.4 mEq/L (ref 3.5–5.1)
Sodium: 127 mEq/L — ABNORMAL LOW (ref 135–145)
Total Protein: 6.1 g/dL (ref 6.0–8.3)

## 2012-09-12 LAB — URINE MICROSCOPIC-ADD ON

## 2012-09-12 LAB — PROTIME-INR
INR: 1.03 (ref 0.00–1.49)
Prothrombin Time: 13.4 seconds (ref 11.6–15.2)

## 2012-09-12 MED ORDER — DEXTROSE 5 % IV SOLN
1.0000 g | Freq: Once | INTRAVENOUS | Status: AC
  Administered 2012-09-12: 1 g via INTRAVENOUS
  Filled 2012-09-12: qty 10

## 2012-09-12 MED ORDER — SODIUM CHLORIDE 0.9 % IV SOLN
Freq: Once | INTRAVENOUS | Status: AC
  Administered 2012-09-12: 19:00:00 via INTRAVENOUS

## 2012-09-12 MED ORDER — CEPHALEXIN 500 MG PO CAPS: 500.0000 mg | ORAL_CAPSULE | Freq: Two times a day (BID) | ORAL | Status: DC

## 2012-09-12 MED ORDER — PANTOPRAZOLE SODIUM 40 MG IV SOLR
40.0000 mg | Freq: Once | INTRAVENOUS | Status: AC
  Administered 2012-09-12: 40 mg via INTRAVENOUS
  Filled 2012-09-12: qty 40

## 2012-09-12 NOTE — ED Provider Notes (Signed)
History     CSN: [id number]  Arrival date & time 09/12/12  1542   First MD Initiated Contact with Patient 09/12/12 1823      Chief Complaint  Patient presents with  . Diarrhea    (Consider location/radiation/quality/duration/timing/severity/associated sxs/prior treatment) HPI Comments: 79 y/o male comes in with cc of diarrhea, weakness. Pt reports about 1 week of loose BM and weakness. Pt's stools are dark. No blood. He has hx of PUD, COPD, Afib on Plavix, CAD, HTN. Pt has had no BRBPR, and he denies abdominal pain, nausea, emesis, fevers, chills, chest pains, shortness of breath, headaches, abdominal pain, uti like symptoms. No recent antibiotics. Pt is pretty independent at baseline.   Patient is a 79 y.o. male presenting with diarrhea. The history is provided by the patient.  Diarrhea Associated symptoms: no abdominal pain, no arthralgias, no chills, no fever, no headaches and no vomiting     Past Medical History  Diagnosis Date  . Abnormality of gait 09/28/2008  . Acquired deformity of chest and rib 01/14/2009  . Atrial fibrillation 09/30/2006  . BENIGN PROSTATIC HYPERTROPHY, WITH URINARY OBSTRUCTION 09/04/2009  . CHRONIC OBSTRUCTIVE PULMONARY DISEASE, ACUTE EXACERBATION 03/24/2007  . Chronic systolic heart failure 08/06/2008  . COPD 09/30/2006  . CORONARY ARTERY DISEASE 10/01/2006  . GERD 04/19/2007  . HYPERTENSION 09/30/2006  . HYPOTHYROIDISM 09/30/2006  . LASSITUDE 06/13/2007  . Osteoarth NOS-Unspec 09/30/2006  . PEPTIC ULCER DISEASE 09/30/2006  . PERIPHERAL VASCULAR DISEASE 09/30/2006  . SPINAL STENOSIS 08/21/2009  . STROKE 08/28/2008    Past Surgical History  Procedure Laterality Date  . Appendectomy    . Hernia repair    . Tonsillectomy    . Abdominal aortic aneurysm repair    . Transurethral resection of prostate    . Lumbar laminectomy    . Laryngoscopy and esophagoscopy  02/10/2012    Procedure: LARYNGOSCOPY AND ESOPHAGOSCOPY;  Surgeon: Flo Shanks, MD;  Location: [facility];  Service: ENT;  Laterality: N/A;  DIRECT LARYNGOSCOPY/REMOVAL FOREIGN BODY FROM ESOPHAGUS    History reviewed. No pertinent family history.  History  Substance Use Topics  . Smoking status: Former Smoker -- 0.80 packs/day for 20 years    Types: Cigarettes    Quit date: 04/06/1948  . Smokeless tobacco: Never Used  . Alcohol Use: No      Review of Systems  Constitutional: Positive for fatigue. Negative for fever, chills and activity change.  HENT: Negative for neck pain.   Eyes: Negative for visual disturbance.  Respiratory: Negative for cough, chest tightness and shortness of breath.   Cardiovascular: Negative for chest pain.  Gastrointestinal: Positive for nausea and diarrhea. Negative for vomiting, abdominal pain, blood in stool and abdominal distention.  Genitourinary: Negative for dysuria, enuresis and difficulty urinating.  Musculoskeletal: Negative for arthralgias.  Neurological: Positive for weakness. Negative for dizziness, light-headedness and headaches.  Psychiatric/Behavioral: Negative for confusion.    Allergies  Bleph-10 and Procaine hcl  Home Medications   Current Outpatient Rx  Name  Route  Sig  Dispense  Refill  . budesonide-formoterol (SYMBICORT) 160-4.5 MCG/ACT inhaler   Inhalation   Inhale 2 puffs into the lungs 2 (two) times daily.         . carvedilol (COREG) 6.25 MG tablet   Oral   Take 6.25 mg by mouth 2 (two) times daily with a meal.          . clopidogrel (PLAVIX) 75 MG tablet   Oral   Take 1 tablet (75 mg  total) by mouth daily.   90 tablet   3   . dutasteride (AVODART) 0.5 MG capsule   Oral   Take 0.5 mg by mouth every evening.         Marland Kitchen ipratropium (ATROVENT) 0.02 % nebulizer solution   Nebulization   Take 500 mcg by nebulization 4 (four) times daily.         Marland Kitchen ipratropium (ATROVENT) 0.02 % nebulizer solution      USE VIAL NEBULIZER TWICE A DAY   62.5 mL   5   . levothyroxine (SYNTHROID, LEVOTHROID) 50 MCG tablet    Oral   Take 50 mcg by mouth daily.         Marland Kitchen LORazepam (ATIVAN) 0.5 MG tablet   Oral   Take 0.5 mg by mouth every 8 (eight) hours as needed. Anxiety         . pantoprazole (PROTONIX) 40 MG tablet   Oral   Take 40 mg by mouth daily.         . potassium chloride (K-DUR,KLOR-CON) 10 MEQ tablet   Oral   Take 1 tablet (10 mEq total) by mouth daily.   90 tablet   3   . spironolactone (ALDACTONE) 25 MG tablet   Oral   Take 25 mg by mouth 2 (two) times daily.         . Tamsulosin HCl (FLOMAX) 0.4 MG CAPS   Oral   Take 0.4 mg by mouth daily.         . traMADol (ULTRAM) 50 MG tablet   Oral   Take 1 tablet (50 mg total) by mouth every 6 (six) hours as needed for pain.   15 tablet   0     BP 131/78  Pulse 74  Temp(Src) 98 F (36.7 C) (Oral)  Resp 20  SpO2 98%  Physical Exam  Nursing note and vitals reviewed. Constitutional: He is oriented to person, place, and time. He appears well-developed.  HENT:  Head: Normocephalic and atraumatic.  Eyes: Conjunctivae and EOM are normal. Pupils are equal, round, and reactive to light.  Neck: Normal range of motion. Neck supple.  Cardiovascular: Normal rate and regular rhythm.   Murmur heard. Pulmonary/Chest: Effort normal and breath sounds normal.  Abdominal: Soft. Bowel sounds are normal. He exhibits no distension. There is no tenderness. There is no rebound and no guarding.  Neurological: He is alert and oriented to person, place, and time.  Skin: Skin is warm.    ED Course  Procedures (including critical care time)  Labs Reviewed  CBC WITH DIFFERENTIAL - Abnormal; Notable for the following:    RBC 4.11 (*)    Hemoglobin 11.9 (*)    HCT 34.7 (*)    All other components within normal limits  COMPREHENSIVE METABOLIC PANEL - Abnormal; Notable for the following:    Sodium 127 (*)    Chloride 91 (*)    Albumin 2.9 (*)    GFR calc non Af Amer 86 (*)    All other components within normal limits  URINALYSIS, ROUTINE  W REFLEX MICROSCOPIC - Abnormal; Notable for the following:    APPearance CLOUDY (*)    Leukocytes, UA MODERATE (*)    All other components within normal limits  URINE MICROSCOPIC-ADD ON - Abnormal; Notable for the following:    Bacteria, UA MANY (*)    All other components within normal limits  URINE CULTURE  CLOSTRIDIUM DIFFICILE BY PCR  APTT  PROTIME-INR  OCCULT BLOOD,  POC DEVICE  TYPE AND SCREEN   No results found.   No diagnosis found.    MDM   Date: 09/12/2012  Rate: 94  Rhythm: atrial fibrillation  QRS Axis: normal  Intervals: normal  ST/T Wave abnormalities: Non specific  Conduction Disutrbances:none  Narrative Interpretation:   Old EKG Reviewed: unchanged   DDx: Sepsis syndrome ACS syndrome ICH Stroke COPD exacerbation Infection - pneumonia/UTI/Cellulitis Dehydration Electrolyte abnormality Tox syndrome PUD/Gastritis/ulcers Diverticular bleed Colon cancer Rectal bleed Internal hemorrhoids External hemorrhoids  Pt comes in with cc of weakness. Hx not suggestive of a source of infection, and the exam doesn't either. Pt c/o dark stool - has PUD, but he had no melena, and guaiac neg stools. Will get Hb check.  Unsure why he is feeling weak. 0 SIRS criteria at arrival. Will get ACS labs.   11:01 PM Has a UTI. Hb stable. Will discharge.      Derwood Kaplan, MD 09/12/12 [phone]

## 2012-09-12 NOTE — Telephone Encounter (Signed)
Discussed pt with Dr. Bonnetta Barry needs to go to ED  Jackelyn Knife pt's caregiver told her need to go to ED. Velna Hatchet verbalized understanding will take to ED.

## 2012-09-12 NOTE — ED Notes (Signed)
Pt family states he has been nauseated with black diarrhea x 3 days. Last week family states pt was constipated.

## 2012-09-12 NOTE — Telephone Encounter (Signed)
Discussed pt with Dr. Amador Cunas and he said pt needs to go to the ED.  Called Velna Hatchet pt's caregiver and told her pt needs to go to the ED. Velna Hatchet verbalized understanding and will take pt to ED.

## 2012-09-12 NOTE — ED Notes (Signed)
Pt ambulated well with walker.

## 2012-09-12 NOTE — Telephone Encounter (Signed)
Patient Information:  Caller Name: [name]  Phone: [phone]  Patient: [name], [name]  Gender: Male  DOB: February 27, 1918  Age: 79 Years  PCP: Eleonore Chiquito [facility])  Office Follow Up:  Does the office need to follow up with this patient?: Yes  Instructions For The Office: Disposition: Go to ER or to Office with PCP Approval. No Appt available currently.  Please review and contact patient at  [phone].   Symptoms  Reason For Call & Symptoms: Found black diarrhea in his depends this am 6/9. Has had lot of continuous black diarrhea stools in the last 2 days.  Mild abdominal cramps intermittently  Reviewed Health History In EMR: Yes  Reviewed Medications In EMR: Yes  Reviewed Allergies In EMR: Yes  Reviewed Surgeries / Procedures: Yes  Date of Onset of Symptoms: 09/10/2012  Treatments Tried: Kaopactate  Treatments Tried Worked: No  Guideline(s) Used:  Diarrhea  Disposition Per Guideline:   Go to ED Now (or to Office with PCP Approval)  Reason For Disposition Reached:   Black bowel movements  Advice Given:  N/A  Patient Will Follow Care Advice:  YES

## 2012-09-13 LAB — ABO/RH: ABO/RH(D): A POS

## 2012-09-14 LAB — URINE CULTURE

## 2012-09-15 NOTE — ED Notes (Signed)
Post ED Visit - Positive Culture Follow-up  Culture report reviewed by antimicrobial stewardship pharmacist: [x]  Wes Dulaney, Pharm.D., BCPS []  Celedonio Miyamoto, Pharm.D., BCPS []  Georgina Pillion, Pharm.D., BCPS []  [place], [place].D., BCPS, AAHIVP []  Estella Husk, Pharm.D., BCPS, AAHIVP  Positive urine culture Treated with Cephalexin, organism sensitive to the same and no further patient follow-up is required at this time.  Larena Sox 09/15/2012, 7:05 PM

## 2012-10-28 ENCOUNTER — Encounter: Payer: Self-pay | Admitting: Internal Medicine

## 2012-10-28 ENCOUNTER — Ambulatory Visit (INDEPENDENT_AMBULATORY_CARE_PROVIDER_SITE_OTHER): Payer: Medicare Other | Admitting: Internal Medicine

## 2012-10-28 ENCOUNTER — Ambulatory Visit: Payer: Self-pay | Admitting: Internal Medicine

## 2012-10-28 VITALS — BP 100/60 | HR 74 | Temp 97.5°F | Resp 20 | Wt 123.0 lb

## 2012-10-28 DIAGNOSIS — I10 Essential (primary) hypertension: Secondary | ICD-10-CM

## 2012-10-28 DIAGNOSIS — J449 Chronic obstructive pulmonary disease, unspecified: Secondary | ICD-10-CM

## 2012-10-28 DIAGNOSIS — R269 Unspecified abnormalities of gait and mobility: Secondary | ICD-10-CM

## 2012-10-28 DIAGNOSIS — R531 Weakness: Secondary | ICD-10-CM

## 2012-10-28 DIAGNOSIS — J4489 Other specified chronic obstructive pulmonary disease: Secondary | ICD-10-CM

## 2012-10-28 DIAGNOSIS — R5383 Other fatigue: Secondary | ICD-10-CM

## 2012-10-28 DIAGNOSIS — E039 Hypothyroidism, unspecified: Secondary | ICD-10-CM

## 2012-10-28 MED ORDER — LORAZEPAM 0.5 MG PO TABS: 0.5000 mg | ORAL_TABLET | Freq: Three times a day (TID) | ORAL | Status: DC | PRN

## 2012-10-28 MED ORDER — LORAZEPAM 0.5 MG PO TABS: 0.5000 mg | ORAL_TABLET | Freq: Three times a day (TID) | ORAL | Status: AC | PRN

## 2012-10-28 NOTE — Patient Instructions (Signed)
Limit your sodium (Salt) intake  Return in 3 months for follow-up   

## 2012-10-28 NOTE — Progress Notes (Signed)
Subjective:    Patient ID: [name], male    DOB: February 19, 1918, 79 y.o.   MRN: [id number]  HPI  79 year old patient who has advanced COPD oxygen dependent. He also has a history of chronic atrial fibrillation and coronary artery disease. His history also chronic systolic heart failure. He continues to do remarkably well He remains at home but requires maximal assistance. He is immature with the assistance of a 4. walker short distances. 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 Abnormality of gait 09/28/2008  . Acquired deformity of chest and rib 01/14/2009  . Atrial fibrillation 09/30/2006  . BENIGN PROSTATIC HYPERTROPHY, WITH URINARY OBSTRUCTION 09/04/2009  . CHRONIC OBSTRUCTIVE PULMONARY DISEASE, ACUTE EXACERBATION 03/24/2007  . Chronic systolic heart failure 08/06/2008  . COPD 09/30/2006  . CORONARY ARTERY DISEASE 10/01/2006  . GERD 04/19/2007  . HYPERTENSION 09/30/2006  . HYPOTHYROIDISM 09/30/2006  . LASSITUDE 06/13/2007  . Osteoarth NOS-Unspec 09/30/2006  . PEPTIC ULCER DISEASE 09/30/2006  . PERIPHERAL VASCULAR DISEASE 09/30/2006  . SPINAL STENOSIS 08/21/2009  . STROKE 08/28/2008    History   Social History  . Marital Status: Married    Spouse Name: N/A    Number of Children: N/A  . Years of Education: N/A   Occupational History  . 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 Smoking status: Former Smoker -- 0.80 packs/day for 20 years    Types: Cigarettes    Quit date: 04/06/1948  . Smokeless tobacco: Never Used  . Alcohol Use: No  . Drug Use: No  . Sexually Active: Not on file   Other Topics Concern  . Not on file   Social History Narrative   Lives at home with wife and has caretaker, still ambulatory daily with a walker. Had 2 children, 1 passed  away.      Past Surgical History  Procedure Laterality Date  . Appendectomy    . Hernia repair    . Tonsillectomy    . Abdominal aortic aneurysm repair    . Transurethral resection of prostate    . Lumbar laminectomy    . Laryngoscopy and esophagoscopy  02/10/2012    Procedure: LARYNGOSCOPY AND ESOPHAGOSCOPY;  Surgeon: Flo Shanks, MD;  Location: [facility];  Service: ENT;  Laterality: N/A;  DIRECT LARYNGOSCOPY/REMOVAL FOREIGN BODY FROM ESOPHAGUS    No family history on file.  Allergies  Allergen Reactions  . Bleph-10 (Sulfacetamide Sodium) Other (See Comments)    Unknown   . Procaine Hcl     REACTION: syncope    Current Outpatient Prescriptions on File Prior to Visit  Medication Sig Dispense Refill  . budesonide-formoterol (SYMBICORT) 160-4.5 MCG/ACT inhaler Inhale 2 puffs into the lungs 2 (two) times daily.      . carvedilol (COREG) 6.25 MG tablet Take 6.25 mg by mouth 2 (two) times daily with a meal.       . clopidogrel (PLAVIX) 75 MG tablet Take 1 tablet (75 mg total) by mouth daily.  90 tablet  3  . dutasteride (AVODART) 0.5 MG capsule Take 0.5 mg by mouth every evening.      Marland Kitchen ipratropium (ATROVENT) 0.02 % nebulizer solution Take 500 mcg by nebulization 4 (four) times daily.      Marland Kitchen ipratropium (ATROVENT) 0.02 %  nebulizer solution USE VIAL NEBULIZER TWICE A DAY  62.5 mL  5  . levothyroxine (SYNTHROID, LEVOTHROID) 50 MCG tablet Take 50 mcg by mouth daily.      . pantoprazole (PROTONIX) 40 MG tablet Take 40 mg by mouth daily.      . potassium chloride (K-DUR,KLOR-CON) 10 MEQ tablet Take 1 tablet (10 mEq total) by mouth daily.  90 tablet  3  . spironolactone (ALDACTONE) 25 MG tablet Take 25 mg by mouth 2 (two) times daily.      . Tamsulosin HCl (FLOMAX) 0.4 MG CAPS Take 0.4 mg by mouth daily.      . traMADol (ULTRAM) 50 MG tablet Take 1 tablet (50 mg total) by mouth every 6 (six) hours as needed for pain.  15 tablet  0   No current facility-administered medications on file  prior to visit.    BP 100/60  Pulse 74  Temp(Src) 97.5 F (36.4 C) (Oral)  Resp 20  Wt 123 lb (55.792 kg)  BMI 16.92 kg/m2  SpO2 84%       Review of Systems  Constitutional: Negative for fever, chills, appetite change and fatigue.  HENT: Negative for hearing loss, ear pain, congestion, sore throat, trouble swallowing, neck stiffness, dental problem, voice change and tinnitus.   Eyes: Negative for pain, discharge and visual disturbance.  Respiratory: Positive for shortness of breath. Negative for cough, chest tightness, wheezing and stridor.   Cardiovascular: Negative for chest pain, palpitations and leg swelling.  Gastrointestinal: Negative for nausea, vomiting, abdominal pain, diarrhea, constipation, blood in stool and abdominal distention.  Genitourinary: Negative for urgency, hematuria, flank pain, discharge, difficulty urinating and genital sores.  Musculoskeletal: Positive for back pain. Negative for myalgias, joint swelling, arthralgias and gait problem.  Skin: Negative for rash.  Neurological: Positive for weakness. Negative for dizziness, syncope, speech difficulty, numbness and headaches.  Hematological: Negative for adenopathy. Does not bruise/bleed easily.  Psychiatric/Behavioral: Negative for behavioral problems and dysphoric mood. The patient is not nervous/anxious.        Objective:   Physical Exam  Constitutional: He is oriented to person, place, and time. He appears well-developed.  Elderly Frail No acute distress  Blood pressure 100/60 Chronic oxygen therapy  HENT:  Head: Normocephalic.  Right Ear: External ear normal.  Left Ear: External ear normal.  Eyes: Conjunctivae and EOM are normal.  Neck: Normal range of motion.  Cardiovascular: Normal rate and normal heart sounds.   Controlled ventricular response  Pulmonary/Chest: Effort normal.  Decreased breath sounds  O2 saturation 88  Abdominal: Bowel sounds are normal.  Musculoskeletal: Normal  range of motion. He exhibits no edema and no tenderness.  Neurological: He is alert and oriented to person, place, and time.  Psychiatric: He has a normal mood and affect. His behavior is normal.          Assessment & Plan:   Advanced COPD. Oxygen dependent Chronic systolic heart failure Chronic atrial fibrillation  Forms completed Medicines updated and refilled  Return in 3 months for followup

## 2012-10-31 ENCOUNTER — Ambulatory Visit: Payer: Self-pay | Admitting: Internal Medicine

## 2012-11-01 ENCOUNTER — Ambulatory Visit: Payer: Self-pay | Admitting: Internal Medicine

## 2012-11-04 ENCOUNTER — Other Ambulatory Visit: Payer: Self-pay | Admitting: Internal Medicine

## 2012-11-22 ENCOUNTER — Encounter: Payer: Self-pay | Admitting: Internal Medicine

## 2012-11-22 ENCOUNTER — Telehealth: Payer: Self-pay | Admitting: Internal Medicine

## 2012-11-22 ENCOUNTER — Ambulatory Visit (INDEPENDENT_AMBULATORY_CARE_PROVIDER_SITE_OTHER): Payer: Medicare Other | Admitting: Internal Medicine

## 2012-11-22 VITALS — BP 104/60 | HR 83 | Temp 97.7°F | Resp 22 | Wt 121.0 lb

## 2012-11-22 DIAGNOSIS — R5381 Other malaise: Secondary | ICD-10-CM

## 2012-11-22 DIAGNOSIS — J449 Chronic obstructive pulmonary disease, unspecified: Secondary | ICD-10-CM

## 2012-11-22 DIAGNOSIS — R531 Weakness: Secondary | ICD-10-CM

## 2012-11-22 DIAGNOSIS — I5022 Chronic systolic (congestive) heart failure: Secondary | ICD-10-CM

## 2012-11-22 DIAGNOSIS — I10 Essential (primary) hypertension: Secondary | ICD-10-CM

## 2012-11-22 DIAGNOSIS — R0600 Dyspnea, unspecified: Secondary | ICD-10-CM

## 2012-11-22 DIAGNOSIS — R0609 Other forms of dyspnea: Secondary | ICD-10-CM

## 2012-11-22 MED ORDER — AZITHROMYCIN 250 MG PO TABS: ORAL_TABLET | ORAL | Status: DC

## 2012-11-22 MED ORDER — ALBUTEROL SULFATE (2.5 MG/3ML) 0.083% IN NEBU: 2.5000 mg | INHALATION_SOLUTION | Freq: Four times a day (QID) | RESPIRATORY_TRACT | Status: AC | PRN

## 2012-11-22 NOTE — Telephone Encounter (Signed)
Made appt for this afternoon w/ Dr Kirtland Bouchard per Lupita Leash. Patient is coming at 3:30 - I spoke with caregiver, Velna Hatchet, and she will make sure Gabriel Rung is here.

## 2012-11-22 NOTE — Patient Instructions (Signed)
Call or return to clinic prn if these symptoms worsen or fail to improve as anticipated.  Albuterol nebulizer treatments as discussed  Use  Symbicort twice daily

## 2012-11-22 NOTE — Telephone Encounter (Signed)
Patient Information:  Caller Name: [name]  Phone: [phone]  Patient: [name], [name]  Gender: Male  DOB: 09-28-1917  Age: 79 Years  PCP: Eleonore Chiquito [facility])  Office Follow Up:  Does the office need to follow up with this patient?: Yes  Instructions For The Office: Yes, please review with MD and follow up with patient.  RN Note:  Refuses to call 911 for symptoms; does not want to go to ED.  Joe says it is not his heart.  Wants to see Dr. Amador Cunas only. Informed that his symptoms warrent emergency treatement in ED and risks of delayed care including death. Informed message will be sent to MD advising patient refuses to go to ED or call 911.  Symptoms  Reason For Call & Symptoms: Chest discomfort "in my lungs", congestion, productive cough, and shortness of breath. Used NTG 11/21/12  for indigestion and left rib pain with relief.  No diaphoresis or nausea. No pain in left arm, neck or jaw.  Reviewed Health History In EMR: Yes  Reviewed Medications In EMR: Yes  Reviewed Allergies In EMR: Yes  Reviewed Surgeries / Procedures: Yes  Date of Onset of Symptoms: 11/21/2012  Treatments Tried: Atrovent neb, Symbicort  Treatments Tried Worked: Yes  Guideline(s) Used:  Breathing Difficulty  Chest Pain  Disposition Per Guideline:   Call EMS 911 Now  Reason For Disposition Reached:   Chest pain lasting longer than 5 minutes and ANY of the following:  Over 98 years old Over 68 years old and at least one cardiac risk factor (i.e., high blood pressure, diabetes, high cholesterol, obesity, smoker or strong family history of heart disease) Pain is crushing, pressure-like, or heavy  Took nitroglycerin and chest pain was not relieved History of heart disease (i.e., angina, heart attack, bypass surgery, angioplasty, CHF)  Advice Given:  N/A  Patient Refused Recommendation:  Patient Refused Care Advice  Requests office appointment with Dr Amador Cunas. Please  forward information to MD.

## 2012-11-22 NOTE — Progress Notes (Signed)
Subjective:    Patient ID: [name], male    DOB: [birth year], 79 y.o.   MRN: [id number]  HPI  79 year old patient who is seen today as a work in complaining of increasing shortness of breath. He has had increasing cough which has been largely nonproductive but occasionally yielding scant amount of yellow sputum. He describes an increasing indigestion with frequent hiccups; he generally feels unwell.  He is accompanied by his caregiver and apparently he is much improved compared to last week. He has a history of advanced oxygen-dependent COPD as well as chronic systolic heart failure. He is on chronic oxygen therapy at 2 L per minute. No fever  Past Medical History  Diagnosis Date  . Abnormality of gait 09/28/2008  . Acquired deformity of chest and rib 01/14/2009  . Atrial fibrillation 09/30/2006  . BENIGN PROSTATIC HYPERTROPHY, WITH URINARY OBSTRUCTION 09/04/2009  . CHRONIC OBSTRUCTIVE PULMONARY DISEASE, ACUTE EXACERBATION 03/24/2007  . Chronic systolic heart failure 08/06/2008  . COPD 09/30/2006  . CORONARY ARTERY DISEASE 10/01/2006  . GERD 04/19/2007  . HYPERTENSION 09/30/2006  . HYPOTHYROIDISM 09/30/2006  . LASSITUDE 06/13/2007  . Osteoarth NOS-Unspec 09/30/2006  . PEPTIC ULCER DISEASE 09/30/2006  . PERIPHERAL VASCULAR DISEASE 09/30/2006  . SPINAL STENOSIS 08/21/2009  . STROKE 08/28/2008    History   Social History  . Marital Status: Married    Spouse Name: N/A    Number of Children: N/A  . Years of Education: N/A   Occupational History  . 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 Smoking status: Former Smoker -- 0.80 packs/day for 20 years    Types: Cigarettes    Quit date: 04/06/1948  . Smokeless tobacco: Never Used  . Alcohol Use: No  . Drug Use: No  . Sexual Activity: Not on file   Other Topics Concern  . Not on file   Social History Narrative   Lives at home with wife and has caretaker, still ambulatory daily with a  walker. Had 2 children, 1 passed away.      Past Surgical History  Procedure Laterality Date  . Appendectomy    . Hernia repair    . Tonsillectomy    . Abdominal aortic aneurysm repair    . Transurethral resection of prostate    . Lumbar laminectomy    . Laryngoscopy and esophagoscopy  02/10/2012    Procedure: LARYNGOSCOPY AND ESOPHAGOSCOPY;  Surgeon: Flo Shanks, MD;  Location: [facility];  Service: ENT;  Laterality: N/A;  DIRECT LARYNGOSCOPY/REMOVAL FOREIGN BODY FROM ESOPHAGUS    No family history on file.  Allergies  Allergen Reactions  . Bleph-10 [Sulfacetamide Sodium] Other (See Comments)    Unknown   . Procaine Hcl     REACTION: syncope    Current Outpatient Prescriptions on File Prior to Visit  Medication Sig Dispense Refill  . budesonide-formoterol (SYMBICORT) 160-4.5 MCG/ACT inhaler Inhale 2 puffs into the lungs 2 (two) times daily.      . carvedilol (COREG) 6.25 MG tablet Take 6.25 mg by mouth 2 (two) times daily with a meal.       . clopidogrel (PLAVIX) 75 MG tablet Take 1 tablet (75 mg total) by mouth daily.  90 tablet  3  . dutasteride (AVODART) 0.5 MG capsule Take 0.5 mg by mouth every evening.      Marland Kitchen ipratropium (ATROVENT) 0.02 % nebulizer solution USE 1 VIAL IN NEBULIZER TWICE A DAY  62.5 mL  5  . levothyroxine (SYNTHROID, LEVOTHROID) 50 MCG tablet Take 50 mcg by mouth daily.      Marland Kitchen LORazepam (ATIVAN) 0.5 MG tablet Take 1 tablet (0.5 mg total) by mouth every 8 (eight) hours as needed. Anxiety  30 tablet  1  . pantoprazole (PROTONIX) 40 MG tablet Take 40 mg by mouth daily.      . potassium chloride (K-DUR,KLOR-CON) 10 MEQ tablet Take 1 tablet (10 mEq total) by mouth daily.  90 tablet  3  . spironolactone (ALDACTONE) 25 MG tablet Take 25 mg by mouth 2 (two) times daily.      . Tamsulosin HCl (FLOMAX) 0.4 MG CAPS Take 0.4 mg by mouth daily.      . traMADol (ULTRAM) 50 MG tablet Take 1 tablet (50 mg total) by mouth every 6 (six) hours as needed for pain.  15 tablet  0    No current facility-administered medications on file prior to visit.    BP 104/60  Pulse 83  Temp(Src) 97.7 F (36.5 C) (Oral)  Resp 22  Wt 121 lb (54.885 kg)  BMI 16.64 kg/m2  SpO2 88%       Review of Systems  Constitutional: Positive for fatigue. Negative for fever, chills and appetite change.  HENT: Negative for hearing loss, ear pain, congestion, sore throat, trouble swallowing, neck stiffness, dental problem, voice change and tinnitus.   Eyes: Negative for pain, discharge and visual disturbance.  Respiratory: Positive for cough and shortness of breath. Negative for chest tightness, wheezing and stridor.   Cardiovascular: Negative for chest pain, palpitations and leg swelling.  Gastrointestinal: Negative for nausea, vomiting, abdominal pain, diarrhea, constipation, blood in stool and abdominal distention.  Genitourinary: Negative for urgency, hematuria, flank pain, discharge, difficulty urinating and genital sores.  Musculoskeletal: Negative for myalgias, back pain, joint swelling, arthralgias and gait problem.  Skin: Negative for rash.  Neurological: Negative for dizziness, syncope, speech difficulty, weakness, numbness and headaches.  Hematological: Negative for adenopathy. Does not bruise/bleed easily.  Psychiatric/Behavioral: Negative for behavioral problems and dysphoric mood. The patient is not nervous/anxious.        Objective:   Physical Exam  Constitutional: He is oriented to person, place, and time. He appears well-developed and well-nourished.  Appears weak and chronically ill but in no acute distress Oxygen saturation after ambulation to the examining room was 88 Oxygen saturation after rest 99%  HENT:  Head: Normocephalic.  Right Ear: External ear normal.  Left Ear: External ear normal.  Eyes: Conjunctivae and EOM are normal.  Neck: Normal range of motion.  Cardiovascular: Normal rate and normal heart sounds.   Pulmonary/Chest:  Decreased breath  sounds and clear No respiratory distress  Abdominal: Bowel sounds are normal. He exhibits no distension. There is no tenderness. There is no rebound.  Musculoskeletal: Normal range of motion. He exhibits no edema and no tenderness.  Neurological: He is alert and oriented to person, place, and time.  Psychiatric: He has a normal mood and affect. His behavior is normal.          Assessment & Plan:   Advanced COPD. Patient has not been using Symbicort. Nebulizer treatments consist of Atrovent only. We'll switch to DuoNeb and encouraged Symbicort twice a day Hypertension CAD Chronic systolic heart failure Probable mild viral syndrome which seems to be improving

## 2012-12-08 ENCOUNTER — Other Ambulatory Visit: Payer: Self-pay | Admitting: Internal Medicine

## 2012-12-22 ENCOUNTER — Ambulatory Visit (INDEPENDENT_AMBULATORY_CARE_PROVIDER_SITE_OTHER): Payer: Medicare Other | Admitting: *Deleted

## 2012-12-22 DIAGNOSIS — Z23 Encounter for immunization: Secondary | ICD-10-CM

## 2013-01-20 ENCOUNTER — Telehealth: Payer: Self-pay | Admitting: Internal Medicine

## 2013-01-24 ENCOUNTER — Telehealth: Payer: Self-pay | Admitting: *Deleted

## 2013-01-24 NOTE — Telephone Encounter (Signed)
Pt called for an appt today with Dr.K no openings available and pt offered an appt a [facility] but does not want to go, pt call transferred to me. I spoke to pt asked him what is wrong? Pt stated I just do not feel good and want to see Dr.K will not go to [facility]. Asked pt what symptoms having? Pt said he is a little short of breath. Asked pt if using oxygen and doing Nebulizer Tx and using inhaler? Pt stated yes. Told pt okay he can see Dr.K tomorrow but if any increase in SOB has to go to the ED. Pt verbalized understanding and stated I will. Told pt okay will transfer to scheduling , pt transferred.

## 2013-01-25 ENCOUNTER — Encounter: Payer: Self-pay | Admitting: Internal Medicine

## 2013-01-25 ENCOUNTER — Ambulatory Visit (INDEPENDENT_AMBULATORY_CARE_PROVIDER_SITE_OTHER): Payer: Medicare Other | Admitting: Internal Medicine

## 2013-01-25 VITALS — BP 90/60 | HR 54 | Temp 97.8°F | Resp 22 | Wt 121.0 lb

## 2013-01-25 DIAGNOSIS — J449 Chronic obstructive pulmonary disease, unspecified: Secondary | ICD-10-CM

## 2013-01-25 DIAGNOSIS — I10 Essential (primary) hypertension: Secondary | ICD-10-CM

## 2013-01-25 DIAGNOSIS — I5022 Chronic systolic (congestive) heart failure: Secondary | ICD-10-CM

## 2013-01-25 DIAGNOSIS — I2510 Atherosclerotic heart disease of native coronary artery without angina pectoris: Secondary | ICD-10-CM

## 2013-01-25 DIAGNOSIS — I4891 Unspecified atrial fibrillation: Secondary | ICD-10-CM

## 2013-01-25 DIAGNOSIS — E039 Hypothyroidism, unspecified: Secondary | ICD-10-CM

## 2013-01-25 NOTE — Progress Notes (Signed)
Subjective:    Patient ID: [name], male    DOB: [birth year], 79 y.o.   MRN: [id number]  HPI  79 year old patient who is seen for followup today. He has a history of chronic systolic heart failure as well as advanced COPD. He remains on chronic oxygen therapy at 3 L of oxygen per minute. He continues to receive  home nebulizer treatments. His status is about the same. He has chronic fatigue and shortness of breath. No severe dyspnea or sputum production. No fever. He states that he has been able to maintain his weight which is unchanged.  Wt Readings from Last 3 Encounters:  01/25/13 121 lb (54.885 kg)  11/22/12 121 lb (54.885 kg)  10/28/12 123 lb (55.792 kg)   Past Medical History  Diagnosis Date  . Abnormality of gait 09/28/2008  . Acquired deformity of chest and rib 01/14/2009  . Atrial fibrillation 09/30/2006  . BENIGN PROSTATIC HYPERTROPHY, WITH URINARY OBSTRUCTION 09/04/2009  . CHRONIC OBSTRUCTIVE PULMONARY DISEASE, ACUTE EXACERBATION 03/24/2007  . Chronic systolic heart failure 08/06/2008  . COPD 09/30/2006  . CORONARY ARTERY DISEASE 10/01/2006  . GERD 04/19/2007  . HYPERTENSION 09/30/2006  . HYPOTHYROIDISM 09/30/2006  . LASSITUDE 06/13/2007  . Osteoarth NOS-Unspec 09/30/2006  . PEPTIC ULCER DISEASE 09/30/2006  . PERIPHERAL VASCULAR DISEASE 09/30/2006  . SPINAL STENOSIS 08/21/2009  . STROKE 08/28/2008    History   Social History  . Marital Status: Married    Spouse Name: N/A    Number of Children: N/A  . Years of Education: N/A   Occupational History  . 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 Smoking status: Former Smoker -- 0.80 packs/day for 20 years    Types: Cigarettes    Quit date: 04/06/1948  . Smokeless tobacco: Never Used  . Alcohol Use: No  . Drug Use: No  . Sexual Activity: Not on file   Other Topics Concern  . Not on file   Social History Narrative   Lives at home with wife and has caretaker, still  ambulatory daily with a walker. Had 2 children, 1 passed away.      Past Surgical History  Procedure Laterality Date  . Appendectomy    . Hernia repair    . Tonsillectomy    . Abdominal aortic aneurysm repair    . Transurethral resection of prostate    . Lumbar laminectomy    . Laryngoscopy and esophagoscopy  02/10/2012    Procedure: LARYNGOSCOPY AND ESOPHAGOSCOPY;  Surgeon: Flo Shanks, MD;  Location: [facility];  Service: ENT;  Laterality: N/A;  DIRECT LARYNGOSCOPY/REMOVAL FOREIGN BODY FROM ESOPHAGUS    No family history on file.  Allergies  Allergen Reactions  . Bleph-10 [Sulfacetamide Sodium] Other (See Comments)    Unknown   . Procaine Hcl     REACTION: syncope    Current Outpatient Prescriptions on File Prior to Visit  Medication Sig Dispense Refill  . albuterol (PROVENTIL) (2.5 MG/3ML) 0.083% nebulizer solution Take 3 mL (2.5 mg total) by nebulization every 6 (six) hours as needed for wheezing.  75 mL  12  . azithromycin (ZITHROMAX) 250 MG tablet 2 once daily for 3 consecutive days  6 tablet  0  . budesonide-formoterol (SYMBICORT) 160-4.5 MCG/ACT inhaler Inhale 2 puffs into the lungs 2 (two) times daily.      . carvedilol (COREG) 6.25 MG tablet Take 6.25 mg by mouth 2 (two) times daily with a meal.       .  clopidogrel (PLAVIX) 75 MG tablet Take 1 tablet (75 mg total) by mouth daily.  90 tablet  3  . dutasteride (AVODART) 0.5 MG capsule Take 0.5 mg by mouth every evening.      Marland Kitchen ipratropium (ATROVENT) 0.02 % nebulizer solution USE 1 VIAL IN NEBULIZER TWICE A DAY  62.5 mL  5  . levothyroxine (SYNTHROID, LEVOTHROID) 50 MCG tablet Take 50 mcg by mouth daily.      Marland Kitchen LORazepam (ATIVAN) 0.5 MG tablet Take 1 tablet (0.5 mg total) by mouth every 8 (eight) hours as needed. Anxiety  30 tablet  1  . pantoprazole (PROTONIX) 40 MG tablet Take 40 mg by mouth daily.      . potassium chloride (K-DUR,KLOR-CON) 10 MEQ tablet TAKE 1 TABLET BY MOUTH EVERY DAY  90 tablet  1  . spironolactone  (ALDACTONE) 25 MG tablet Take 25 mg by mouth 2 (two) times daily.      . Tamsulosin HCl (FLOMAX) 0.4 MG CAPS Take 0.4 mg by mouth daily.      . traMADol (ULTRAM) 50 MG tablet Take 1 tablet (50 mg total) by mouth every 6 (six) hours as needed for pain.  15 tablet  0   No current facility-administered medications on file prior to visit.    BP 90/60  Pulse 54  Temp(Src) 97.8 F (36.6 C) (Oral)  Resp 22  Wt 121 lb (54.885 kg)  BMI 16.64 kg/m2  SpO2 85%     Review of Systems  Constitutional: Positive for fatigue. Negative for fever, chills and appetite change.  HENT: Negative for congestion, dental problem, ear pain, hearing loss, sore throat, tinnitus, trouble swallowing and voice change.   Eyes: Negative for pain, discharge and visual disturbance.  Respiratory: Positive for shortness of breath. Negative for cough, chest tightness, wheezing and stridor.   Cardiovascular: Negative for chest pain, palpitations and leg swelling.  Gastrointestinal: Negative for nausea, vomiting, abdominal pain, diarrhea, constipation, blood in stool and abdominal distention.  Genitourinary: Negative for urgency, hematuria, flank pain, discharge, difficulty urinating and genital sores.  Musculoskeletal: Negative for arthralgias, back pain, gait problem, joint swelling, myalgias and neck stiffness.  Skin: Negative for rash.  Neurological: Positive for weakness. Negative for dizziness, syncope, speech difficulty, numbness and headaches.  Hematological: Negative for adenopathy. Does not bruise/bleed easily.  Psychiatric/Behavioral: Negative for behavioral problems and dysphoric mood. The patient is not nervous/anxious.        Objective:   Physical Exam  Constitutional: He is oriented to person, place, and time. He appears well-developed.  Afebrile Elderly frail Blood pressure low normal Pulse rate in the 60s and irregular  HENT:  Head: Normocephalic.  Right Ear: External ear normal.  Left Ear:  External ear normal.  Eyes: Conjunctivae and EOM are normal.  Neck: Normal range of motion.  Cardiovascular: Normal rate and normal heart sounds.   Pulmonary/Chest: Effort normal. No respiratory distress. He has no wheezes. He has no rales.  Diminished breath sounds but clear  Abdominal: Bowel sounds are normal.  Musculoskeletal: Normal range of motion. He exhibits no edema and no tenderness.  Neurological: He is alert and oriented to person, place, and time.  Psychiatric: He has a normal mood and affect. His behavior is normal.          Assessment & Plan:   Advanced COPD oxygen dependent due to chronic respiratory failure Chronic systolic heart failure Hypertension stable Hypothyroidism  No change in regimen. He apparently was recently seen at the [facility] and laboratory  studies were performed Recheck in 3 months with lab No change in regimen

## 2013-01-25 NOTE — Patient Instructions (Signed)
Limit your sodium (Salt) intake  Return in 3 months for follow-up   

## 2013-02-01 ENCOUNTER — Telehealth: Payer: Self-pay | Admitting: Internal Medicine

## 2013-02-01 NOTE — Telephone Encounter (Signed)
[facility], [name], is requesting home health for pt through [facility].  Weakness, back pain, fall risk and copd and chf are the reasons for home health. They are to fax another form today which I will forward to you!!

## 2013-02-06 NOTE — Telephone Encounter (Signed)
Received fax on Thursday and given to Dr. Kirtland Bouchard to fill out.

## 2013-02-08 ENCOUNTER — Encounter: Payer: Self-pay | Admitting: *Deleted

## 2013-02-10 ENCOUNTER — Encounter: Payer: Self-pay | Admitting: Internal Medicine

## 2013-02-10 ENCOUNTER — Telehealth: Payer: Self-pay | Admitting: Internal Medicine

## 2013-02-10 ENCOUNTER — Ambulatory Visit (INDEPENDENT_AMBULATORY_CARE_PROVIDER_SITE_OTHER): Payer: Medicare Other | Admitting: Internal Medicine

## 2013-02-10 VITALS — BP 90/50 | HR 73 | Ht 69.0 in | Wt 118.1 lb

## 2013-02-10 DIAGNOSIS — R5381 Other malaise: Secondary | ICD-10-CM

## 2013-02-10 DIAGNOSIS — I2510 Atherosclerotic heart disease of native coronary artery without angina pectoris: Secondary | ICD-10-CM

## 2013-02-10 DIAGNOSIS — I4891 Unspecified atrial fibrillation: Secondary | ICD-10-CM

## 2013-02-10 DIAGNOSIS — I951 Orthostatic hypotension: Secondary | ICD-10-CM | POA: Insufficient documentation

## 2013-02-10 DIAGNOSIS — R197 Diarrhea, unspecified: Secondary | ICD-10-CM | POA: Insufficient documentation

## 2013-02-10 DIAGNOSIS — R531 Weakness: Secondary | ICD-10-CM

## 2013-02-10 NOTE — Patient Instructions (Addendum)
Try Pedialyte to help with hydration & replacement of electrolytes.   Hold your fluid pill - spironolactone - while having diarrhea episodes so that extra fluid is not being lost.  Hold for about 1 week.   Decrease your fluid pill - spironolactone - to 25mg  daily.   Your physician wants you to follow-up in: 6 months. You will receive a reminder letter in the mail two months in advance. If you don't receive a letter, please call our office to schedule the follow-up appointment.

## 2013-02-10 NOTE — Progress Notes (Signed)
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 His past medical history is significant for advanced COPD on oxygen. He quit smoking more than 60 years ago. Unfortunately a severe thoracic kyphosis and moderate scoliosis. He has a history of MI in the past and underwent multiple stents which were placed in his coronaries in [facility] about 12 years ago. He's had problems with urinary retention but this is improved on medications. He also has permanent atrial fibrillation, but is not on anticoagulation due to concern of fall risk. In fact he was only taking Plavix monotherapy the past several days he's noted some diarrhea and does feel slightly weaker than typical. His blood pressure was noted to be low today at 90/50. It should be noted that he was previously on 12.5 mg of Aldactone according to Dr. Kandis Cocking notes, but reports taking 25 mg twice daily of Aldactone based on his most recent prescription. He denies any chest pain or shortness of breath, mostly just generalized weakness.  PMHx:  Past Medical History  Diagnosis Date  . Abnormality of gait 09/28/2008  . Acquired deformity of chest and rib 01/14/2009  . Atrial fibrillation 09/30/2006  . BENIGN PROSTATIC HYPERTROPHY, WITH URINARY OBSTRUCTION 09/04/2009  . CHRONIC OBSTRUCTIVE PULMONARY DISEASE, ACUTE EXACERBATION 03/24/2007  . Chronic systolic heart failure 08/06/2008  . COPD 09/30/2006  . CORONARY ARTERY DISEASE 10/01/2006    stents placed [facility])  . GERD 04/19/2007  . HYPERTENSION 09/30/2006  . HYPOTHYROIDISM 09/30/2006  . LASSITUDE 06/13/2007  . Osteoarth NOS-Unspec 09/30/2006  . PEPTIC ULCER DISEASE 09/30/2006  . PERIPHERAL VASCULAR DISEASE 09/30/2006  . SPINAL STENOSIS 08/21/2009  . STROKE  08/28/2008  . Kyphoscoliosis     Past Surgical History  Procedure Laterality Date  . Appendectomy    . Hernia repair    . Tonsillectomy    . Abdominal aortic aneurysm repair  [address] [place]  . Transurethral resection of prostate    . Lumbar laminectomy    . Laryngoscopy and esophagoscopy  02/10/2012    Procedure: LARYNGOSCOPY AND ESOPHAGOSCOPY;  Surgeon: Flo Shanks, MD;  Location: [facility];  Service: ENT;  Laterality: N/A;  DIRECT LARYNGOSCOPY/REMOVAL FOREIGN BODY FROM ESOPHAGUS  . Transthoracic echocardiogram  08/2011    EF 45-50%, mild conc LVH; LA severely dilated; RA mod dilated; mod calcif of MV; trace MR; mild TR, RVSP elevated; AV mod sclerotic; mild-mod valvular AS & mild regurg; trace pulm valve regurg  . Elbow surgery  2011  . Cardiac catheterization  2005    3.5x23 Cypher stent to prox & ostial RCA    FAMHx:  Family History  Problem Relation Age of Onset  . Heart attack Mother   . Tuberculosis Father   . Heart Problems Maternal Grandmother   . Other Maternal Grandfather     stomach problems  . Heart Problems Sister     x2  . Meniere's disease Daughter     SOCHx:   reports that he quit smoking about 64 years ago. His smoking use included Cigarettes. He has a 16 pack-year smoking history. He has never used smokeless tobacco. He reports that he does not drink alcohol or use illicit drugs.  ALLERGIES:  Allergies  Allergen Reactions  . Bleph-10 [Sulfacetamide Sodium] Other (  See Comments)    Unknown   . Procaine Hcl     REACTION: syncope    ROS: A comprehensive review of systems was negative except for: Constitutional: positive for fatigue and weakness Cardiovascular: positive for irregular heart beat and palpitations Gastrointestinal: positive for diarrhea  HOME MEDS: Current Outpatient Prescriptions  Medication Sig Dispense Refill  . albuterol (PROVENTIL) (2.5 MG/3ML) 0.083% nebulizer solution Take 3 mL (2.5 mg total) by nebulization every 6 (six)  hours as needed for wheezing.  75 mL  12  . budesonide-formoterol (SYMBICORT) 160-4.5 MCG/ACT inhaler Inhale 2 puffs into the lungs 2 (two) times daily.      . carvedilol (COREG) 12.5 MG tablet Take 12.5 mg by mouth 2 (two) times daily with a meal.      . clopidogrel (PLAVIX) 75 MG tablet Take 1 tablet (75 mg total) by mouth daily.  90 tablet  3  . digoxin (LANOXIN) 0.125 MG tablet Take 0.125 mg by mouth daily.      Marland Kitchen dutasteride (AVODART) 0.5 MG capsule Take 0.5 mg by mouth every evening.      . finasteride (PROSCAR) 5 MG tablet Take 5 mg by mouth daily.      Marland Kitchen ipratropium (ATROVENT) 0.02 % nebulizer solution USE 1 VIAL IN NEBULIZER TWICE A DAY  62.5 mL  5  . levothyroxine (SYNTHROID, LEVOTHROID) 50 MCG tablet Take 50 mcg by mouth daily.      Marland Kitchen LORazepam (ATIVAN) 0.5 MG tablet Take 1 tablet (0.5 mg total) by mouth every 8 (eight) hours as needed. Anxiety  30 tablet  1  . mirabegron ER (MYRBETRIQ) 50 MG TB24 tablet Take 50 mg by mouth daily.      . pantoprazole (PROTONIX) 40 MG tablet Take 40 mg by mouth daily.      . potassium chloride (K-DUR,KLOR-CON) 10 MEQ tablet TAKE 1 TABLET BY MOUTH EVERY DAY  90 tablet  1  . spironolactone (ALDACTONE) 25 MG tablet Take 25 mg by mouth daily.       . Tamsulosin HCl (FLOMAX) 0.4 MG CAPS Take 0.4 mg by mouth daily.      . traMADol (ULTRAM) 50 MG tablet Take 1 tablet (50 mg total) by mouth every 6 (six) hours as needed for pain.  15 tablet  0   No current facility-administered medications for this visit.    LABS/IMAGING: No results found for this or any previous visit (from the past 48 hour(s)). No results found.  VITALS: BP 90/50  Pulse 73  Ht 5\' 9"  (1.753 m)  Wt 118 lb 1.6 oz (53.57 kg)  BMI 17.43 kg/m2  EXAM: General appearance: alert, appears stated age, pale and frail appearing Neck: no carotid bruit and no JVD Lungs: diminished breath sounds bilaterally Heart: irregularly irregular rhythm Abdomen: soft, non-tender; bowel sounds normal; no  masses,  no organomegaly and scaphoid Extremities: extremities normal, atraumatic, no cyanosis or edema Pulses: 2+ and symmetric Skin: Skin color, texture, turgor normal. No rashes or lesions Neurologic: Mental status: Alert, oriented, thought content appropriate, diminished hearing Psych: Mood, affect pleasant  EKG: Atrial fibrillation at 73  ASSESSMENT: 1. Permanent atrial fibrillation-not on warfarin due to to fall risk 2. Coronary artery disease status post multiple stents on Plavix 3. Ischemic cardiomyopathy EF 45-50% 4. History of abdominal aortic aneurysm resection 5. Diarrhea 6. Orthostatic hypotension  PLAN: 1.   Mr. Cappiello is in chronic atrial fibrillation but not warfarin due to fall risk. He's had no angina or problems related to coronary  artery disease and has a mild ischemic cardiomyopathy with prior MI in the past. There is a history of abdominal aortic aneurysm resection, however he has not had ultrasound surveillance of this. He does report several days of diarrhea (without fevers, chills or sweats) which is slowing however his blood pressures notably low today and he does feel somewhat fatigued and dizzy with change of position. I reviewed his medications that indicate he is on higher dose Aldactone and he was previously. Would recommend discontinuing the Aldactone now in the setting of hypertension. He can resume that once the diarrhea has resolved and his blood pressure is improved. In the meantime I recommended Pedialyte or similar oral rehydration to help with his low blood pressure. If he continues to be symptomatic or feels presyncopal or passes out and his blood pressure remains less than 90, he should present to the hospital for intravenous fluids.  Chrystie Nose, MD, [facility] Attending Cardiologist [facility]  Cecile Guevara C 02/10/2013, 11:54 AM

## 2013-02-10 NOTE — Telephone Encounter (Signed)
Returned call and pt verified x 2 w/ Annabelle Harman, caregiver.  Stated they were in the office today and she remembers Dr. Rennis Golden stating to decrease the spironolactone to 12.5 mg.  Stated the papers say to decrease to 25 mg and pt was already taking 25 mg.  Dr. Rennis Golden notified and advised pt was taking 25 mg BID and should hold for a week and then restart at 25 mg daily.  Caregiver informed and verbalized understanding.  Also advised to increase fluid intake, pedialyte and water, as recommended.  Monitor BP a few times a week to make sure trending up and call back if needed.  Caregiver verbalized understanding and agreed w/ plan.

## 2013-02-10 NOTE — Telephone Encounter (Signed)
Pt saw Dr Rennis Golden today-need to verify the milligram of his Spionolactone.Please call today.

## 2013-02-20 ENCOUNTER — Telehealth: Payer: Self-pay | Admitting: Internal Medicine

## 2013-02-20 ENCOUNTER — Encounter (HOSPITAL_COMMUNITY): Payer: Self-pay | Admitting: Emergency Medicine

## 2013-02-20 ENCOUNTER — Emergency Department (HOSPITAL_COMMUNITY): Payer: Medicare Other

## 2013-02-20 ENCOUNTER — Inpatient Hospital Stay (HOSPITAL_COMMUNITY)
Admission: EM | Admit: 2013-02-20 | Discharge: 2013-02-23 | DRG: 190 | Disposition: A | Discharge status: Home Health | Payer: Medicare Other | Attending: Internal Medicine | Admitting: Internal Medicine

## 2013-02-20 DIAGNOSIS — J690 Pneumonitis due to inhalation of food and vomit: Secondary | ICD-10-CM

## 2013-02-20 DIAGNOSIS — I509 Heart failure, unspecified: Secondary | ICD-10-CM

## 2013-02-20 DIAGNOSIS — Z8711 Personal history of peptic ulcer disease: Secondary | ICD-10-CM

## 2013-02-20 DIAGNOSIS — J9611 Chronic respiratory failure with hypoxia: Secondary | ICD-10-CM

## 2013-02-20 DIAGNOSIS — E43 Unspecified severe protein-calorie malnutrition: Secondary | ICD-10-CM | POA: Diagnosis present

## 2013-02-20 DIAGNOSIS — I4891 Unspecified atrial fibrillation: Secondary | ICD-10-CM | POA: Diagnosis present

## 2013-02-20 DIAGNOSIS — Z8673 Personal history of transient ischemic attack (TIA), and cerebral infarction without residual deficits: Secondary | ICD-10-CM

## 2013-02-20 DIAGNOSIS — Z681 Body mass index (BMI) 19 or less, adult: Secondary | ICD-10-CM

## 2013-02-20 DIAGNOSIS — I5033 Acute on chronic diastolic (congestive) heart failure: Secondary | ICD-10-CM

## 2013-02-20 DIAGNOSIS — E039 Hypothyroidism, unspecified: Secondary | ICD-10-CM | POA: Diagnosis present

## 2013-02-20 DIAGNOSIS — I5022 Chronic systolic (congestive) heart failure: Secondary | ICD-10-CM

## 2013-02-20 DIAGNOSIS — Z87891 Personal history of nicotine dependence: Secondary | ICD-10-CM

## 2013-02-20 DIAGNOSIS — M954 Acquired deformity of chest and rib: Secondary | ICD-10-CM

## 2013-02-20 DIAGNOSIS — R1310 Dysphagia, unspecified: Secondary | ICD-10-CM | POA: Diagnosis present

## 2013-02-20 DIAGNOSIS — I2510 Atherosclerotic heart disease of native coronary artery without angina pectoris: Secondary | ICD-10-CM | POA: Diagnosis present

## 2013-02-20 DIAGNOSIS — I739 Peripheral vascular disease, unspecified: Secondary | ICD-10-CM | POA: Diagnosis present

## 2013-02-20 DIAGNOSIS — I10 Essential (primary) hypertension: Secondary | ICD-10-CM | POA: Diagnosis present

## 2013-02-20 DIAGNOSIS — I059 Rheumatic mitral valve disease, unspecified: Secondary | ICD-10-CM

## 2013-02-20 DIAGNOSIS — R531 Weakness: Secondary | ICD-10-CM

## 2013-02-20 DIAGNOSIS — Z8249 Family history of ischemic heart disease and other diseases of the circulatory system: Secondary | ICD-10-CM

## 2013-02-20 DIAGNOSIS — J441 Chronic obstructive pulmonary disease with (acute) exacerbation: Principal | ICD-10-CM | POA: Diagnosis present

## 2013-02-20 DIAGNOSIS — K219 Gastro-esophageal reflux disease without esophagitis: Secondary | ICD-10-CM | POA: Diagnosis present

## 2013-02-20 LAB — URINALYSIS, ROUTINE W REFLEX MICROSCOPIC
Glucose, UA: NEGATIVE mg/dL
Nitrite: NEGATIVE
Protein, ur: NEGATIVE mg/dL
Specific Gravity, Urine: 1.019 (ref 1.005–1.030)
Urobilinogen, UA: 1 mg/dL (ref 0.0–1.0)
pH: 6 (ref 5.0–8.0)

## 2013-02-20 LAB — CBC WITH DIFFERENTIAL/PLATELET
HCT: 34.4 % — ABNORMAL LOW (ref 39.0–52.0)
Hemoglobin: 11.2 g/dL — ABNORMAL LOW (ref 13.0–17.0)
Lymphocytes Relative: 24 % (ref 12–46)
Monocytes Absolute: 0.6 10*3/uL (ref 0.1–1.0)
Monocytes Relative: 13 % — ABNORMAL HIGH (ref 3–12)
Neutro Abs: 2.7 10*3/uL (ref 1.7–7.7)
RBC: 3.9 MIL/uL — ABNORMAL LOW (ref 4.22–5.81)
WBC: 4.5 10*3/uL (ref 4.0–10.5)

## 2013-02-20 LAB — BASIC METABOLIC PANEL
BUN: 17 mg/dL (ref 6–23)
CO2: 31 mEq/L (ref 19–32)
Chloride: 95 mEq/L — ABNORMAL LOW (ref 96–112)
Creatinine, Ser: 0.61 mg/dL (ref 0.50–1.35)

## 2013-02-20 LAB — PRO B NATRIURETIC PEPTIDE: Pro B Natriuretic peptide (BNP): 4269 pg/mL — ABNORMAL HIGH (ref 0–450)

## 2013-02-20 MED ORDER — IPRATROPIUM BROMIDE 0.02 % IN SOLN
0.5000 mg | Freq: Once | RESPIRATORY_TRACT | Status: AC
  Administered 2013-02-20: 0.5 mg via RESPIRATORY_TRACT
  Filled 2013-02-20: qty 2.5

## 2013-02-20 MED ORDER — FUROSEMIDE 10 MG/ML IJ SOLN
40.0000 mg | Freq: Every day | INTRAMUSCULAR | Status: DC
  Administered 2013-02-21 – 2013-02-22 (×2): 40 mg via INTRAVENOUS
  Filled 2013-02-20 (×2): qty 4

## 2013-02-20 MED ORDER — ACETAMINOPHEN 650 MG RE SUPP: 650.0000 mg | Freq: Four times a day (QID) | RECTAL | Status: DC | PRN

## 2013-02-20 MED ORDER — ALBUTEROL SULFATE (5 MG/ML) 0.5% IN NEBU
5.0000 mg | INHALATION_SOLUTION | Freq: Once | RESPIRATORY_TRACT | Status: AC
  Administered 2013-02-20: 5 mg via RESPIRATORY_TRACT
  Filled 2013-02-20: qty 1

## 2013-02-20 MED ORDER — ONDANSETRON HCL 4 MG/2ML IJ SOLN: 4.0000 mg | Freq: Four times a day (QID) | INTRAMUSCULAR | Status: DC | PRN

## 2013-02-20 MED ORDER — ONDANSETRON HCL 4 MG PO TABS: 4.0000 mg | ORAL_TABLET | Freq: Four times a day (QID) | ORAL | Status: DC | PRN

## 2013-02-20 MED ORDER — MORPHINE SULFATE 2 MG/ML IJ SOLN: 2.0000 mg | INTRAMUSCULAR | Status: DC | PRN

## 2013-02-20 MED ORDER — FUROSEMIDE 10 MG/ML IJ SOLN
20.0000 mg | Freq: Once | INTRAMUSCULAR | Status: AC
  Administered 2013-02-20: 20 mg via INTRAVENOUS
  Filled 2013-02-20: qty 4

## 2013-02-20 MED ORDER — ENSURE COMPLETE PO LIQD
237.0000 mL | Freq: Two times a day (BID) | ORAL | Status: DC
  Administered 2013-02-21 – 2013-02-23 (×5): 237 mL via ORAL

## 2013-02-20 MED ORDER — IPRATROPIUM BROMIDE 0.02 % IN SOLN
0.5000 mg | RESPIRATORY_TRACT | Status: DC | PRN
  Administered 2013-02-20 – 2013-02-21 (×4): 0.5 mg via RESPIRATORY_TRACT
  Filled 2013-02-20 (×4): qty 2.5

## 2013-02-20 MED ORDER — HEPARIN SODIUM (PORCINE) 5000 UNIT/ML IJ SOLN
5000.0000 [IU] | Freq: Three times a day (TID) | INTRAMUSCULAR | Status: DC
  Administered 2013-02-20 – 2013-02-23 (×8): 5000 [IU] via SUBCUTANEOUS
  Filled 2013-02-20 (×12): qty 1

## 2013-02-20 MED ORDER — ALBUTEROL SULFATE (5 MG/ML) 0.5% IN NEBU
2.5000 mg | INHALATION_SOLUTION | RESPIRATORY_TRACT | Status: DC | PRN
  Administered 2013-02-20 – 2013-02-21 (×4): 2.5 mg via RESPIRATORY_TRACT
  Filled 2013-02-20 (×4): qty 0.5

## 2013-02-20 MED ORDER — SODIUM CHLORIDE 0.9 % IV SOLN
INTRAVENOUS | Status: AC
  Administered 2013-02-20: 14:00:00 via INTRAVENOUS

## 2013-02-20 MED ORDER — ACETAMINOPHEN 325 MG PO TABS
650.0000 mg | ORAL_TABLET | Freq: Four times a day (QID) | ORAL | Status: DC | PRN
Start: 2013-02-20 — End: 2013-02-23

## 2013-02-20 MED ORDER — POLYETHYLENE GLYCOL 3350 17 G PO PACK
17.0000 g | PACK | Freq: Every day | ORAL | Status: DC | PRN
  Filled 2013-02-20: qty 1

## 2013-02-20 NOTE — ED Notes (Signed)
Respiratory at bedside.

## 2013-02-20 NOTE — Telephone Encounter (Signed)
FYI

## 2013-02-20 NOTE — ED Provider Notes (Signed)
CSN: [id number]     Arrival date & time 02/20/13  1050 History   First MD Initiated Contact with Patient 02/20/13 1054     Chief Complaint  Patient presents with  . Shortness of Breath  . Cough   (Consider location/radiation/quality/duration/timing/severity/associated sxs/prior Treatment) HPI   [name] is a 79 y.o.male with a significant PMH of a.fib, COPD on oxygen 2L He quit smoking more than 60 years ago, CHF, hypertension, stroke,  He has a history of MI in the past and underwent multiple stents which were placed in his coronaries, kyphosis and scoliosis, CAD  presents to the ER with complaints of cough and shortness of breath. He was being taken care of by his home health nurse this morning when she noted that he appeared lethargic and somewhat difficult to understand. The patient tells me he feels fine but has a really bad cough. His rales are audible during HPI. He denies having pain anywhere abd requests a breathing treatment. The symptoms have been progressively getting worse.       Past Medical History  Diagnosis Date  . Abnormality of gait 09/28/2008  . Acquired deformity of chest and rib 01/14/2009  . Atrial fibrillation 09/30/2006  . BENIGN PROSTATIC HYPERTROPHY, WITH URINARY OBSTRUCTION 09/04/2009  . CHRONIC OBSTRUCTIVE PULMONARY DISEASE, ACUTE EXACERBATION 03/24/2007  . Chronic systolic heart failure 08/06/2008  . COPD 09/30/2006  . CORONARY ARTERY DISEASE 10/01/2006    stents placed [facility])  . GERD 04/19/2007  . HYPERTENSION 09/30/2006  . HYPOTHYROIDISM 09/30/2006  . LASSITUDE 06/13/2007  . Osteoarth NOS-Unspec 09/30/2006  . PEPTIC ULCER DISEASE 09/30/2006  . PERIPHERAL VASCULAR DISEASE 09/30/2006  . SPINAL STENOSIS 08/21/2009  . STROKE 08/28/2008  . Kyphoscoliosis    Past Surgical History  Procedure Laterality Date  . Appendectomy    . Hernia repair    . Tonsillectomy    . Abdominal aortic aneurysm repair  [address] [place]  . Transurethral  resection of prostate    . Lumbar laminectomy    . Laryngoscopy and esophagoscopy  02/10/2012    Procedure: LARYNGOSCOPY AND ESOPHAGOSCOPY;  Surgeon: Flo Shanks, MD;  Location: [facility];  Service: ENT;  Laterality: N/A;  DIRECT LARYNGOSCOPY/REMOVAL FOREIGN BODY FROM ESOPHAGUS  . Transthoracic echocardiogram  08/2011    EF 45-50%, mild conc LVH; LA severely dilated; RA mod dilated; mod calcif of MV; trace MR; mild TR, RVSP elevated; AV mod sclerotic; mild-mod valvular AS & mild regurg; trace pulm valve regurg  . Elbow surgery  2011  . Cardiac catheterization  2005    3.5x23 Cypher stent to prox & ostial RCA   Family History  Problem Relation Age of Onset  . Heart attack Mother   . Tuberculosis Father   . Heart Problems Maternal Grandmother   . Other Maternal Grandfather     stomach problems  . Heart Problems Sister     x2  . Meniere's disease Daughter    History  Substance Use Topics  . Smoking status: Former Smoker -- 0.80 packs/day for 20 years    Types: Cigarettes    Quit date: 04/06/1948  . Smokeless tobacco: Never Used  . Alcohol Use: No    Review of Systems The patient denies anorexia, fever, weight loss,, vision loss, decreased hearing, hoarseness, chest pain, syncope, dyspnea on exertion, peripheral edema, balance deficits, hemoptysis, abdominal pain, melena, hematochezia, severe indigestion/heartburn, hematuria, incontinence, genital sores, muscle weakness, suspicious skin lesions, transient blindness, difficulty walking, depression, unusual weight  change, abnormal bleeding, enlarged lymph nodes, angioedema, and breast masses.   Allergies  Bleph-10 and Procaine hcl  Home Medications   Current Outpatient Rx  Name  Route  Sig  Dispense  Refill  . albuterol (PROVENTIL) (2.5 MG/3ML) 0.083% nebulizer solution   Nebulization   Take 3 mL (2.5 mg total) by nebulization every 6 (six) hours as needed for wheezing.   75 mL   12   . budesonide-formoterol (SYMBICORT) 160-4.5  MCG/ACT inhaler   Inhalation   Inhale 2 puffs into the lungs 2 (two) times daily.         . carvedilol (COREG) 12.5 MG tablet   Oral   Take 12.5 mg by mouth 2 (two) times daily with a meal.         . clopidogrel (PLAVIX) 75 MG tablet   Oral   Take 1 tablet (75 mg total) by mouth daily.   90 tablet   3   . digoxin (LANOXIN) 0.125 MG tablet   Oral   Take 0.125 mg by mouth daily.         Marland Kitchen dutasteride (AVODART) 0.5 MG capsule   Oral   Take 0.5 mg by mouth at bedtime.          . finasteride (PROSCAR) 5 MG tablet   Oral   Take 5 mg by mouth daily.         Marland Kitchen ipratropium (ATROVENT) 0.02 % nebulizer solution   Nebulization   Take 0.5 mg by nebulization 2 (two) times daily.         Marland Kitchen levothyroxine (SYNTHROID, LEVOTHROID) 50 MCG tablet   Oral   Take 50 mcg by mouth daily.         Marland Kitchen LORazepam (ATIVAN) 0.5 MG tablet   Oral   Take 1 tablet (0.5 mg total) by mouth every 8 (eight) hours as needed. Anxiety   30 tablet   1   . mirabegron ER (MYRBETRIQ) 50 MG TB24 tablet   Oral   Take 50 mg by mouth daily.         . pantoprazole (PROTONIX) 40 MG tablet   Oral   Take 40 mg by mouth daily.         . potassium chloride (K-DUR,KLOR-CON) 10 MEQ tablet   Oral   Take 10 mEq by mouth 2 (two) times daily.         Marland Kitchen spironolactone (ALDACTONE) 25 MG tablet   Oral   Take 25 mg by mouth daily.          . Tamsulosin HCl (FLOMAX) 0.4 MG CAPS   Oral   Take 0.4 mg by mouth daily.         . traMADol (ULTRAM) 50 MG tablet   Oral   Take 1 tablet (50 mg total) by mouth every 6 (six) hours as needed for pain.   15 tablet   0    BP 121/65  Pulse 72  Temp(Src) 98.3 F (36.8 C) (Oral)  Resp 22  SpO2 96% Physical Exam  Nursing note and vitals reviewed. Constitutional: He appears well-developed and well-nourished. No distress.  HENT:  Head: Normocephalic and atraumatic.  Eyes: Pupils are equal, round, and reactive to light.  Neck: Normal range of motion. Neck  supple.  Cardiovascular: Normal rate and regular rhythm.   Pulmonary/Chest: Effort normal. He has wheezes. He has rales.  Abdominal: Soft.  Neurological: He is alert.  Skin: Skin is warm and dry.    ED Course  Procedures (including critical care time) Labs Review Labs Reviewed  CBC WITH DIFFERENTIAL - Abnormal; Notable for the following:    RBC 3.90 (*)    Hemoglobin 11.2 (*)    HCT 34.4 (*)    Platelets 145 (*)    Monocytes Relative 13 (*)    All other components within normal limits  BASIC METABOLIC PANEL - Abnormal; Notable for the following:    Sodium 132 (*)    Chloride 95 (*)    GFR calc non Af Amer 84 (*)    All other components within normal limits  PRO B NATRIURETIC PEPTIDE - Abnormal; Notable for the following:    Pro B Natriuretic peptide (BNP) 4269.0 (*)    All other components within normal limits  URINALYSIS, ROUTINE W REFLEX MICROSCOPIC   Imaging Review Dg Chest 2 View  02/20/2013   CLINICAL DATA:  Cough, wheezing, mucus production  EXAM: CHEST  2 VIEW  COMPARISON:  02/14/2012  FINDINGS: There is diffuse prominence of the interstitial markings, indistinctness of the pulmonary vasculature, and evidence of peribronchial cuffing. Diffuse bilateral pulmonary opacities are appreciated. A consolidated density projects within the left lung base with the meniscal apex. The cardiac silhouette is enlarged. The aorta is tortuous and ectatic. Atherosclerotic calcifications are identified within the aorta. There is diffuse osteopenia. Rib fractures identified involving the lateral aspects of the 3rd and 4th ribs on the left age indeterminate.  IMPRESSION: 1. Interstitial infiltrate likely reflecting pulmonary edema. An infectious or inflammatory infiltrate clinically appropriate not be excluded. 2. Left pleural effusion which is increased in conspicuity 3. Lateral rib fractures on the left there is no evidence of a pneumothorax.   Electronically Signed   By: Salome Holmes M.D.    On: 02/20/2013 12:18    EKG Interpretation   None       MDM   1. Acute on chronic diastolic CHF (congestive heart failure)      Patients lab results show that he has pulmonary edema and left pleural effusion. Also noted are two rib fractures indeterminate age. Patient given breathing treatment and 20mg  IV lasix. Pt discussed with attending Dr. Cleora Fleet, [facility], triad, Team 8, med-surg, inpatient, Dr. Ethelene Browns, PA-C 02/20/13 1255

## 2013-02-20 NOTE — Telephone Encounter (Signed)
Patient Information:  Caller Name: Lanora Manis  Phone: [phone]  Patient: Refujio, Haymer  Gender: Male  DOB: Apr 10, 1917  Age: 79 Years  PCP: Eleonore Chiquito (Family Practice > 56yrs old)  Office Follow Up:  Does the office need to follow up with this patient?: No  Instructions For The Office: N/A  RN Note:  Palpable congestion in lungs.  Reports noticeable confusion present; asking same questions over and over.  Unaware what day it is.  Agreed to call 911 now.  Symptoms  Reason For Call & Symptoms: Emergent Call: Caregiver reporting disorientation with cough, wheezing and congestion.  Appears pale.  Has not slept for 2 nights.   Reviewed Health History In EMR: Yes  Reviewed Medications In EMR: Yes  Reviewed Allergies In EMR: Yes  Reviewed Surgeries / Procedures: Yes  Date of Onset of Symptoms: 02/20/2013  Treatments Tried: Albuterol Neb  Treatments Tried Worked: No  Guideline(s) Used:  Breathing Difficulty  Disposition Per Guideline:   Call EMS 911 Now  Reason For Disposition Reached:   Difficult to awaken or acting confused (e.g., disoriented, slurred speech)  Advice Given:  N/A  Patient Will Follow Care Advice:  YES

## 2013-02-20 NOTE — Progress Notes (Signed)
   CARE MANAGEMENT ED NOTE 02/20/2013  Patient:  [name], [name]   Account Number:  [id number]  Date Initiated:  02/20/2013  Documentation initiated by:  Edd Arbour  Subjective/Objective Assessment:   79 yr old medicare/ c/o sob Dx CHF admitted to 5 E See H&P  pcp kwiatkowski-     Subjective/Objective Assessment Detail:   CM consult indicated for 24 hr pcs services and home health -[facility]  FYI-Patient has 24/7 care through [facility].  Him and his wife  (who has Alzheimer's) receive the care.  The patient has also been getting physical therapy through [facility]  Pt to be followed for discharge needs     Action/Plan:   ur completed CM spoke with Cleda Mccreedy via cell to inform her of pt admission for CHF   Action/Plan Detail:   Anticipated DC Date:  02/23/2013     Status Recommendation to Physician:   Result of Recommendation:    Other ED Services  Consult Working Plan    DC Planning Services  Other    Choice offered to / List presented to:       [facility] arranged  HH-2 PT      HH agency  [facility]    Status of service:  Completed, signed off  ED Comments:   ED Comments Detail:

## 2013-02-20 NOTE — ED Notes (Signed)
Pt from home via [facility] c/o shortness of breath and congested cough  For a couple a days.  Pt has home health RN and she reccommended that he come today. PT on 2 L at home and was increased to 4 L with EMS.

## 2013-02-20 NOTE — ED Notes (Signed)
Echo at bedside

## 2013-02-20 NOTE — ED Notes (Signed)
Respiratory notified of breathing treatment.

## 2013-02-20 NOTE — H&P (Signed)
[facility] History and Physical  PAX REASONER JXB:[id number] DOB: June 20, 1917 DOA: 02/20/2013  Referring physician: Emergency Department PCP: Rogelia Boga, MD  Specialists:   Chief Complaint: SOB  HPI: [name] is a 79 y.o. male  With a hx of chronic systolic CHF, COPD, hypothyroid, and htn who presents to the ED with complaints of progressive SOB. Per caregiver at bedside, pt's diuretics were recently decreased 2-3 weeks prior. In the ED, the patient was noted to have a presenting BNP of over 4,000 with cxr findings suggestive of interstitial edema. The patient was given a dose of IV lasix with a breathing tx and the hospitalist was consulted for admission.  Review of Systems: Per above, the remainder of the 10pt ros reviewed and are neg  Past Medical History  Diagnosis Date  . Abnormality of gait 09/28/2008  . Acquired deformity of chest and rib 01/14/2009  . Atrial fibrillation 09/30/2006  . BENIGN PROSTATIC HYPERTROPHY, WITH URINARY OBSTRUCTION 09/04/2009  . CHRONIC OBSTRUCTIVE PULMONARY DISEASE, ACUTE EXACERBATION 03/24/2007  . Chronic systolic heart failure 08/06/2008  . COPD 09/30/2006  . CORONARY ARTERY DISEASE 10/01/2006    stents placed [facility])  . GERD 04/19/2007  . HYPERTENSION 09/30/2006  . HYPOTHYROIDISM 09/30/2006  . LASSITUDE 06/13/2007  . Osteoarth NOS-Unspec 09/30/2006  . PEPTIC ULCER DISEASE 09/30/2006  . PERIPHERAL VASCULAR DISEASE 09/30/2006  . SPINAL STENOSIS 08/21/2009  . STROKE 08/28/2008  . Kyphoscoliosis    Past Surgical History  Procedure Laterality Date  . Appendectomy    . Hernia repair    . Tonsillectomy    . Abdominal aortic aneurysm repair  [address] [place]  . Transurethral resection of prostate    . Lumbar laminectomy    . Laryngoscopy and esophagoscopy  02/10/2012    Procedure: LARYNGOSCOPY AND ESOPHAGOSCOPY;  Surgeon: Flo Shanks, MD;  Location: [facility];  Service: ENT;  Laterality: N/A;  DIRECT  LARYNGOSCOPY/REMOVAL FOREIGN BODY FROM ESOPHAGUS  . Transthoracic echocardiogram  08/2011    EF 45-50%, mild conc LVH; LA severely dilated; RA mod dilated; mod calcif of MV; trace MR; mild TR, RVSP elevated; AV mod sclerotic; mild-mod valvular AS & mild regurg; trace pulm valve regurg  . Elbow surgery  2011  . Cardiac catheterization  2005    3.5x23 Cypher stent to prox & ostial RCA   Social History:  reports that he quit smoking about 64 years ago. His smoking use included Cigarettes. He has a 16 pack-year smoking history. He has never used smokeless tobacco. He reports that he does not drink alcohol or use illicit drugs.  where does patient live--home, ALF, SNF? and with whom if at home?  Can patient participate in ADLs?  Allergies  Allergen Reactions  . Bleph-10 [Sulfacetamide Sodium] Other (See Comments)    Unknown   . Procaine Hcl     REACTION: syncope    Family History  Problem Relation Age of Onset  . Heart attack Mother   . Tuberculosis Father   . Heart Problems Maternal Grandmother   . Other Maternal Grandfather     stomach problems  . Heart Problems Sister     x2  . Meniere's disease Daughter     (be sure to complete)  Prior to Admission medications   Medication Sig Start Date End Date Taking? Authorizing Provider  albuterol (PROVENTIL) (2.5 MG/3ML) 0.083% nebulizer solution Take 3 mL (2.5 mg total) by nebulization every 6 (six) hours as needed for wheezing. 11/22/12  Yes  Gordy Savers, MD  budesonide-formoterol [facility]) 160-4.5 MCG/ACT inhaler Inhale 2 puffs into the lungs 2 (two) times daily.   Yes Historical Provider, MD  carvedilol (COREG) 12.5 MG tablet Take 12.5 mg by mouth 2 (two) times daily with a meal.   Yes Historical Provider, MD  clopidogrel (PLAVIX) 75 MG tablet Take 1 tablet (75 mg total) by mouth daily. 06/15/12  Yes Gordy Savers, MD  digoxin (LANOXIN) 0.125 MG tablet Take 0.125 mg by mouth daily.   Yes Historical Provider, MD  dutasteride  (AVODART) 0.5 MG capsule Take 0.5 mg by mouth at bedtime.  07/13/11  Yes Quandre Art, DO  finasteride (PROSCAR) 5 MG tablet Take 5 mg by mouth daily.   Yes Historical Provider, MD  ipratropium (ATROVENT) 0.02 % nebulizer solution Take 0.5 mg by nebulization 2 (two) times daily.   Yes Historical Provider, MD  levothyroxine (SYNTHROID, LEVOTHROID) 50 MCG tablet Take 50 mcg by mouth daily.   Yes Historical Provider, MD  LORazepam (ATIVAN) 0.5 MG tablet Take 1 tablet (0.5 mg total) by mouth every 8 (eight) hours as needed. Anxiety 10/28/12  Yes Gordy Savers, MD  mirabegron ER (MYRBETRIQ) 50 MG TB24 tablet Take 50 mg by mouth daily.   Yes Historical Provider, MD  pantoprazole (PROTONIX) 40 MG tablet Take 40 mg by mouth daily.   Yes Historical Provider, MD  potassium chloride (K-DUR,KLOR-CON) 10 MEQ tablet Take 10 mEq by mouth 2 (two) times daily.   Yes Historical Provider, MD  spironolactone (ALDACTONE) 25 MG tablet Take 25 mg by mouth daily.    Yes Historical Provider, MD  Tamsulosin HCl (FLOMAX) 0.4 MG CAPS Take 0.4 mg by mouth daily.   Yes Historical Provider, MD  traMADol (ULTRAM) 50 MG tablet Take 1 tablet (50 mg total) by mouth every 6 (six) hours as needed for pain. 07/18/12  Yes Benny Lennert, MD   Physical Exam: Filed Vitals:   02/20/13 1054 02/20/13 1100 02/20/13 1137  BP:  121/65   Pulse:  72   Temp:  98.3 F (36.8 C)   TempSrc:  Oral   Resp:  22   SpO2: 92% 100% 96%     General:  Awake, in nad  Eyes: PERRL B  ENT: Membranes moist, dentition   Neck: Trachea midline, neck supple  Cardiovascular: regular, s1, s2  Respiratory: normal resp effort, no wheezing  Abdomen: soft, nondistended  Skin: no abnormal skin lesions seen, normal skin turgor  Musculoskeletal: perfused, no clubbing  Psychiatric: mood/affect normal // no auditory/visual hallucinations  Neurologic: cn2-12 grossly intact, strength/sensation intact  Labs on Admission:  Basic Metabolic  Panel:  Recent Labs Lab 02/20/13 1145  NA 132*  K 4.0  CL 95*  CO2 31  GLUCOSE 95  BUN 17  CREATININE 0.61  CALCIUM 8.9   Liver Function Tests: No results found for this basename: AST, ALT, ALKPHOS, BILITOT, PROT, ALBUMIN,  in the last 168 hours No results found for this basename: LIPASE, AMYLASE,  in the last 168 hours No results found for this basename: AMMONIA,  in the last 168 hours CBC:  Recent Labs Lab 02/20/13 1145  WBC 4.5  NEUTROABS 2.7  HGB 11.2*  HCT 34.4*  MCV 88.2  PLT 145*   Cardiac Enzymes: No results found for this basename: CKTOTAL, CKMB, CKMBINDEX, TROPONINI,  in the last 168 hours  BNP (last 3 results)  Recent Labs  02/20/13 1145  PROBNP 4269.0*   CBG: No results found for this  basename: GLUCAP,  in the last 168 hours  Radiological Exams on Admission: Dg Chest 2 View  02/20/2013   CLINICAL DATA:  Cough, wheezing, mucus production  EXAM: CHEST  2 VIEW  COMPARISON:  02/14/2012  FINDINGS: There is diffuse prominence of the interstitial markings, indistinctness of the pulmonary vasculature, and evidence of peribronchial cuffing. Diffuse bilateral pulmonary opacities are appreciated. A consolidated density projects within the left lung base with the meniscal apex. The cardiac silhouette is enlarged. The aorta is tortuous and ectatic. Atherosclerotic calcifications are identified within the aorta. There is diffuse osteopenia. Rib fractures identified involving the lateral aspects of the 3rd and 4th ribs on the left age indeterminate.  IMPRESSION: 1. Interstitial infiltrate likely reflecting pulmonary edema. An infectious or inflammatory infiltrate clinically appropriate not be excluded. 2. Left pleural effusion which is increased in conspicuity 3. Lateral rib fractures on the left there is no evidence of a pneumothorax.   Electronically Signed   By: Salome Holmes M.D.   On: 02/20/2013 12:18    EKG: Independently reviewed. afib  Assessment/Plan Active  Problems:   HYPOTHYROIDISM   HYPERTENSION   CORONARY ARTERY DISEASE   Campath-induced atrial fibrillation   Chronic systolic heart failure   PERIPHERAL VASCULAR DISEASE   CHRONIC OBSTRUCTIVE PULMONARY DISEASE, ACUTE EXACERBATION   1. SOB with suspected acutely decompensated systolic CHF 1. EF of over 4,000 in ED 2. Will cont on scheduled IV lasix 3. Monitor I/O, daily weights, and lytes closely 4. Will check TSH 5. Will admit to med-surg bed, inpt 2. HTN 1. Stable 2. Cont meds for now 3. Hypothyroid 1. Cont home meds 2. Will check TSH 4. Hx CAD 1. Stable 2. Cont home meds, including antiplatelet 5. COPD 1. Stable 2. Cont on PRN breathing treatment 6. DVT prophylaxis 1. Heparin subQ  Code Status: Full (Pt states "do whatever you need to do" when asked) Family Communication: Pt in room  Disposition Plan: Pending (indicate anticipated LOS)  Time spent:  Anzlee Hinesley K [facility] Pager [phone]  If 7PM-7AM, please contact night-coverage [email or web address] Password [facility] 02/20/2013, 12:58 PM

## 2013-02-20 NOTE — ED Notes (Signed)
Patient transported to X-ray 

## 2013-02-20 NOTE — ED Notes (Signed)
Echocardiogram tech at bedside.

## 2013-02-20 NOTE — Progress Notes (Signed)
INITIAL NUTRITION ASSESSMENT  Pt meets criteria for severe MALNUTRITION in the context of chronic illness as evidenced by severe muscle wasting and subcutaneous fat loss in temporal/orbital, clavicles, upper arm, and hand region.  DOCUMENTATION CODES Per approved criteria  -Severe malnutrition in the context of chronic illness -Underweight   INTERVENTION: - Ensure Complete BID - Will continue to monitor   NUTRITION DIAGNOSIS: Increased nutrient needs related to underweight as evidenced by BMI of 17 kg/(m^2).   Goal: Pt to consume >90% of meals/supplements  Monitor:  Weights, labs, intake  Reason for Assessment: Nutrition risk   79 y.o. male  Admitting Dx: Shortness of breath   ASSESSMENT: Pt with hx of chronic systolic CHF, COPD, hypothyroidism, and HTN who presents to the ED with complaints of progressive SOB. Met with pt who reports eating well at home, 3 meals/day which caregiver prepares. Denies being on any nutritional supplements. Reports weight has been stable. Reports he does not use salt at home and follows low sodium diet. Reports his meals are well balanced.   Nutrition Focused Physical Exam:  Subcutaneous Fat:  Orbital Region: severe wasting Upper Arm Region: severe wasting Thoracic and Lumbar Region: severe wasting  Muscle:  Temple Region: severe wasting Clavicle Bone Region: severe wasting Clavicle and Acromion Bone Region: severe wasting Scapular Bone Region: NA Dorsal Hand: mild/moderate wasting Patellar Region: mild/moderate wasting Anterior Thigh Region: mild/moderate wasting Posterior Calf Region: mild/moderate wasting  Edema: None noted   Height: Ht Readings from Last 1 Encounters:  02/10/13 5\' 9"  (1.753 m)    Weight: Wt Readings from Last 1 Encounters:  02/20/13 119 lb 14.9 oz (54.4 kg)    Ideal Body Weight: 160 lb   % Ideal Body Weight: 74%  Wt Readings from Last 10 Encounters:  02/20/13 119 lb 14.9 oz (54.4 kg)  02/10/13 118 lb  1.6 oz (53.57 kg)  01/25/13 121 lb (54.885 kg)  11/22/12 121 lb (54.885 kg)  10/28/12 123 lb (55.792 kg)  07/18/12 115 lb (52.164 kg)  07/13/12 122 lb (55.339 kg)  06/21/12 119 lb (53.978 kg)  03/29/12 119 lb (53.978 kg)  02/18/12 116 lb (52.617 kg)    Usual Body Weight: 119 lb per pt  % Usual Body Weight: 100%  BMI:  Body mass index is 17.7 kg/(m^2). Underweight  Estimated Nutritional Needs: Kcal: 1650-1850 Protein: 65-80g Fluid: 1.6-1.8L/day  Skin: Intact   Diet Order: Cardiac  EDUCATION NEEDS: -No education needs identified at this time  No intake or output data in the 24 hours ending 02/20/13 1625  Last BM: PTA  Labs:   Recent Labs Lab 02/20/13 1145  NA 132*  K 4.0  CL 95*  CO2 31  BUN 17  CREATININE 0.61  CALCIUM 8.9  GLUCOSE 95    CBG (last 3)  No results found for this basename: GLUCAP,  in the last 72 hours  Scheduled Meds: . sodium chloride   Intravenous STAT  . [START ON 02/21/2013] furosemide  40 mg Intravenous Daily  . heparin  5,000 Units Subcutaneous Q8H    Continuous Infusions:   Past Medical History  Diagnosis Date  . Abnormality of gait 09/28/2008  . Acquired deformity of chest and rib 01/14/2009  . Atrial fibrillation 09/30/2006  . BENIGN PROSTATIC HYPERTROPHY, WITH URINARY OBSTRUCTION 09/04/2009  . CHRONIC OBSTRUCTIVE PULMONARY DISEASE, ACUTE EXACERBATION 03/24/2007  . Chronic systolic heart failure 08/06/2008  . COPD 09/30/2006  . CORONARY ARTERY DISEASE 10/01/2006    stents placed [facility])  . GERD  04/19/2007  . HYPERTENSION 09/30/2006  . HYPOTHYROIDISM 09/30/2006  . LASSITUDE 06/13/2007  . Osteoarth NOS-Unspec 09/30/2006  . PEPTIC ULCER DISEASE 09/30/2006  . PERIPHERAL VASCULAR DISEASE 09/30/2006  . SPINAL STENOSIS 08/21/2009  . STROKE 08/28/2008  . Kyphoscoliosis     Past Surgical History  Procedure Laterality Date  . Appendectomy    . Hernia repair    . Tonsillectomy    . Abdominal aortic aneurysm repair  [address], [place]  . Transurethral resection of prostate    . Lumbar laminectomy    . Laryngoscopy and esophagoscopy  02/10/2012    Procedure: LARYNGOSCOPY AND ESOPHAGOSCOPY;  Surgeon: Flo Shanks, MD;  Location: [facility];  Service: ENT;  Laterality: N/A;  DIRECT LARYNGOSCOPY/REMOVAL FOREIGN BODY FROM ESOPHAGUS  . Transthoracic echocardiogram  08/2011    EF 45-50%, mild conc LVH; LA severely dilated; RA mod dilated; mod calcif of MV; trace MR; mild TR, RVSP elevated; AV mod sclerotic; mild-mod valvular AS & mild regurg; trace pulm valve regurg  . Elbow surgery  2011  . Cardiac catheterization  2005    3.5x23 Cypher stent to prox & ostial RCA    Levon Hedger MS, RD, LDN [phone] Pager [phone] After Hours Pager

## 2013-02-20 NOTE — Progress Notes (Signed)
Echocardiogram 2D Echocardiogram has been performed.  [name] 02/20/2013, 2:54 PM

## 2013-02-21 DIAGNOSIS — M954 Acquired deformity of chest and rib: Secondary | ICD-10-CM

## 2013-02-21 DIAGNOSIS — I5022 Chronic systolic (congestive) heart failure: Secondary | ICD-10-CM

## 2013-02-21 LAB — CBC
HCT: 33.2 % — ABNORMAL LOW (ref 39.0–52.0)
Hemoglobin: 10.9 g/dL — ABNORMAL LOW (ref 13.0–17.0)
MCH: 28.8 pg (ref 26.0–34.0)
MCHC: 32.8 g/dL (ref 30.0–36.0)
MCV: 87.6 fL (ref 78.0–100.0)
Platelets: 156 10*3/uL (ref 150–400)
RDW: 13.5 % (ref 11.5–15.5)
WBC: 4.4 10*3/uL (ref 4.0–10.5)

## 2013-02-21 LAB — COMPREHENSIVE METABOLIC PANEL
ALT: 8 U/L (ref 0–53)
Albumin: 2.3 g/dL — ABNORMAL LOW (ref 3.5–5.2)
Alkaline Phosphatase: 67 U/L (ref 39–117)
BUN: 19 mg/dL (ref 6–23)
Chloride: 93 mEq/L — ABNORMAL LOW (ref 96–112)
Potassium: 3.8 mEq/L (ref 3.5–5.1)
Total Bilirubin: 0.5 mg/dL (ref 0.3–1.2)
Total Protein: 5.5 g/dL — ABNORMAL LOW (ref 6.0–8.3)

## 2013-02-21 MED ORDER — ALBUTEROL SULFATE (5 MG/ML) 0.5% IN NEBU: 2.5000 mg | INHALATION_SOLUTION | Freq: Four times a day (QID) | RESPIRATORY_TRACT | Status: DC

## 2013-02-21 MED ORDER — ALBUTEROL SULFATE (5 MG/ML) 0.5% IN NEBU: 2.5000 mg | INHALATION_SOLUTION | RESPIRATORY_TRACT | Status: DC | PRN

## 2013-02-21 MED ORDER — IPRATROPIUM BROMIDE 0.02 % IN SOLN
0.5000 mg | RESPIRATORY_TRACT | Status: DC | PRN
  Administered 2013-02-21: 0.5 mg via RESPIRATORY_TRACT
  Filled 2013-02-21: qty 2.5

## 2013-02-21 MED ORDER — IPRATROPIUM BROMIDE 0.02 % IN SOLN: 0.5000 mg | RESPIRATORY_TRACT | Status: DC | PRN

## 2013-02-21 MED ORDER — ALBUTEROL SULFATE (5 MG/ML) 0.5% IN NEBU
2.5000 mg | INHALATION_SOLUTION | Freq: Four times a day (QID) | RESPIRATORY_TRACT | Status: DC
  Administered 2013-02-21 – 2013-02-23 (×7): 2.5 mg via RESPIRATORY_TRACT
  Filled 2013-02-21 (×7): qty 0.5

## 2013-02-21 NOTE — Progress Notes (Signed)
[facility] PROGRESS NOTE  RUE TINNEL OZH:[id number] DOB: 01-21-1918 DOA: 02/20/2013 PCP: Rogelia Boga, MD  Assessment/Plan: 1. SOB with suspected acutely decompensated systolic CHF  1. EF of over 4,000 in ED 2. Will cont on scheduled IV lasix 3. Monitor I/O, daily weights, and lytes closely 4. TSH normal 5. Net neg 1.2L overnight 6. Still with coarse breath sounds 7. Would cont diuresis for now 2. HTN  1. Stable 2. Cont meds for now 3. Hypothyroid  1. Cont home meds 2. TSH normal 4. Hx CAD  1. Stable 2. Cont home meds, including antiplatelet 5. COPD  1. Stable 2. Cont on PRN breathing treatment 6. DVT prophylaxis  1. Heparin subQ  Code Status: Full Family Communication: Pt in room (indicate person spoken with, relationship, and if by phone, the number) Disposition Plan: Pending  HPI/Subjective: No acute events.   Objective: Filed Vitals:   02/21/13 0228 02/21/13 0600 02/21/13 0854 02/21/13 0855  BP:  113/63    Pulse:  74    Temp:  98 F (36.7 C)    TempSrc:  Oral    Resp:  18    Height:      Weight:  54.7 kg (120 lb 9.5 oz)    SpO2: 94% 93% 89% 91%    Intake/Output Summary (Last 24 hours) at 02/21/13 0950 Last data filed at 02/21/13 0630  Gross per 24 hour  Intake    440 ml  Output   1725 ml  Net  -1285 ml   Filed Weights   02/20/13 1530 02/21/13 0600  Weight: 54.4 kg (119 lb 14.9 oz) 54.7 kg (120 lb 9.5 oz)    Exam:   General:  Awake, in nad  Cardiovascular: regular, s1, s2  Respiratory: normal resp effort, coarse breath sounds  Abdomen: soft, nondistended  Musculoskeletal: perfused, no clubbing   Data Reviewed: Basic Metabolic Panel:  Recent Labs Lab 02/20/13 1145 02/21/13 0507  NA 132* 131*  K 4.0 3.8  CL 95* 93*  CO2 31 33*  GLUCOSE 95 93  BUN 17 19  CREATININE 0.61 0.59  CALCIUM 8.9 8.7   Liver Function Tests:  Recent Labs Lab 02/21/13 0507  AST 14  ALT 8  ALKPHOS 67  BILITOT 0.5   PROT 5.5*  ALBUMIN 2.3*   No results found for this basename: LIPASE, AMYLASE,  in the last 168 hours No results found for this basename: AMMONIA,  in the last 168 hours CBC:  Recent Labs Lab 02/20/13 1145 02/21/13 0507  WBC 4.5 4.4  NEUTROABS 2.7  --   HGB 11.2* 10.9*  HCT 34.4* 33.2*  MCV 88.2 87.6  PLT 145* 156   Cardiac Enzymes: No results found for this basename: CKTOTAL, CKMB, CKMBINDEX, TROPONINI,  in the last 168 hours BNP (last 3 results)  Recent Labs  02/20/13 1145  PROBNP 4269.0*   CBG: No results found for this basename: GLUCAP,  in the last 168 hours  No results found for this or any previous visit (from the past 240 hour(s)).   Studies: Dg Chest 2 View  02/20/2013   CLINICAL DATA:  Cough, wheezing, mucus production  EXAM: CHEST  2 VIEW  COMPARISON:  02/14/2012  FINDINGS: There is diffuse prominence of the interstitial markings, indistinctness of the pulmonary vasculature, and evidence of peribronchial cuffing. Diffuse bilateral pulmonary opacities are appreciated. A consolidated density projects within the left lung base with the meniscal apex. The cardiac silhouette is enlarged. The aorta is tortuous and ectatic. Atherosclerotic  calcifications are identified within the aorta. There is diffuse osteopenia. Rib fractures identified involving the lateral aspects of the 3rd and 4th ribs on the left age indeterminate.  IMPRESSION: 1. Interstitial infiltrate likely reflecting pulmonary edema. An infectious or inflammatory infiltrate clinically appropriate not be excluded. 2. Left pleural effusion which is increased in conspicuity 3. Lateral rib fractures on the left there is no evidence of a pneumothorax.   Electronically Signed   By: Salome Holmes M.D.   On: 02/20/2013 12:18    Scheduled Meds: . feeding supplement (ENSURE COMPLETE)  237 mL Oral BID BM  . furosemide  40 mg Intravenous Daily  . heparin  5,000 Units Subcutaneous Q8H   Continuous Infusions:    Active Problems:   HYPOTHYROIDISM   HYPERTENSION   CORONARY ARTERY DISEASE   Campath-induced atrial fibrillation   Chronic systolic heart failure   PERIPHERAL VASCULAR DISEASE   CHRONIC OBSTRUCTIVE PULMONARY DISEASE, ACUTE EXACERBATION   Protein-calorie malnutrition, severe  Time spent:  Charmagne Buhl K  [facility] Pager [phone]. If 7PM-7AM, please contact night-coverage at [email or web address], password [facility] 02/21/2013, 9:50 AM  LOS: 1 day

## 2013-02-21 NOTE — ED Provider Notes (Signed)
Medical screening examination/treatment/procedure(s) were performed by non-physician practitioner and as supervising physician I was immediately available for consultation/collaboration.  EKG Interpretation    Date/Time:  Monday February 20 2013 13:16:28 EST Ventricular Rate:  79 PR Interval:    QRS Duration: 92 QT Interval:  446 QTC Calculation: 511 R Axis:   -59 Text Interpretation:  Atrial fibrillation Left anterior fascicular block Anterior infarct, age indeterminate Prolonged QT interval             Juliet Rude. Rubin Payor, MD 02/21/13 [phone]

## 2013-02-22 DIAGNOSIS — R1310 Dysphagia, unspecified: Secondary | ICD-10-CM

## 2013-02-22 DIAGNOSIS — E039 Hypothyroidism, unspecified: Secondary | ICD-10-CM

## 2013-02-22 DIAGNOSIS — R5381 Other malaise: Secondary | ICD-10-CM

## 2013-02-22 DIAGNOSIS — I10 Essential (primary) hypertension: Secondary | ICD-10-CM

## 2013-02-22 LAB — COMPREHENSIVE METABOLIC PANEL
AST: 13 U/L (ref 0–37)
Albumin: 2.4 g/dL — ABNORMAL LOW (ref 3.5–5.2)
Alkaline Phosphatase: 74 U/L (ref 39–117)
Calcium: 8.7 mg/dL (ref 8.4–10.5)
Chloride: 91 mEq/L — ABNORMAL LOW (ref 96–112)
Creatinine, Ser: 0.59 mg/dL (ref 0.50–1.35)
GFR calc Af Amer: >90 mL/min (ref >90)
Glucose, Bld: 104 mg/dL — ABNORMAL HIGH (ref 70–99)
Potassium: 3.7 mEq/L (ref 3.5–5.1)
Total Protein: 5.7 g/dL — ABNORMAL LOW (ref 6.0–8.3)

## 2013-02-22 LAB — PRO B NATRIURETIC PEPTIDE: Pro B Natriuretic peptide (BNP): 2609 pg/mL — ABNORMAL HIGH (ref 0–450)

## 2013-02-22 MED ORDER — RESOURCE THICKENUP CLEAR PO POWD
ORAL | Status: DC | PRN
  Filled 2013-02-22: qty 125

## 2013-02-22 MED ORDER — FUROSEMIDE 10 MG/ML IJ SOLN
40.0000 mg | Freq: Three times a day (TID) | INTRAMUSCULAR | Status: DC
  Administered 2013-02-22 – 2013-02-23 (×2): 40 mg via INTRAVENOUS
  Filled 2013-02-22 (×5): qty 4

## 2013-02-22 NOTE — Progress Notes (Deleted)
Patient and spouse informed per Dr. Hardie Shackleton instructions to inform that the abcess is larger than previous exam and that she would be discussing with the surgical team to determine next step in plan of care.

## 2013-02-22 NOTE — Progress Notes (Signed)
[facility] PROGRESS NOTE  EDVARDO HONSE ZOX:[id number] DOB: 07-Mar-1918 DOA: 02/20/2013 PCP: Rogelia Boga, MD  Assessment/Plan: 1. SOB with suspected acutely decompensated  CHF, interestingly, echocardiogram has not so any diastolic or systolic dysfunction. Likely patient has some short-term volume overload. Off of a very strong suspicion that some of his shortness of breath may be more from chronic aspiration. Followup BNP is down from 4000-2000. Has diuresed approximately 3 L Will increase Lasix and also check swallow evaluation. Given no fever or white count, we'll not start antibiotics yet  1. BNP of over 4,000 in ED 2. Will increase scheduled IV lasix 3. Monitor I/O, daily weights, and lytes closely 4. TSH normal 5. Still with coarse breath sounds 6. Would cont diuresis for now 2. HTN  1. Stable 2. Cont meds for now 3. Hypothyroid  1. Cont home meds 2. TSH normal 4. Hx CAD  1. Stable 2. Cont home meds, including antiplatelet 5. COPD  1. Stable 2. Cont on PRN breathing treatment 6. DVT prophylaxis  1. Heparin subQ  Code Status: Full Family Communication: No family listed. The patient has a caregiver. I have left a message her Disposition Plan: Home in the next few days. Have also asked physical therapy to see  HPI/Subjective: Patient doing okay. Still having some cough.  Objective: Filed Vitals:   02/22/13 0600 02/22/13 0902 02/22/13 1304 02/22/13 1457  BP: 104/68  125/82   Pulse: 82  81   Temp: 98 F (36.7 C)  97.7 F (36.5 C)   TempSrc: Oral  Oral   Resp: 18  18   Height:      Weight: 54.4 kg (119 lb 14.9 oz)     SpO2: 97% 93% 95% 99%    Intake/Output Summary (Last 24 hours) at 02/22/13 1545 Last data filed at 02/22/13 0830  Gross per 24 hour  Intake    800 ml  Output   1100 ml  Net   -300 ml   Filed Weights   02/20/13 1530 02/21/13 0600 02/22/13 0600  Weight: 54.4 kg (119 lb 14.9 oz) 54.7 kg (120 lb 9.5 oz) 54.4 kg (119 lb  14.9 oz)    Exam:   General:  Awake, in nad  Cardiovascular: Regular rate and rhythm, S1-S2, soft 2/6 systolic ejection murmur  Respiratory: Scattered rhonchi  Abdomen: soft, nondistended  Musculoskeletal: perfused, no clubbing   Data Reviewed: Basic Metabolic Panel:  Recent Labs Lab 02/20/13 1145 02/21/13 0507 02/22/13 0457  NA 132* 131* 130*  K 4.0 3.8 3.7  CL 95* 93* 91*  CO2 31 33* 36*  GLUCOSE 95 93 104*  BUN 17 19 18   CREATININE 0.61 0.59 0.59  CALCIUM 8.9 8.7 8.7   Liver Function Tests:  Recent Labs Lab 02/21/13 0507 02/22/13 0457  AST 14 13  ALT 8 7  ALKPHOS 67 74  BILITOT 0.5 0.5  PROT 5.5* 5.7*  ALBUMIN 2.3* 2.4*   No results found for this basename: LIPASE, AMYLASE,  in the last 168 hours No results found for this basename: AMMONIA,  in the last 168 hours CBC:  Recent Labs Lab 02/20/13 1145 02/21/13 0507  WBC 4.5 4.4  NEUTROABS 2.7  --   HGB 11.2* 10.9*  HCT 34.4* 33.2*  MCV 88.2 87.6  PLT 145* 156   Cardiac Enzymes: No results found for this basename: CKTOTAL, CKMB, CKMBINDEX, TROPONINI,  in the last 168 hours BNP (last 3 results)  Recent Labs  02/20/13 1145 02/22/13 0457  PROBNP 4269.0*  2609.0*   CBG: No results found for this basename: GLUCAP,  in the last 168 hours  No results found for this or any previous visit (from the past 240 hour(s)).   Studies: No results found.  Scheduled Meds: . albuterol  2.5 mg Nebulization Q6H  . feeding supplement (ENSURE COMPLETE)  237 mL Oral BID BM  . furosemide  40 mg Intravenous Q8H  . heparin  5,000 Units Subcutaneous Q8H   Continuous Infusions:   Active Problems:   HYPOTHYROIDISM   HYPERTENSION   CORONARY ARTERY DISEASE   Campath-induced atrial fibrillation   Chronic systolic heart failure   PERIPHERAL VASCULAR DISEASE   CHRONIC OBSTRUCTIVE PULMONARY DISEASE, ACUTE EXACERBATION   Protein-calorie malnutrition, severe  Time spent: 30 min  Hollice Espy  [facility] Pager [phone]. If 7PM-7AM, please contact night-coverage at [email or web address], password [facility] 02/22/2013, 3:45 PM  LOS: 2 days

## 2013-02-22 NOTE — Progress Notes (Addendum)
Met with patient at bedside to discuss and explain [facility] Care Management services. He states "I already have services like that". He was speaking his home health agency. He also has 24/7 care through [employer]. Patient reports he plans on returning home upon discharge. Patient could benefit from [facility] RN orders for CHF management upon discharge if he goes home with home health and not SNF. Spoke with inpatient RNCM to make aware of conversation with patient. Patient declined [facility] Care Management services at this time. Left [facility] Care Management brochure at bedside for him to call if he should be interested in the future. Appreciative of visit.  Raiford Noble, MSN-Ed, RN,BSN-- [facility] [phone]

## 2013-02-22 NOTE — Evaluation (Signed)
Physical Therapy Evaluation Patient Details Name: [name] MRN: [id number] DOB: 1917-12-04 Today's Date: 02/22/2013 Time: 4098-1191 PT Time Calculation (min): 16 min  PT Assessment / Plan / Recommendation History of Present Illness  Pt is a 79 y/o male with a hx of chronic systolic CHF, COPD, hypothyroid, and htn who presents to the ED with complaints of progressive SOB. Per caregiver at bedside, pt's diuretics were recently decreased 2-3 weeks prior. In the ED, the patient was noted to have a presenting BNP of over 4,000 with cxr findings suggestive of interstitial edema. The patient was given a dose of IV lasix with a breathing tx and the hospitalist was consulted for admission. Pt admitted with SOB and is s/p fall with L-sided rib fractures. No pneumothorax detected per imaging notes.  Clinical Impression  Pt admitted for above. Pt currently presenting with functional limitations due to deficits listed below (see PT Problem List). Pt would benefit from skilled PT to increase independence and safety during mobility and to allow d/c to venue listed below. Pt very motivated and willing to participate in PT. Pt able to ambulate 86' with RW and min guard for safety. PT recommends pt continue HHPT to increase strength, endurance and safety. Pt reports he has 24/7 nsg aides at home.    PT Assessment  Patient needs continued PT services    Follow Up Recommendations  Home health PT (pt reports he is currenlty receiving HHPT 2x/week.)    Does the patient have the potential to tolerate intense rehabilitation      Barriers to Discharge        Equipment Recommendations  None recommended by PT    Recommendations for Other Services     Frequency Min 3X/week    Precautions / Restrictions Precautions Precautions: Fall Precaution Comments: L-sided rib fractures and R calf wound-RN notified Restrictions Weight Bearing Restrictions: No   Pertinent Vitals/Pain Pt reports no pain at  rest with L elbow pain (reports 79 y/o elbow fracture) during mobility and R calf pain, but unable to rate.  Pt positioned to comfort at end of session.  RN notified.      Mobility  Bed Mobility Bed Mobility: Supine to Sit;Sitting - Scoot to Edge of Bed Supine to Sit: 4: Min assist Sitting - Scoot to Edge of Bed: 5: Supervision Details for Bed Mobility Assistance: assist to guide trunk into full upright position. VC's for hand placement and to not roll onto L-side due to rib fractures.  Transfers Transfers: Sit to Stand;Stand to Sit Sit to Stand: 4: Min guard;From bed;With upper extremity assist Stand to Sit: 4: Min guard;To chair/3-in-1;With upper extremity assist;With armrests Details for Transfer Assistance: min guard to ensure safety. VC's for hand placement. Ambulation/Gait Ambulation/Gait Assistance: 4: Min guard Ambulation Distance (Feet): 50 Feet Assistive device: Rolling walker Ambulation/Gait Assistance Details: min guard to ensures safety. pt required several standing rest breaks due to fatigue and to allow PT to examine R calf wound. Gait Pattern: Step-through pattern;Decreased stride length;Decreased dorsiflexion - left;Decreased dorsiflexion - right;Trunk flexed Gait velocity: decreased    Exercises     PT Diagnosis: Difficulty walking;Generalized weakness;Acute pain  PT Problem List: Decreased strength;Decreased activity tolerance;Decreased mobility;Decreased knowledge of use of DME PT Treatment Interventions: DME instruction;Gait training;Stair training;Functional mobility training;Therapeutic activities;Therapeutic exercise;Patient/family education     PT Goals(Current goals can be found in the care plan section) Acute Rehab PT Goals Patient Stated Goal: to go home PT Goal Formulation: With patient Time For Goal Achievement: 03/08/13  Potential to Achieve Goals: Good  Visit Information  Last PT Received On: 02/22/13 Assistance Needed: +1 History of Present  Illness: Pt is a 79 y/o male with a hx of chronic systolic CHF, COPD, hypothyroid, and htn who presents to the ED with complaints of progressive SOB. Per caregiver at bedside, pt's diuretics were recently decreased 2-3 weeks prior. In the ED, the patient was noted to have a presenting BNP of over 4,000 with cxr findings suggestive of interstitial edema. The patient was given a dose of IV lasix with a breathing tx and the hospitalist was consulted for admission. Pt admitted with SOB and is s/p fall with L-sided rib fractures. No pneumothorax detected per imaging notes.       Prior Functioning  Home Living Family/patient expects to be discharged to:: Private residence Living Arrangements: Spouse/significant other Available Help at Discharge: Personal care attendant;Available 24 hours/day Type of Home: House Home Access: Stairs to enter [employer] of Steps: 3 Entrance Stairs-Rails: Can reach both Home Layout: One level Home Equipment: Walker - 4 wheels;Shower seat;Grab bars - toilet;Grab bars - tub/shower;Toilet riser;Bedside commode Additional Comments: per chart: pt's wife has dementia and they both receive 24/7 care at home. Pt also reports he's receiving HHPT 2x/week. Pt reports he's on home O2. Prior Function Level of Independence: Needs assistance Comments: pt reports he requires some assistance for all ADLs and is able to ambulate with rollator ind. Communication Communication: No difficulties    Cognition  Cognition Arousal/Alertness: Awake/alert Behavior During Therapy: WFL for tasks assessed/performed Overall Cognitive Status: No family/caregiver present to determine baseline cognitive functioning Memory: Decreased short-term memory (pt does not recall falling or why he's in the hospital only stating "I'm here to take a nap.")    Extremity/Trunk Assessment Lower Extremity Assessment Lower Extremity Assessment: Generalized weakness   Balance    End of Session PT - End  of Session Equipment Utilized During Treatment: Oxygen (3L O2 [place]) Activity Tolerance: Patient tolerated treatment well Patient left: in chair;with call bell/phone within reach Nurse Communication: Mobility status;Other (comment) (RN notified of R calf wound)  GP     Sol Blazing 02/22/2013, 2:55 PM

## 2013-02-22 NOTE — Evaluation (Signed)
Clinical/Bedside Swallow Evaluation Patient Details  Name: BONNER LARUE MRN: [id number] Date of Birth: 06/02/1917  Today's Date: 02/22/2013 Time: 4696-2952 SLP Time Calculation (min): 44 min  Past Medical History:  Past Medical History  Diagnosis Date  . Abnormality of gait 09/28/2008  . Acquired deformity of chest and rib 01/14/2009  . Atrial fibrillation 09/30/2006  . BENIGN PROSTATIC HYPERTROPHY, WITH URINARY OBSTRUCTION 09/04/2009  . CHRONIC OBSTRUCTIVE PULMONARY DISEASE, ACUTE EXACERBATION 03/24/2007  . Chronic systolic heart failure 08/06/2008  . COPD 09/30/2006  . CORONARY ARTERY DISEASE 10/01/2006    stents placed [facility])  . GERD 04/19/2007  . HYPERTENSION 09/30/2006  . HYPOTHYROIDISM 09/30/2006  . LASSITUDE 06/13/2007  . Osteoarth NOS-Unspec 09/30/2006  . PEPTIC ULCER DISEASE 09/30/2006  . PERIPHERAL VASCULAR DISEASE 09/30/2006  . SPINAL STENOSIS 08/21/2009  . STROKE 08/28/2008  . Kyphoscoliosis    Past Surgical History:  Past Surgical History  Procedure Laterality Date  . Appendectomy    . Hernia repair    . Tonsillectomy    . Abdominal aortic aneurysm repair  [address] [place]  . Transurethral resection of prostate    . Lumbar laminectomy    . Laryngoscopy and esophagoscopy  02/10/2012    Procedure: LARYNGOSCOPY AND ESOPHAGOSCOPY;  Surgeon: Flo Shanks, MD;  Location: [facility];  Service: ENT;  Laterality: N/A;  DIRECT LARYNGOSCOPY/REMOVAL FOREIGN BODY FROM ESOPHAGUS  . Transthoracic echocardiogram  08/2011    EF 45-50%, mild conc LVH; LA severely dilated; RA mod dilated; mod calcif of MV; trace MR; mild TR, RVSP elevated; AV mod sclerotic; mild-mod valvular AS & mild regurg; trace pulm valve regurg  . Elbow surgery  2011  . Cardiac catheterization  2005    3.5x23 Cypher stent to prox & ostial RCA   HPI:  79 yo male adm to [facility] with shortness of breath, cough.  Pt with suspected decompensated heart failure - has h/o COPD, HTN, hypothyroidism, CAD,  dysphagia, GERD, PVD, Afib, peptic ulcer, spinal stenosis, DJD, BPH.  CXR 11/17 revealed 1. Interstitial infiltrate likely reflecting pulmonary edema. An infectious or inflammatory infiltrate clinically appropriate not be excluded. 2. Left pleural effusion which is increased in conspicuity 3. Lateral rib fractures on the left. Pt has h/o SILENT aspiration and reports using thickener at home with his drinks- (thickener with coffee and water but not with milk).    He does admit that he has frequent pnas. Chin tuck posture and dry swallows recommended per MBS 2012 are not used at home due to pt's "poor memory" and chin tuck posture not feeling "normal".    Pt states his current swallow ability is at baseline.      Assessment / Plan / Recommendation Clinical Impression  Pt presents with acute on chronic dysphagia - Congested weak cough observed without intake but was more prominant with po consumption- across consistencies tested.  Chin tuck posture and dry swallows found to be helpful on MBS in 2012 and subsequently recommended currently.  SLP reviewed previous MBS results/recommendations from 2012 with pt.  Also discussed previous "food impaction" in pharynx (at piriform sinus region- per imaging) in 02/2012 requiring removal- suspect due to CP dysfunction.      Advised pt to start meals with liquids due to xerostomia, ground his meats and add extra gravies/sauces avoiding particulate foods for maximal efficiency/airway protection with swallowing.  Pt is aware of his h/o dysphagia and admits to forgetting compensation strategies nor focusing when eating.  SLP discussed chronicity of dysphagia/aspiration  and aspiration mitigation strategies using teach back to assure comprehension.   Provided multiple handouts with precautions for pt.     Rec soft diet (pt lost lower dentures a few months ago) with ground meats and nectar thick liquid.  Also advise pt be allowed thin water between meals after oral care to  assure hydration is adequate.  Pt agreeable that repeat MBS not indicated at this time as will likely not change outcomes.  SLP will follow up x1 - Thanks for this consult.     Aspiration Risk    Moderate and ongoing, suspect chronic low grade silent aspiration   Diet Recommendation Nectar-thick liquid;Free water protocol after oral care;Dysphagia 3 (Mechanical Soft) (GROUND MEATS)   Liquid Administration via: Straw;Cup Medication Administration: Whole meds with puree (crush if large and not contraindicated) Supervision: Intermittent supervision to cue for compensatory strategies Compensations: Slow rate;Small sips/bites;Multiple dry swallows after each bite/sip Postural Changes and/or Swallow Maneuvers: Seated upright 90 degrees;Upright 30-60 min after meal (eat several small meals)    Other  Recommendations Oral Care Recommendations: Oral care BID Other Recommendations: Order thickener from pharmacy   Follow Up Recommendations       Frequency and Duration min 1 x/week  1 week   Pertinent Vitals/Pain Afebrile, decreased    SLP Swallow Goals  see care plan   Swallow Study Prior Functional Status  Type of Home: House Available Help at Discharge: Personal care attendant;Available 24 hours/day    General Date of Onset: 02/22/13 HPI: 79 yo male adm to [facility] with shortness of breath, cough.  Pt with suspected decompensated heart failure - has h/o COPD, HTN, hypothyroidism, CAD, dysphagia, GERD, PVD, Afib, peptic ulcer, spinal stenosis, DJD, BPH.  CXR 11/17 revealed 1. Interstitial infiltrate likely reflecting pulmonary edema. An infectious or inflammatory infiltrate clinically appropriate not be excluded. 2. Left pleural effusion which is increased in conspicuity 3. Lateral rib fractures on the left. Pt has h/o SILENT aspiration and reports using thickener at home with his drinks- (thickener with coffee and water but not with milk).    He does admit that he has frequent pnas. Chin tuck  posture and dry swallows recommended per MBS 2012 are not used at home due to pt's "poor memory" and chin tuck posture not feeling "normal".    Pt states his current swallow ability is at baseline.    Type of Study: Bedside swallow evaluation Previous Swallow Assessment: 04/04/2009 MBS Nectar/soft - + aspiration with cough, BSE, MBS 2012 silent aspiration of thin, penetration of nectar - rec soft/nectar with chin tuck and dry swallows Diet Prior to this Study: Regular;Thin liquids Temperature Spikes Noted: No Respiratory Status: Nasal cannula History of Recent Intubation: No Behavior/Cognition: Alert;Cooperative;Pleasant mood Oral Cavity - Dentition: Dentures, top (lower denture missing for a few months per pt) Self-Feeding Abilities: Able to feed self Patient Positioning: Upright in chair Baseline Vocal Quality: Low vocal intensity;Hoarse Volitional Cough: Weak (nonproductive) Volitional Swallow: Able to elicit    Oral/Motor/Sensory Function Overall Oral Motor/Sensory Function:  (no focal CN deficits, pt is weak)   Ice Chips   DNT  Thin Liquid Thin Liquid: Not tested Other Comments: pt with h/o SILENT aspiration of thin on previous MBS studies    Nectar Thick Nectar Thick Liquid: Impaired Presentation: Self Fed;Straw Oral Phase Impairments: Reduced lingual movement/coordination Oral phase functional implications: Prolonged oral transit Pharyngeal Phase Impairments: Cough - Delayed   Honey Thick Honey Thick Liquid: Not tested   Puree Puree: Impaired Presentation: Self Fed;Spoon Oral  Phase Impairments: Reduced lingual movement/coordination Oral Phase Functional Implications: Prolonged oral transit Pharyngeal Phase Impairments: Cough - Delayed   Solid   GO    Solid: Impaired Presentation: Self Fed Oral Phase Impairments: Reduced lingual movement/coordination;Impaired anterior to posterior transit (slow mastication) Pharyngeal Phase Impairments: Cough - Delayed       Donavan Burnet, MS [facility] SLP [phone]

## 2013-02-22 NOTE — Evaluation (Signed)
I have reviewed this note and agree with all findings. Kati Mathis Cashman, PT, DPT Pager: [phone]   

## 2013-02-23 DIAGNOSIS — R1310 Dysphagia, unspecified: Secondary | ICD-10-CM | POA: Diagnosis present

## 2013-02-23 DIAGNOSIS — J9610 Chronic respiratory failure, unspecified whether with hypoxia or hypercapnia: Secondary | ICD-10-CM

## 2013-02-23 DIAGNOSIS — R0902 Hypoxemia: Secondary | ICD-10-CM

## 2013-02-23 LAB — BASIC METABOLIC PANEL
CO2: 37 mEq/L — ABNORMAL HIGH (ref 19–32)
Chloride: 87 mEq/L — ABNORMAL LOW (ref 96–112)
Creatinine, Ser: 0.61 mg/dL (ref 0.50–1.35)
GFR calc Af Amer: >90 mL/min (ref >90)
Glucose, Bld: 105 mg/dL — ABNORMAL HIGH (ref 70–99)
Potassium: 3.5 mEq/L (ref 3.5–5.1)
Sodium: 131 mEq/L — ABNORMAL LOW (ref 135–145)

## 2013-02-23 LAB — PRO B NATRIURETIC PEPTIDE: Pro B Natriuretic peptide (BNP): 2594 pg/mL — ABNORMAL HIGH (ref 0–450)

## 2013-02-23 MED ORDER — RESOURCE THICKENUP CLEAR PO POWD: ORAL | Status: AC

## 2013-02-23 MED ORDER — FUROSEMIDE 40 MG PO TABS: ORAL_TABLET | ORAL | Status: AC

## 2013-02-23 NOTE — Clinical Documentation Improvement (Signed)
Possible Clinical Conditions?  Acute Respiratory Failure Acute on Chronic Respiratory Failure Chronic Respiratory Failure Acute Respiratory Insufficiency Acute Respiratory Insufficiency following surgery or trauma Other Condition Cannot Clinically Determine   Supporting Information: PMH:  COPD on oxygen 2L   Risk Factors:CHF, hypertension, stroke  Signs & Symptoms: per ED notes:"cough and shortness of breath, O2 sats 92%,  rales are audible during HPI, requests a breathing treatment. The symptoms have been progressively getting worse." He has wheezes. He has rales  Doc Flowsheets : nasal cannula  O2@ 3 Liters sats 93-99%  Diagnostics: BNP 4269 2 D Echo  Treatment:   albuterol (PROVENTIL) (2.5 MG/3ML) 0.083% nebulizer solution   budesonide-formoterol (SYMBICORT) 160-4.5 MCG/ACT inhaler   ipratropium (ATROVENT) 0.02 % nebulizer solution    Thank You, Andy Gauss ,RN Clinical Documentation Specialist:  [phone]  [facility]- Health Information Management

## 2013-02-23 NOTE — Discharge Summary (Signed)
Physician Discharge Summary  [name]:[id number] DOB: 10-17-1917 DOA: 02/20/2013  PCP: Rogelia Boga, MD  Admit date: 02/20/2013 Discharge date: 02/23/2013  Time spent: 25 minutes  Recommendations for Outpatient Follow-up:  1. Patient is being discharged on 40 mg by mouth Lasix for the next 4 days 2. Patient will follow up with his PCP Dr. Eleonore Chiquito, in the next 2 weeks 3. Home health RN & PT and speech therapy to follow patient at home  Discharge Diagnoses:  Active Problems:   HYPOTHYROIDISM: Stable issue, continued on Synthroid    HYPERTENSION: Stable. Patient continued on home meds    CORONARY ARTERY DISEASE: Stable medical issue.    Campath-induced atrial fibrillation, rate control, at times normal sinus rhythm    PERIPHERAL VASCULAR DISEASE: Stable medical issue.    CHRONIC OBSTRUCTIVE PULMONARY DISEASE, ACUTE EXACERBATION: Mild exacerbation set off from volume overload and microaspiration. Well controlled with nebulizers and oxygen and treating volume overload    Protein-calorie malnutrition, severe: Seen by nutrition. Started on supplement  Volume overload: Patient had an elevated BNP on admission, echocardiogram check noted normal systolic function with only trace or trivial valvular disorder. No evidence of diastolic dysfunction. He received IV Lasix and diuresed well over 3 L. Lungs were clear and he was feeling better and insisted on leaving the 11/20.  BNP went from 4400 down to 2200. Patient will be discharged on by mouth Lasix for the remaining few days. In addition, home health  Dysphagia: She still evaluated patient making recommendations of chin cup and 2 swallows liver bite, mouth rinse between meals, downgrading diet to dysphagia 3 with nectar thick. Following implementation, patient's lungs but the following day sounded much better.  Discharge Condition: Improved, being discharged to home  Diet recommendation: Dysphasia 3 diet  with nectar thick liquids, patient advised to do chin tuck and 2 swallows with every bite. Sips of water in between meals to clean out residual  [employer]   02/21/13 0600 02/22/13 0600 02/23/13 0612  Weight: 54.7 kg (120 lb 9.5 oz) 54.4 kg (119 lb 14.9 oz) 52.4 kg (115 lb 8.3 oz)    History of present illness:  79 year old white male past medical history of COPD atrial fibrillation and hypothyroidism who presented to the emergency room with complaints of several days of shortness of breath. His diuretics have been recently decreased a few weeks prior. BNP was noted to be over 4000 chest x-ray findings suggestive of interstitial edema. Patient was started on IV Lasix and responded well with a breathing treatment. He was admitted to the hospitalist service.  Hospital Course and Discharge Diagnoses:  Active Problems:   HYPOTHYROIDISM: Stable issue, continued on Synthroid    HYPERTENSION: Stable. Patient continued on home meds    CORONARY ARTERY DISEASE: Stable medical issue.    Campath-induced atrial fibrillation, rate control, at times normal sinus rhythm    PERIPHERAL VASCULAR DISEASE: Stable medical issue.    CHRONIC OBSTRUCTIVE PULMONARY DISEASE, ACUTE EXACERBATION: Mild exacerbation set off from volume overload and microaspiration. Well controlled with nebulizers and oxygen and treating volume overload    Protein-calorie malnutrition, severe: Seen by nutrition. Started on supplement  Volume overload: Patient had an elevated BNP on admission, echocardiogram check noted normal systolic function with only trace or trivial valvular disorder. No evidence of diastolic dysfunction. He received IV Lasix and diuresed well over 3 L. Lungs were clear and he was feeling better and insisted on leaving the 11/20.  BNP went from 4400 down to  2200. Patient will be discharged on by mouth Lasix for the remaining few days. In addition, home health  Dysphagia: She still evaluated patient making  recommendations of chin cup and 2 swallows liver bite, mouth rinse between meals, downgrading diet to dysphagia 3 with nectar thick. Following implementation, patient's lungs but the following day sounded much better.:    Procedures:  Echocardiogram done 11/17: Normal ejection fraction, no evidence of diastolic dysfunction  Consultations:  Speech therapy  Discharge Exam: Filed Vitals:   02/23/13 0612  BP: 112/63  Pulse: 83  Temp: 98 F (36.7 C)  Resp: 20    General: Alert and oriented x2, no acute distress Cardiovascular: At times irregular rhythm-rate controlled soft 2/6 systolic ejection murmur Respiratory: Decreased breath sounds throughout, however lungs much more clear today. No rails or wheezes  Discharge Instructions  Discharge Orders   Future Appointments Provider Department Dept Phone   06/23/2013 1:00 PM Gordy Savers, MD [facility] [phone]   Future Orders Complete By Expires   Other Restrictions  As directed    Comments:     Dysphagia diet with nectar thick liquids, tuck chin & 2 swallows with each bite of food.  Sips of water in between meals to clean out residual food.   Walk with assistance  As directed        Medication List         albuterol (2.5 MG/3ML) 0.083% nebulizer solution  Commonly known as:  PROVENTIL  Take 3 mL (2.5 mg total) by nebulization every 6 (six) hours as needed for wheezing.     budesonide-formoterol 160-4.5 MCG/ACT inhaler  Commonly known as:  SYMBICORT  Inhale 2 puffs into the lungs 2 (two) times daily.     carvedilol 12.5 MG tablet  Commonly known as:  COREG  Take 12.5 mg by mouth 2 (two) times daily with a meal.     clopidogrel 75 MG tablet  Commonly known as:  PLAVIX  Take 1 tablet (75 mg total) by mouth daily.     digoxin 0.125 MG tablet  Commonly known as:  LANOXIN  Take 0.125 mg by mouth daily.     dutasteride 0.5 MG capsule  Commonly known as:  AVODART  Take 0.5 mg by mouth at  bedtime.     finasteride 5 MG tablet  Commonly known as:  PROSCAR  Take 5 mg by mouth daily.     furosemide 40 MG tablet  Commonly known as:  LASIX  1 tablet for the next 4 days.     ipratropium 0.02 % nebulizer solution  Commonly known as:  ATROVENT  Take 0.5 mg by nebulization 2 (two) times daily.     levothyroxine 50 MCG tablet  Commonly known as:  SYNTHROID, LEVOTHROID  Take 50 mcg by mouth daily.     LORazepam 0.5 MG tablet  Commonly known as:  ATIVAN  Take 1 tablet (0.5 mg total) by mouth every 8 (eight) hours as needed. Anxiety     MYRBETRIQ 50 MG Tb24 tablet  Generic drug:  mirabegron ER  Take 50 mg by mouth daily.     pantoprazole 40 MG tablet  Commonly known as:  PROTONIX  Take 40 mg by mouth daily.     potassium chloride 10 MEQ tablet  Commonly known as:  K-DUR,KLOR-CON  Take 10 mEq by mouth 2 (two) times daily.     RESOURCE THICKENUP CLEAR Powd  Use with all liquids as directed to nectar thick consistency  spironolactone 25 MG tablet  Commonly known as:  ALDACTONE  Take 25 mg by mouth daily.     tamsulosin 0.4 MG Caps capsule  Commonly known as:  FLOMAX  Take 0.4 mg by mouth daily.     traMADol 50 MG tablet  Commonly known as:  ULTRAM  Take 1 tablet (50 mg total) by mouth every 6 (six) hours as needed for pain.       Allergies  Allergen Reactions  . Bleph-10 [Sulfacetamide Sodium] Other (See Comments)    Unknown   . Procaine Hcl     REACTION: syncope       Follow-up Information   Follow up with Rogelia Boga, MD In 2 weeks.   Specialty:  Internal Medicine   Contact information:   [address] [place] [address] [phone]        The results of significant diagnostics from this hospitalization (including imaging, microbiology, ancillary and laboratory) are listed below for reference.    Significant Diagnostic Studies: Dg Chest 2 View  02/20/2013    IMPRESSION: 1. Interstitial infiltrate likely  reflecting pulmonary edema. An infectious or inflammatory infiltrate clinically appropriate not be excluded. 2. Left pleural effusion which is increased in conspicuity 3. Lateral rib fractures on the left there is no evidence of a pneumothorax.   Electronically Signed   By: Salome Holmes M.D.   On: 02/20/2013 12:18    Microbiology: No results found for this or any previous visit (from the past 240 hour(s)).   Labs: Basic Metabolic Panel:  Recent Labs Lab 02/20/13 1145 02/21/13 0507 02/22/13 0457 02/23/13 0447  NA 132* 131* 130* 131*  K 4.0 3.8 3.7 3.5  CL 95* 93* 91* 87*  CO2 31 33* 36* 37*  GLUCOSE 95 93 104* 105*  BUN 17 19 18 15   CREATININE 0.61 0.59 0.59 0.61  CALCIUM 8.9 8.7 8.7 8.8   Liver Function Tests:  Recent Labs Lab 02/21/13 0507 02/22/13 0457  AST 14 13  ALT 8 7  ALKPHOS 67 74  BILITOT 0.5 0.5  PROT 5.5* 5.7*  ALBUMIN 2.3* 2.4*   No results found for this basename: LIPASE, AMYLASE,  in the last 168 hours No results found for this basename: AMMONIA,  in the last 168 hours CBC:  Recent Labs Lab 02/20/13 1145 02/21/13 0507  WBC 4.5 4.4  NEUTROABS 2.7  --   HGB 11.2* 10.9*  HCT 34.4* 33.2*  MCV 88.2 87.6  PLT 145* 156   BNP: BNP (last 3 results)  Recent Labs  02/20/13 1145 02/22/13 0457 02/23/13 0447  PROBNP 4269.0* 2609.0* 2594.0*   CBG: No results found for this basename: GLUCAP,  in the last 168 hours     Signed:  Hollice Espy  [facility] 02/23/2013, 10:48 AM

## 2013-02-23 NOTE — Progress Notes (Signed)
CARE MANAGEMENT NOTE 02/23/2013  Patient:  [name], [name]   Account Number:  [id number]  Date Initiated:  02/23/2013  Documentation initiated by:  [facility]  Subjective/Objective Assessment:   79 year old male admitted with COPD exacerbation.     Action/Plan:   From home with [facility] services and 24/7 caregivers. HH services are provided by [facility].   Anticipated DC Date:  02/23/2013   Anticipated DC Plan:  HOME W HOME HEALTH SERVICES      DC Planning Services  CM consult      [facility] Choice  Resumption Of Svcs/PTA Provider   Choice offered to / List presented to:       [facility] arranged  HH-2 PT  HH-1 RN      [facility] agency   [facility] Care   Status of service:  Completed, signed off  Medicare Important Message given?  NA - LOS <3 / Initial given by admissions   Discharge Disposition:  HOME W HOME HEALTH SERVICES  Per UR Regulation:  Reviewed for med. necessity/level of care/duration of stay

## 2013-03-01 ENCOUNTER — Telehealth: Payer: Self-pay | Admitting: Internal Medicine

## 2013-03-01 NOTE — Telephone Encounter (Signed)
[name] with [employer] is calling to report that pt's BP is running low,  She is currently at the home and the pt's BP is 102/60

## 2013-03-06 ENCOUNTER — Ambulatory Visit (INDEPENDENT_AMBULATORY_CARE_PROVIDER_SITE_OTHER): Payer: Medicare Other | Admitting: Internal Medicine

## 2013-03-06 ENCOUNTER — Encounter: Payer: Self-pay | Admitting: Internal Medicine

## 2013-03-06 VITALS — HR 70 | Temp 97.7°F | Resp 22

## 2013-03-06 DIAGNOSIS — I10 Essential (primary) hypertension: Secondary | ICD-10-CM

## 2013-03-06 DIAGNOSIS — I509 Heart failure, unspecified: Secondary | ICD-10-CM

## 2013-03-06 DIAGNOSIS — R1310 Dysphagia, unspecified: Secondary | ICD-10-CM

## 2013-03-06 DIAGNOSIS — I4891 Unspecified atrial fibrillation: Secondary | ICD-10-CM

## 2013-03-06 DIAGNOSIS — IMO0001 Reserved for inherently not codable concepts without codable children: Secondary | ICD-10-CM

## 2013-03-06 DIAGNOSIS — J449 Chronic obstructive pulmonary disease, unspecified: Secondary | ICD-10-CM

## 2013-03-06 DIAGNOSIS — I5022 Chronic systolic (congestive) heart failure: Secondary | ICD-10-CM

## 2013-03-06 DIAGNOSIS — R0602 Shortness of breath: Secondary | ICD-10-CM

## 2013-03-06 DIAGNOSIS — I2510 Atherosclerotic heart disease of native coronary artery without angina pectoris: Secondary | ICD-10-CM

## 2013-03-06 LAB — BRAIN NATRIURETIC PEPTIDE: Pro B Natriuretic peptide (BNP): 259 pg/mL — ABNORMAL HIGH (ref 0.0–100.0)

## 2013-03-06 LAB — BASIC METABOLIC PANEL
CO2: 33 mEq/L — ABNORMAL HIGH (ref 19–32)
Chloride: 93 mEq/L — ABNORMAL LOW (ref 96–112)
Creatinine, Ser: 0.7 mg/dL (ref 0.4–1.5)
Glucose, Bld: 99 mg/dL (ref 70–99)
Sodium: 133 mEq/L — ABNORMAL LOW (ref 135–145)

## 2013-03-06 MED ORDER — ASPIRIN EC 81 MG PO TBEC: 81.0000 mg | DELAYED_RELEASE_TABLET | Freq: Every day | ORAL | Status: AC

## 2013-03-06 NOTE — Progress Notes (Signed)
Subjective:    Patient ID: [name], male    DOB: 14-Jul-1917, 79 y.o.   MRN: [id number]  HPI Pre-visit discussion using our clinic review tool. No additional management support is needed unless otherwise documented below in the visit note.   Admit date: 02/20/2013  Discharge date: 02/23/2013  Time spent: 25 minutes  Recommendations for Outpatient Follow-up:  1. Patient is being discharged on 40 mg by mouth Lasix for the next 4 days 2. Patient will follow up with his PCP Dr. Eleonore Chiquito, in the next 2 weeks 3. Home health RN & PT and speech therapy to follow patient at home Discharge Diagnoses:  Active Problems:  HYPOTHYROIDISM: Stable issue, continued on Synthroid  HYPERTENSION: Stable. Patient continued on home meds  CORONARY ARTERY DISEASE: Stable medical issue.  Campath-induced atrial fibrillation, rate control, at times normal sinus rhythm  PERIPHERAL VASCULAR DISEASE: Stable medical issue.  CHRONIC OBSTRUCTIVE PULMONARY DISEASE, ACUTE EXACERBATION: Mild exacerbation set off from volume overload and microaspiration. Well controlled with nebulizers and oxygen and treating volume overload  Protein-calorie malnutrition, severe: Seen by nutrition. Started on supplement  Volume overload: Patient had an elevated BNP on admission, echocardiogram check noted normal systolic function with only trace or trivial valvular disorder. No evidence of diastolic dysfunction. He received IV Lasix and diuresed well over 3 L. Lungs were clear and he was feeling better and insisted on leaving the 11/20. BNP went from 4400 down to 2200. Patient will be discharged on by mouth Lasix for the remaining few days. In addition, home health  Dysphagia: She still evaluated patient making recommendations of chin cup and 2 swallows liver bite, mouth rinse between meals, downgrading diet to dysphagia 3 with nectar thick. Following implementation, patient's lungs but the following day sounded much  better.  Discharge Condition: Improved, being discharged to home  Diet recommendation: Dysphasia 3 diet with nectar thick liquids, patient advised to do chin tuck and 2 swallows with every bite. Sips of water in between meals to clean out residual  [employer]    02/21/13 0600  02/22/13 0600  02/23/13 0612   Weight:  54.7 kg (120 lb 9.5 oz)  54.4 kg (119 lb 14.9 oz)  52.4 kg (115 lb 8.3 oz)    History of present illness:   79 year-old white male past medical history of COPD atrial fibrillation and hypothyroidism who presented to the emergency room with complaints of several days of shortness of breath. His diuretics have been recently decreased a few weeks prior. BNP was noted to be over 4000 chest x-ray findings suggestive of interstitial edema. Patient was started on IV Lasix and responded well with a breathing treatment. He was admitted to the hospitalist service.   Since his discharge patient has reasonably well his only complaint today is a nonproductive cough. There has been no peripheral edema nor any increase shortness or breath. Blood pressure at home has been intermittently low with oxygen saturations in the low to mid 90s. He states that he is eating better  Wt Readings from Last 3 Encounters:  02/23/13 115 lb 8.3 oz (52.4 kg)  02/10/13 118 lb 1.6 oz (53.57 kg)  01/25/13 121 lb (54.885 kg)    Past Medical History  Diagnosis Date  . Abnormality of gait 09/28/2008  . Acquired deformity of chest and rib 01/14/2009  . Atrial fibrillation 09/30/2006  . BENIGN PROSTATIC HYPERTROPHY, WITH URINARY OBSTRUCTION 09/04/2009  . CHRONIC OBSTRUCTIVE PULMONARY DISEASE, ACUTE EXACERBATION 03/24/2007  . Chronic systolic  heart failure 08/06/2008  . COPD 09/30/2006  . CORONARY ARTERY DISEASE 10/01/2006    stents placed [facility])  . GERD 04/19/2007  . HYPERTENSION 09/30/2006  . HYPOTHYROIDISM 09/30/2006  . LASSITUDE 06/13/2007  . Osteoarth NOS-Unspec 09/30/2006  . PEPTIC ULCER DISEASE 09/30/2006  .  PERIPHERAL VASCULAR DISEASE 09/30/2006  . SPINAL STENOSIS 08/21/2009  . STROKE 08/28/2008  . Kyphoscoliosis     History   Social History  . Marital Status: Married    Spouse Name: N/A    Number of Children: 2  . Years of Education: N/A   Occupational History  . RETIRED     Surveyor and worked for [employer]  . WWII veteran    Social History Main Topics  . Smoking status: Former Smoker -- 0.80 packs/day for 20 years    Types: Cigarettes    Quit date: 04/06/1948  . Smokeless tobacco: Never Used  . Alcohol Use: No  . Drug Use: No  . Sexual Activity: No   Other Topics Concern  . Not on file   Social History Narrative   Lives at home with wife and has caretaker, still ambulatory daily with a walker. Had 2 children, 1 passed away.      Past Surgical History  Procedure Laterality Date  . Appendectomy    . Hernia repair    . Tonsillectomy    . Abdominal aortic aneurysm repair  [address], [place]  . Transurethral resection of prostate    . Lumbar laminectomy    . Laryngoscopy and esophagoscopy  02/10/2012    Procedure: LARYNGOSCOPY AND ESOPHAGOSCOPY;  Surgeon: Flo Shanks, MD;  Location: [facility];  Service: ENT;  Laterality: N/A;  DIRECT LARYNGOSCOPY/REMOVAL FOREIGN BODY FROM ESOPHAGUS  . Transthoracic echocardiogram  08/2011    EF 45-50%, mild conc LVH; LA severely dilated; RA mod dilated; mod calcif of MV; trace MR; mild TR, RVSP elevated; AV mod sclerotic; mild-mod valvular AS & mild regurg; trace pulm valve regurg  . Elbow surgery  2011  . Cardiac catheterization  2005    3.5x23 Cypher stent to prox & ostial RCA    Family History  Problem Relation Age of Onset  . Heart attack Mother   . Tuberculosis Father   . Heart Problems Maternal Grandmother   . Other Maternal Grandfather     stomach problems  . Heart Problems Sister     x2  . Meniere's disease Daughter     Allergies  Allergen Reactions  . Bleph-10 [Sulfacetamide Sodium] Other (See Comments)     Unknown   . Procaine Hcl     REACTION: syncope    Current Outpatient Prescriptions on File Prior to Visit  Medication Sig Dispense Refill  . albuterol (PROVENTIL) (2.5 MG/3ML) 0.083% nebulizer solution Take 3 mL (2.5 mg total) by nebulization every 6 (six) hours as needed for wheezing.  75 mL  12  . budesonide-formoterol (SYMBICORT) 160-4.5 MCG/ACT inhaler Inhale 2 puffs into the lungs 2 (two) times daily.      . carvedilol (COREG) 12.5 MG tablet Take 12.5 mg by mouth 2 (two) times daily with a meal.      . digoxin (LANOXIN) 0.125 MG tablet Take 0.125 mg by mouth daily.      Marland Kitchen dutasteride (AVODART) 0.5 MG capsule Take 0.5 mg by mouth at bedtime.       . finasteride (PROSCAR) 5 MG tablet Take 5 mg by mouth daily.      . furosemide (LASIX)  40 MG tablet 1 tablet for the next 4 days.  4 tablet  0  . ipratropium (ATROVENT) 0.02 % nebulizer solution Take 0.5 mg by nebulization 2 (two) times daily.      Marland Kitchen levothyroxine (SYNTHROID, LEVOTHROID) 50 MCG tablet Take 50 mcg by mouth daily.      Marland Kitchen LORazepam (ATIVAN) 0.5 MG tablet Take 1 tablet (0.5 mg total) by mouth every 8 (eight) hours as needed. Anxiety  30 tablet  1  . Maltodextrin-Xanthan Gum (RESOURCE THICKENUP CLEAR) POWD Use with all liquids as directed to nectar thick consistency  1 Can  1  . mirabegron ER (MYRBETRIQ) 50 MG TB24 tablet Take 50 mg by mouth daily.      . pantoprazole (PROTONIX) 40 MG tablet Take 40 mg by mouth daily.      . potassium chloride (K-DUR,KLOR-CON) 10 MEQ tablet Take 10 mEq by mouth 2 (two) times daily.      Marland Kitchen spironolactone (ALDACTONE) 25 MG tablet Take 25 mg by mouth daily.       . Tamsulosin HCl (FLOMAX) 0.4 MG CAPS Take 0.4 mg by mouth daily.      . traMADol (ULTRAM) 50 MG tablet Take 1 tablet (50 mg total) by mouth every 6 (six) hours as needed for pain.  15 tablet  0  . clopidogrel (PLAVIX) 75 MG tablet Take 1 tablet (75 mg total) by mouth daily.  90 tablet  3   No current facility-administered medications on  file prior to visit.    Pulse 70  Temp(Src) 97.7 F (36.5 C) (Oral)  Resp 22  SpO2 79%       Review of Systems  Constitutional: Positive for fatigue. Negative for fever, chills and appetite change.  HENT: Negative for congestion, dental problem, ear pain, hearing loss, sore throat, tinnitus, trouble swallowing and voice change.   Eyes: Negative for pain, discharge and visual disturbance.  Respiratory: Positive for shortness of breath. Negative for cough, chest tightness, wheezing and stridor.   Cardiovascular: Negative for chest pain, palpitations and leg swelling.  Gastrointestinal: Negative for nausea, vomiting, abdominal pain, diarrhea, constipation, blood in stool and abdominal distention.  Genitourinary: Negative for urgency, hematuria, flank pain, discharge, difficulty urinating and genital sores.  Musculoskeletal: Negative for arthralgias, back pain, gait problem, joint swelling, myalgias and neck stiffness.  Skin: Negative for rash.  Neurological: Positive for weakness. Negative for dizziness, syncope, speech difficulty, numbness and headaches.  Hematological: Negative for adenopathy. Does not bruise/bleed easily.  Psychiatric/Behavioral: Negative for behavioral problems and dysphoric mood. The patient is not nervous/anxious.        Objective:   Physical Exam  Constitutional: He is oriented to person, place, and time. He appears well-developed.  Appears weak and chronically ill Blood pressure 120/68  HENT:  Head: Normocephalic.  Right Ear: External ear normal.  Left Ear: External ear normal.  Eyes: Conjunctivae and EOM are normal.  Neck: Normal range of motion.  Cardiovascular: Normal rate and normal heart sounds.   Pulmonary/Chest:  Diminished breath sounds Kyphoscoliosis  Abdominal: Bowel sounds are normal.  Musculoskeletal: Normal range of motion. He exhibits no edema and no tenderness.  Neurological: He is alert and oriented to person, place, and time.    Psychiatric: He has a normal mood and affect. His behavior is normal.          Asessment & Plan:   Advanced COPD Chronic systolic heart failure Atrial fibrillation Chronic malnutrition  Appears to be euvolemic. We'll continue Aldactone and continue to  hold Lasix at this time. Followup cardiology Recheck here 4 weeks Hospital been ordered  [facility] check BNP and electrolytes

## 2013-03-06 NOTE — Patient Instructions (Signed)
Limit your sodium (Salt) intake  Return in one month for follow-up 

## 2013-03-08 ENCOUNTER — Telehealth: Payer: Self-pay | Admitting: Internal Medicine

## 2013-03-08 NOTE — Telephone Encounter (Signed)
[facility] needs written on a new RX for the hospital bed the dr's npi # and it must say semi-electric hospital bed. Salvatore Marvel is Nurse supervisor w/ [facility] Fax to [facility] @ [phone]

## 2013-03-08 NOTE — Telephone Encounter (Signed)
Spoke to Lupita Leash told her Rx for Semi-electric hospital bed order was faxed. Lupita Leash verbalized understanding.

## 2013-03-10 ENCOUNTER — Telehealth: Payer: Self-pay | Admitting: Internal Medicine

## 2013-03-10 DIAGNOSIS — I5022 Chronic systolic (congestive) heart failure: Secondary | ICD-10-CM

## 2013-03-10 DIAGNOSIS — J441 Chronic obstructive pulmonary disease with (acute) exacerbation: Secondary | ICD-10-CM

## 2013-03-10 DIAGNOSIS — J449 Chronic obstructive pulmonary disease, unspecified: Secondary | ICD-10-CM

## 2013-03-10 DIAGNOSIS — J9611 Chronic respiratory failure with hypoxia: Secondary | ICD-10-CM

## 2013-03-10 DIAGNOSIS — R531 Weakness: Secondary | ICD-10-CM

## 2013-03-10 DIAGNOSIS — I10 Essential (primary) hypertension: Secondary | ICD-10-CM

## 2013-03-10 DIAGNOSIS — I2510 Atherosclerotic heart disease of native coronary artery without angina pectoris: Secondary | ICD-10-CM

## 2013-03-10 NOTE — Telephone Encounter (Signed)
Patient's daughter requesting order for Hospice Care, pt is lethargic and not eating.  Nurse unsure if pt will survive through the weekend.  Please fax order to Hospice fax # [phone].

## 2013-03-10 NOTE — Telephone Encounter (Signed)
Agree.  Please order hospice

## 2013-03-10 NOTE — Telephone Encounter (Signed)
Spoke to [place] told her urgent referral done for Hospice for pt. [name] verbalized understanding and asked if I could send her an order signed so she has on hand. Told her sure will fax over. Order faxed to [phone].

## 2013-03-13 ENCOUNTER — Telehealth: Payer: Self-pay | Admitting: Internal Medicine

## 2013-03-13 NOTE — Telephone Encounter (Signed)
Please advise what medications can be discontinued.

## 2013-03-13 NOTE — Telephone Encounter (Signed)
Pt is new to hospice and would like to discontinue some of his medication that is not really needed

## 2013-03-13 NOTE — Telephone Encounter (Signed)
Continue nebulizer treatments with albuterol and Atrovent, lorazepam, Coreg, tramadol and Synthroid May discontinue other medications

## 2013-03-14 NOTE — Telephone Encounter (Signed)
Left message for [facility] nurse on voicemail to call office. Called pt's home and spoke to caregiver Lanora Manis asked her if hospice nurse was there? She said no. Told her I left a message for her to call me she wanted to know what medications can be stopped. Gave list to [place] of medications to continue but I will let Okey Regal know. Lanora Manis verbalized understanding and stated she is not allowed to change what is in his planner she will wait till Okey Regal changes it. Told her okay.

## 2013-03-14 NOTE — Telephone Encounter (Signed)
Left message on voicemail to call office.  

## 2013-03-15 NOTE — Telephone Encounter (Signed)
Spoke to [place] hosp[facility] nurse told her can continue nebulizer treatments with Albuterol and Atrovent, Lorazepam, Coreg, Tramadol and Synthroid. May discontinue rest of medications per Dr. Amador Cunas. Okey Regal verbalized understanding.

## 2013-03-20 ENCOUNTER — Ambulatory Visit: Payer: Self-pay | Admitting: Internal Medicine

## 2013-04-07 ENCOUNTER — Telehealth: Payer: Self-pay | Admitting: Internal Medicine

## 2013-04-07 NOTE — Telephone Encounter (Signed)
Pt expired at home on 04/21/2013 at 8:04 pm.

## 2013-04-07 NOTE — Telephone Encounter (Signed)
Noted, Dr. Kirtland BouchardK is aware.

## 2013-04-11 ENCOUNTER — Ambulatory Visit: Payer: Self-pay | Admitting: Internal Medicine

## 2013-05-07 DEATH — deceased

## 2013-06-23 ENCOUNTER — Ambulatory Visit: Payer: Self-pay | Admitting: Internal Medicine

## 2014-11-28 NOTE — Telephone Encounter (Signed)
Error/trying to close
